# Patient Record
Sex: Female | Born: 1940 | Race: White | Hispanic: No | Marital: Married | Smoking: Never smoker
Health system: Southern US, Community
[De-identification: ages and names within clinical notes are randomized; demographics above are authoritative.]

## PROBLEM LIST (undated history)

## (undated) DIAGNOSIS — J189 Pneumonia, unspecified organism: Secondary | ICD-10-CM

## (undated) DIAGNOSIS — F341 Dysthymic disorder: Secondary | ICD-10-CM

## (undated) DIAGNOSIS — K219 Gastro-esophageal reflux disease without esophagitis: Secondary | ICD-10-CM

## (undated) DIAGNOSIS — I10 Essential (primary) hypertension: Secondary | ICD-10-CM

## (undated) DIAGNOSIS — M797 Fibromyalgia: Secondary | ICD-10-CM

## (undated) DIAGNOSIS — T4145XA Adverse effect of unspecified anesthetic, initial encounter: Secondary | ICD-10-CM

## (undated) DIAGNOSIS — I219 Acute myocardial infarction, unspecified: Secondary | ICD-10-CM

## (undated) DIAGNOSIS — I350 Nonrheumatic aortic (valve) stenosis: Secondary | ICD-10-CM

## (undated) DIAGNOSIS — G459 Transient cerebral ischemic attack, unspecified: Secondary | ICD-10-CM

## (undated) DIAGNOSIS — I5031 Acute diastolic (congestive) heart failure: Principal | ICD-10-CM

## (undated) DIAGNOSIS — R112 Nausea with vomiting, unspecified: Secondary | ICD-10-CM

## (undated) DIAGNOSIS — Z9889 Other specified postprocedural states: Secondary | ICD-10-CM

## (undated) DIAGNOSIS — I2510 Atherosclerotic heart disease of native coronary artery without angina pectoris: Secondary | ICD-10-CM

## (undated) DIAGNOSIS — F419 Anxiety disorder, unspecified: Secondary | ICD-10-CM

## (undated) DIAGNOSIS — Z86711 Personal history of pulmonary embolism: Secondary | ICD-10-CM

## (undated) DIAGNOSIS — G8929 Other chronic pain: Secondary | ICD-10-CM

## (undated) DIAGNOSIS — I214 Non-ST elevation (NSTEMI) myocardial infarction: Secondary | ICD-10-CM

## (undated) DIAGNOSIS — T8859XA Other complications of anesthesia, initial encounter: Secondary | ICD-10-CM

## (undated) DIAGNOSIS — C8590 Non-Hodgkin lymphoma, unspecified, unspecified site: Secondary | ICD-10-CM

## (undated) HISTORY — DX: Personal history of pulmonary embolism: Z86.711

## (undated) HISTORY — PX: ABDOMINAL HYSTERECTOMY: SHX81

## (undated) HISTORY — DX: Other chronic pain: G89.29

## (undated) HISTORY — DX: Morbid (severe) obesity due to excess calories: E66.01

## (undated) HISTORY — PX: CHOLECYSTECTOMY: SHX55

## (undated) HISTORY — DX: Gastro-esophageal reflux disease without esophagitis: K21.9

## (undated) HISTORY — PX: GASTRIC BYPASS: SHX52

## (undated) HISTORY — PX: BLADDER SUSPENSION: SHX72

## (undated) HISTORY — PX: CARPAL TUNNEL RELEASE: SHX101

## (undated) HISTORY — DX: Dysthymic disorder: F34.1

## (undated) HISTORY — PX: BACK SURGERY: SHX140

## (undated) HISTORY — DX: Essential (primary) hypertension: I10

## (undated) HISTORY — DX: Atherosclerotic heart disease of native coronary artery without angina pectoris: I25.10

---

## 1999-02-08 ENCOUNTER — Encounter: Payer: Self-pay | Admitting: Pulmonary Disease

## 1999-02-08 ENCOUNTER — Encounter (INDEPENDENT_AMBULATORY_CARE_PROVIDER_SITE_OTHER): Payer: Self-pay | Admitting: Specialist

## 1999-02-08 ENCOUNTER — Ambulatory Visit (HOSPITAL_COMMUNITY): Admission: RE | Admit: 1999-02-08 | Discharge: 1999-02-08 | Payer: Self-pay | Admitting: Pulmonary Disease

## 1999-04-05 ENCOUNTER — Ambulatory Visit (HOSPITAL_COMMUNITY): Admission: RE | Admit: 1999-04-05 | Discharge: 1999-04-05 | Payer: Self-pay | Admitting: Pulmonary Disease

## 1999-04-05 ENCOUNTER — Encounter: Payer: Self-pay | Admitting: Pulmonary Disease

## 2001-08-10 ENCOUNTER — Other Ambulatory Visit: Admission: RE | Admit: 2001-08-10 | Discharge: 2001-08-10 | Payer: Self-pay | Admitting: General Surgery

## 2001-11-09 ENCOUNTER — Inpatient Hospital Stay (HOSPITAL_COMMUNITY): Admission: EM | Admit: 2001-11-09 | Discharge: 2001-11-11 | Payer: Self-pay | Admitting: Emergency Medicine

## 2001-11-09 ENCOUNTER — Encounter: Payer: Self-pay | Admitting: Neurology

## 2001-11-09 ENCOUNTER — Encounter: Payer: Self-pay | Admitting: Emergency Medicine

## 2001-11-10 ENCOUNTER — Encounter: Payer: Self-pay | Admitting: Neurology

## 2002-11-23 ENCOUNTER — Emergency Department (HOSPITAL_COMMUNITY): Admission: EM | Admit: 2002-11-23 | Discharge: 2002-11-23 | Payer: Self-pay

## 2004-06-25 ENCOUNTER — Inpatient Hospital Stay (HOSPITAL_COMMUNITY): Admission: EM | Admit: 2004-06-25 | Discharge: 2004-06-26 | Payer: Self-pay | Admitting: Emergency Medicine

## 2006-05-05 ENCOUNTER — Ambulatory Visit: Payer: Self-pay | Admitting: Cardiology

## 2006-10-16 ENCOUNTER — Encounter: Payer: Self-pay | Admitting: Cardiology

## 2006-10-17 ENCOUNTER — Ambulatory Visit: Payer: Self-pay | Admitting: Cardiology

## 2006-10-22 ENCOUNTER — Inpatient Hospital Stay (HOSPITAL_BASED_OUTPATIENT_CLINIC_OR_DEPARTMENT_OTHER): Admission: RE | Admit: 2006-10-22 | Discharge: 2006-10-22 | Payer: Self-pay | Admitting: Cardiology

## 2006-10-22 ENCOUNTER — Ambulatory Visit: Payer: Self-pay | Admitting: Cardiology

## 2006-11-04 ENCOUNTER — Ambulatory Visit: Payer: Self-pay | Admitting: Cardiology

## 2007-09-07 ENCOUNTER — Ambulatory Visit: Payer: Self-pay | Admitting: Cardiology

## 2007-12-01 ENCOUNTER — Ambulatory Visit: Payer: Self-pay | Admitting: Cardiology

## 2008-09-23 ENCOUNTER — Ambulatory Visit: Payer: Self-pay | Admitting: Physical Medicine & Rehabilitation

## 2008-09-23 ENCOUNTER — Inpatient Hospital Stay (HOSPITAL_COMMUNITY)
Admission: RE | Admit: 2008-09-23 | Discharge: 2008-10-15 | Payer: Self-pay | Admitting: Physical Medicine & Rehabilitation

## 2008-10-08 ENCOUNTER — Encounter: Payer: Self-pay | Admitting: Cardiology

## 2008-11-10 ENCOUNTER — Encounter
Admission: RE | Admit: 2008-11-10 | Discharge: 2008-11-10 | Payer: Self-pay | Admitting: Physical Medicine & Rehabilitation

## 2009-06-21 ENCOUNTER — Encounter: Admission: RE | Admit: 2009-06-21 | Discharge: 2009-08-10 | Payer: Self-pay | Admitting: Family Medicine

## 2009-08-14 ENCOUNTER — Encounter: Admission: RE | Admit: 2009-08-14 | Discharge: 2009-09-01 | Payer: Self-pay | Admitting: Family Medicine

## 2009-09-27 ENCOUNTER — Encounter: Payer: Self-pay | Admitting: Cardiology

## 2009-09-27 ENCOUNTER — Ambulatory Visit: Payer: Self-pay | Admitting: Cardiology

## 2009-09-28 ENCOUNTER — Encounter: Payer: Self-pay | Admitting: Cardiology

## 2009-10-02 ENCOUNTER — Encounter: Payer: Self-pay | Admitting: Cardiology

## 2009-10-05 ENCOUNTER — Encounter: Payer: Self-pay | Admitting: Cardiology

## 2009-10-10 ENCOUNTER — Encounter: Payer: Self-pay | Admitting: Cardiology

## 2009-10-24 ENCOUNTER — Ambulatory Visit: Payer: Self-pay | Admitting: Cardiology

## 2009-10-24 DIAGNOSIS — E119 Type 2 diabetes mellitus without complications: Secondary | ICD-10-CM | POA: Insufficient documentation

## 2009-10-24 DIAGNOSIS — R0602 Shortness of breath: Secondary | ICD-10-CM

## 2009-10-24 DIAGNOSIS — J159 Unspecified bacterial pneumonia: Secondary | ICD-10-CM | POA: Insufficient documentation

## 2009-10-24 DIAGNOSIS — Z9884 Bariatric surgery status: Secondary | ICD-10-CM

## 2009-10-24 DIAGNOSIS — R0789 Other chest pain: Secondary | ICD-10-CM

## 2009-10-24 DIAGNOSIS — Z8679 Personal history of other diseases of the circulatory system: Secondary | ICD-10-CM

## 2009-10-24 DIAGNOSIS — R6521 Severe sepsis with septic shock: Secondary | ICD-10-CM

## 2009-10-24 DIAGNOSIS — J9600 Acute respiratory failure, unspecified whether with hypoxia or hypercapnia: Secondary | ICD-10-CM

## 2009-10-24 DIAGNOSIS — IMO0001 Reserved for inherently not codable concepts without codable children: Secondary | ICD-10-CM | POA: Insufficient documentation

## 2009-10-24 DIAGNOSIS — R930 Abnormal findings on diagnostic imaging of skull and head, not elsewhere classified: Secondary | ICD-10-CM | POA: Insufficient documentation

## 2009-10-24 DIAGNOSIS — I10 Essential (primary) hypertension: Secondary | ICD-10-CM | POA: Insufficient documentation

## 2009-10-24 DIAGNOSIS — A419 Sepsis, unspecified organism: Secondary | ICD-10-CM | POA: Insufficient documentation

## 2009-10-24 DIAGNOSIS — E7800 Pure hypercholesterolemia, unspecified: Secondary | ICD-10-CM

## 2009-10-24 DIAGNOSIS — G43909 Migraine, unspecified, not intractable, without status migrainosus: Secondary | ICD-10-CM | POA: Insufficient documentation

## 2009-10-24 DIAGNOSIS — E876 Hypokalemia: Secondary | ICD-10-CM

## 2009-10-24 DIAGNOSIS — R6520 Severe sepsis without septic shock: Secondary | ICD-10-CM

## 2009-11-20 ENCOUNTER — Encounter (INDEPENDENT_AMBULATORY_CARE_PROVIDER_SITE_OTHER): Payer: Self-pay | Admitting: *Deleted

## 2010-05-09 ENCOUNTER — Ambulatory Visit: Payer: Self-pay | Admitting: Cardiology

## 2010-09-11 NOTE — Letter (Signed)
Summary: Engineer, materials at [facility]  [address]   [place], [place] [address]   Phone: [phone]  Fax: [phone]        November 20, 2009 MRN: [id number]   [facility] [address] [place], [place]  [address]   Dear Ms. Siek,  Your test ordered by Selena Batten has been reviewed by your physician (or physician assistant) and was found to be normal or stable. Your physician (or physician assistant) felt no changes were needed at this time.  ____ Echocardiogram  ____ Cardiac Stress Test  ____ Lab Work  ____ Peripheral vascular study of arms, legs or neck  __X__ CT scan or X-ray - chest   ____ Lung or Breathing test  ____ Other:   Thank you.   Hoover Brunette, LPN    Duane Boston, M.D., F.A.C.C. Thressa Sheller, M.D., F.A.C.C. Oneal Grout, M.D., F.A.C.C. Cheree Ditto, M.D., F.A.C.C. Daiva Nakayama, M.D., F.A.C.C. Kenney Houseman, M.D., F.A.C.C. Jeanne Ivan, PA-C

## 2010-09-11 NOTE — Assessment & Plan Note (Signed)
Summary: 6 MO FU PER SEPT REMINDER-SRS   Visit Type:  Follow-up Primary Provider:  Margo Common   History of Present Illness: tpatient is 70 year old female with a history of chronic back pain status post multiple prior back surgeries. She has a history of a false-positive Cardiolite stress study. Prior catheterization showed no evidence of obstructive coronary artery disease and the patient had normal LV function. She has a history of hypertension and is treated appropriately. 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 There is associated shortness of breath or chest pain. She denies any orthopnea PND.  Preventive Screening-Counseling & Management  Alcohol-Tobacco     Smoking Status: never  Current Medications (verified): 1)  Metoprolol Tartrate 25 Mg Tabs (Metoprolol Tartrate) .... Take 1 Tablet By Mouth Twice A Day 2)  Alprazolam 0.5 Mg Tabs (Alprazolam) .... Take 1 Tablet By Mouth Four Times A Day 3)  Neurontin 300 Mg Caps (Gabapentin) .... Take 2 Tablet By Mouth Three Times A Day 4)  Coumadin 5 Mg Tabs (Warfarin Sodium) .... Use As Directed 5)  Warfarin Sodium 1 Mg Tabs (Warfarin Sodium) .... Use As Directed 6)  Fentanyl 50 Mcg/hr Pt72 (Fentanyl) .... Apply Patch Every 3days For Pain 7)  Promethazine Hcl 25 Mg Tabs (Promethazine Hcl) .... Take One By Mouth Every Six Hours As Needed Nausea 8)  Flintstones/extra C  Chew (Pediatric Multi Vit-Extra C-Fa) .... Take 1 Tablet By Mouth Once A Day 9)  Citalopram Hydrobromide 20 Mg Tabs (Citalopram Hydrobromide) .... Take 1 Tablet By Mouth Once A Day 10)  Omeprazole 40 Mg Cpdr (Omeprazole) .... Take 1 Tablet By Mouth Once A Day 11)  Methocarbamol 500 Mg Tabs (Methocarbamol) .... Take One By Mouth Every Six Hours As Needed Muscle Spasms 12)  Furosemide 20 Mg Tabs (Furosemide) .... Take 1-2 Tablet By Mouth Once A Day 13)  Aldactone 25 Mg Tabs (Spironolactone) .... Take 1 Tablet  By Mouth Two Times A Day 14)  Potassium Chloride Cr 10 Meq Cr-Tabs (Potassium Chloride) .... Take 1-2 Tablet By Mouth Once A Day 15)  Nitrostat 0.4 Mg Subl (Nitroglycerin) .... Dissolve One Tablet Under Tongue For Severe Chest Pain As Needed Every 5 Minutes, Not To Exceed 3 in 15 Min Time Frame  Allergies (verified): 1)  ! Codeine  Comments:  Nurse/Medical Assistant: The patient's medication bottles and allergies were reviewed with the patient and were updated in the Medication and Allergy Lists.  Past History:  Past Medical History: Last updated: 10/24/2009 nonobstructive coronary artery disease by catheterization in 2008 negative Myoview in 2007 history of diabetes mellitus remote history of TIA history of dizziness with carotid Dopplers in 2007 showing no significant carotid artery disease remote history of gastric bypass surgery hyperlipidemia hypertension fibromyalgia chronic back pain status post two prior back surgeries with diaphoresis deafness in left ear limited mobility status-post hysterectomy.  Past Surgical History: Last updated: 10/24/2009 back surgery Abdominal Hysterectomy-Total Cardiac Catheterization Cholecystectomy Hx of gastric bypass knee replacement surgery Carpal tunnel surgery urinary bladder suspension surgery  Social History: Last updated: 10/24/2009 Married  Alcohol Use - no Drug Use - no worked long time in a [employer]  Risk Factors: Smoking Status: never (05/09/2010)  Review of Systems       The patient complains of chest pain, shortness of breath, muscle weakness, and joint pain.  The patient denies fatigue, malaise, fever, weight gain/loss, vision loss, decreased hearing, hoarseness, palpitations, prolonged cough, wheezing, sleep  apnea, coughing up blood, abdominal pain, blood in stool, nausea, vomiting, diarrhea, heartburn, incontinence, blood in urine, leg swelling, rash, skin lesions, headache, fainting, dizziness, depression,  anxiety, enlarged lymph nodes, easy bruising or bleeding, and environmental allergies.    Vital Signs:  Patient profile:   70 year old female Height:      67 inches Weight:      239 pounds Pulse rate:   81 / minute BP sitting:   103 / 69  (left arm) Cuff size:   regular  Vitals Entered By: Carlye Grippe (May 09, 2010 2:12 PM)  Physical Exam  Additional Exam:  General: Well-developed, well-nourished in no distress head: Normocephalic and atraumatic eyes PERRLA/EOMI intact, conjunctiva and lids normal nose: No deformity or lesions mouth normal dentition, normal posterior pharynx neck: Supple, no JVD.  No masses, thyromegaly or abnormal cervical nodes lungs: crackles and rhonchi in the left chest.  Right-sided chest exam normal.  Normal percussion heart: regular rate and rhythm with normal S1 and S2, no S3 or S4.  PMI is normal.  No pathological murmurs abdomen: Normal bowel sounds, abdomen is soft and nontender without masses, organomegaly or hernias noted.  No hepatosplenomegaly musculoskeletal: Back normal,patient sitting in wheelchair.  Limited strength in lower extremities pulsus: Pulse is normal in all 4 extremities Extremities: No peripheral pitting edema neurologic: Alert and oriented x 3 skin: Intact without lesions or rashes cervical nodes: No significant adenopathy psychologic: Normal affect    Impression & Recommendations:  Problem # 1:  CHEST PAIN, ATYPICAL (ICD-786.59) chest pain is atypical and although the patient has nonobstructive coronary artery diseasedescription of present coronary spasm and as a  trial of given a prescription of supplemental nitroglycerin. The following medications were removed from the medication list:    Metoprolol Tartrate 100 Mg Tabs (Metoprolol tartrate) .Marland Kitchen... Take 1 tablet by mouth once a day in morning Her updated medication list for this problem includes:    Metoprolol Tartrate 25 Mg Tabs (Metoprolol tartrate) .Marland Kitchen... Take 1  tablet by mouth twice a day    Coumadin 5 Mg Tabs (Warfarin sodium) ..... Use as directed    Warfarin Sodium 1 Mg Tabs (Warfarin sodium) ..... Use as directed    Nitrostat 0.4 Mg Subl (Nitroglycerin) .Marland Kitchen... Dissolve one tablet under tongue for severe chest pain as needed every 5 minutes, not to exceed 3 in 15 min time frame  Problem # 2:  ESSENTIAL HYPERTENSION, BENIGN (ICD-401.1) blood pressure is well controlled. Continue current medical therapy The following medications were removed from the medication list:    Metoprolol Tartrate 100 Mg Tabs (Metoprolol tartrate) .Marland Kitchen... Take 1 tablet by mouth once a day in morning Her updated medication list for this problem includes:    Metoprolol Tartrate 25 Mg Tabs (Metoprolol tartrate) .Marland Kitchen... Take 1 tablet by mouth twice a day    Furosemide 20 Mg Tabs (Furosemide) .Marland Kitchen... Take 1-2 tablet by mouth once a day    Aldactone 25 Mg Tabs (Spironolactone) .Marland Kitchen... Take 1 tablet by mouth two times a day  Patient Instructions: 1)  Nitroglycerin as needed for severe chest pain  2)  Referral to Dr. Jeral Fruit 3)  Follow up in  1 year Prescriptions: NITROSTAT 0.4 MG SUBL (NITROGLYCERIN) dissolve one tablet under tongue for severe chest pain as needed every 5 minutes, not to exceed 3 in 15 min time frame  #25 x 3   Entered by:   Hoover Brunette, LPN   Authorized by:   Lewayne Bunting, MD, [facility]  Signed by:   Hoover Brunette, LPN on 16/05/9603   Method used:   Electronically to        [facility] [address] (retail)       [address]       [place], [place]  [address]       Ph: [phone] or [phone]       Fax: [phone]   RxID:   [id number]   Appended Document: 6 MO FU PER SEPT REMINDER-SRS Referral to Dr. Jeral Fruit already taken care of per patient.

## 2010-09-11 NOTE — Consult Note (Signed)
Summary: [facility]IOLOG[facility] CONSULT/[facility]  CARDIOLOGY CONSULT/[facility]   Imported By: Zachary George 10/24/2009 11:41:38  _____________________________________________________________________  External Attachment:    Type:   Image     Comment:   External Document

## 2010-09-11 NOTE — Assessment & Plan Note (Signed)
Summary: [facility] 2/18   Visit Type:  Follow-up Primary Provider:  Margo Common  CC:  follow-up visit.  History of Present Illness: the patient is a 70 year old female recently admitted with bronchitis and pneumonia.  The patient has no prior history of coronary disease.  She had a negative cardiac catheterization 2008.  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  The patient now presents for follow-up after recent hospitalization.  The patient still reports left-sided chest pain sometimes on inspiration.  She denies any substernal central chest pain.  She still reports some occasional chills.  She denies any shortness of breath orthopnea PND.  She denies a cough.  Preventive Screening-Counseling & Management  Alcohol-Tobacco     Smoking Status: never  Current Medications (verified): 1)  Glucophage 500 Mg Tabs (Metformin Hcl) .... Take 1 Tablet By Mouth Two Times A Day 2)  Metoprolol Tartrate 100 Mg Tabs (Metoprolol Tartrate) .... Take 1 Tablet By Mouth Once A Day in Morning 3)  Metoprolol Tartrate 25 Mg Tabs (Metoprolol Tartrate) .... Take 1 Tablet By Mouth Once A Day At Bedtime 4)  Alprazolam 0.5 Mg Tabs (Alprazolam) .... As Needed 5)  Neurontin 300 Mg Caps (Gabapentin) .... Take 1 Tablet By Mouth Two Times A Day 6)  Coumadin 4 Mg Tabs (Warfarin Sodium) .... Use As Directed 7)  Warfarin Sodium 5 Mg Tabs (Warfarin Sodium) .... Use As Directed 8)  Oxycodone Hcl 5 Mg Tabs (Oxycodone Hcl) .... Take One By Mouth Every Six Hours As Needed Pain 9)  Fentanyl 50 Mcg/hr Pt72 (Fentanyl) .... Apply Patch Every 3days For Pain 10)  Promethazine Hcl 25 Mg Tabs (Promethazine Hcl) .... Take One By Mouth Every Six Hours As Needed Nausea 11)  Flintstones/extra C  Chew (Pediatric Multi Vit-Extra C-Fa) .... Take 1 Tablet By Mouth Once A Day  Allergies (verified): 1)  ! Codeine  Comments:  Nurse/Medical Assistant: The patient's medications and  allergies were reviewed with the patient and were updated in the Medication and Allergy Lists. Verbally gave names and doses.  Past History:  Past Surgical History: Last updated: 10/24/2009 back surgery Abdominal Hysterectomy-Total Cardiac Catheterization Cholecystectomy Hx of gastric bypass knee replacement surgery Carpal tunnel surgery urinary bladder suspension surgery  Family History: Last updated: 10/24/2009 Noncontributory  Social History: Last updated: 10/24/2009 Married  Alcohol Use - no Drug Use - no worked long time in a [employer]  Risk Factors: Smoking Status: never (10/24/2009)  Past Medical History: nonobstructive coronary artery disease by catheterization in 2008 negative Myoview in 2007 history of diabetes mellitus remote history of TIA history of dizziness with carotid Dopplers in 2007 showing no significant carotid artery disease remote history of gastric bypass surgery hyperlipidemia hypertension fibromyalgia chronic back pain status post two prior back surgeries with diaphoresis deafness in left ear limited mobility status-post hysterectomy.  Social History: Smoking Status:  never  Review of Systems       The patient complains of chest pain.  The patient denies fatigue, malaise, fever, weight gain/loss, vision loss, decreased hearing, hoarseness, palpitations, shortness of breath, prolonged cough, wheezing, sleep apnea, coughing up blood, abdominal pain, blood in stool, nausea, vomiting, diarrhea, heartburn, incontinence, blood in urine, muscle weakness, joint pain, leg swelling, rash, skin lesions, headache, fainting, dizziness, depression, anxiety, enlarged lymph nodes, easy bruising or bleeding, and environmental allergies.    Vital Signs:  Patient profile:   70 year old female Height:      19  inches Weight:      239 pounds BMI:     37.57 Pulse rate:   90 / minute BP sitting:   125 / 80  (right arm) Cuff size:   large  Vitals  Entered By: Carlye Grippe (October 24, 2009 1:34 PM)  Nutrition Counseling: Patient's BMI is greater than 25 and therefore counseled on weight management options. CC: follow-up visit   Physical Exam  Additional Exam:  General: Well-developed, well-nourished in no distress head: Normocephalic and atraumatic eyes PERRLA/EOMI intact, conjunctiva and lids normal nose: No deformity or lesions mouth normal dentition, normal posterior pharynx neck: Supple, no JVD.  No masses, thyromegaly or abnormal cervical nodes lungs: crackles and rhonchi in the left chest.  Right-sided chest exam normal.  Normal percussion heart: regular rate and rhythm with normal S1 and S2, no S3 or S4.  PMI is normal.  No pathological murmurs abdomen: Normal bowel sounds, abdomen is soft and nontender without masses, organomegaly or hernias noted.  No hepatosplenomegaly musculoskeletal: Back normal,patient sitting in wheelchair.  Limited strength in lower extremities pulsus: Pulse is normal in all 4 extremities Extremities: No peripheral pitting edema neurologic: Alert and oriented x 3 skin: Intact without lesions or rashes cervical nodes: No significant adenopathy psychologic: Normal affect    Impression & Recommendations:  Problem # 1:  NONSPCIFC ABN FINDING RAD & OTH EXAM LUNG FIELD (ICD-793.1) the patient continues to have an abnormal lung exam.  Chest crackles and rhonchi on the left side.  She also still reports left-sided chest pain.  I have ordered a follow-up chest x-ray Orders: T-Chest x-ray, 2 views ([id number])  Problem # 2:  CHEST PAIN, ATYPICAL (ICD-786.59) the patient's chest pain is atypical and is no evidence that this is related to ischemia.  No further ischemia workup is needed currently The following medications were removed from the medication list:    Aspir-low 81 Mg Tbec (Aspirin) .Marland Kitchen... Take 1 tablet by mouth once a day Her updated medication list for this problem includes:    Metoprolol  Tartrate 100 Mg Tabs (Metoprolol tartrate) .Marland Kitchen... Take 1 tablet by mouth once a day in morning    Metoprolol Tartrate 25 Mg Tabs (Metoprolol tartrate) .Marland Kitchen... Take 1 tablet by mouth once a day at bedtime    Coumadin 4 Mg Tabs (Warfarin sodium) ..... Use as directed    Warfarin Sodium 5 Mg Tabs (Warfarin sodium) ..... Use as directed  Problem # 3:  ESSENTIAL HYPERTENSION, BENIGN (ICD-401.1) blood pressures controlled and will continue current medical regimen. The following medications were removed from the medication list:    Aspir-low 81 Mg Tbec (Aspirin) .Marland Kitchen... Take 1 tablet by mouth once a day Her updated medication list for this problem includes:    Metoprolol Tartrate 100 Mg Tabs (Metoprolol tartrate) .Marland Kitchen... Take 1 tablet by mouth once a day in morning    Metoprolol Tartrate 25 Mg Tabs (Metoprolol tartrate) .Marland Kitchen... Take 1 tablet by mouth once a day at bedtime  Patient Instructions: 1)  chest x-ray 2)  Follow up in  6 months

## 2010-09-11 NOTE — Consult Note (Signed)
Summary: [facility]ULMONARY [facility]SULT/ [facility]  [facility]UL[facility]ARY [facility]/ [facility]   Imported By: Zachary George 10/24/2009 11:10:37  _____________________________________________________________________  External Attachment:    Type:   Image     Comment:   External Document

## 2010-11-22 LAB — PROTIME-INR
INR: 2.6 — ABNORMAL HIGH (ref 0.00–1.49)
INR: 2.8 — ABNORMAL HIGH (ref 0.00–1.49)
Prothrombin Time: 29.7 seconds — ABNORMAL HIGH (ref 11.6–15.2)
Prothrombin Time: 31.9 seconds — ABNORMAL HIGH (ref 11.6–15.2)

## 2010-11-27 LAB — URINALYSIS, ROUTINE W REFLEX MICROSCOPIC
Bilirubin Urine: NEGATIVE
Nitrite: NEGATIVE
Specific Gravity, Urine: 1.018 (ref 1.005–1.030)
Urobilinogen, UA: 1 mg/dL (ref 0.0–1.0)
pH: 5 (ref 5.0–8.0)

## 2010-11-27 LAB — BASIC METABOLIC PANEL
BUN: 24 mg/dL — ABNORMAL HIGH (ref 6–23)
BUN: 32 mg/dL — ABNORMAL HIGH (ref 6–23)
BUN: 14 mg/dL (ref 6–23)
BUN: 24 mg/dL — ABNORMAL HIGH (ref 6–23)
CO2: 30 mEq/L (ref 19–32)
CO2: 31 mEq/L (ref 19–32)
CO2: 26 mEq/L (ref 19–32)
CO2: 28 mEq/L (ref 19–32)
CO2: 30 mEq/L (ref 19–32)
Calcium: 9.1 mg/dL (ref 8.4–10.5)
Calcium: 9.4 mg/dL (ref 8.4–10.5)
Calcium: 8.8 mg/dL (ref 8.4–10.5)
Calcium: 9 mg/dL (ref 8.4–10.5)
Calcium: 9.1 mg/dL (ref 8.4–10.5)
Chloride: 94 mEq/L — ABNORMAL LOW (ref 96–112)
Chloride: 99 mEq/L (ref 96–112)
Chloride: 101 mEq/L (ref 96–112)
Chloride: 99 mEq/L (ref 96–112)
Creatinine, Ser: 0.9 mg/dL (ref 0.4–1.2)
Creatinine, Ser: 1.34 mg/dL — ABNORMAL HIGH (ref 0.4–1.2)
Creatinine, Ser: 0.69 mg/dL (ref 0.4–1.2)
Creatinine, Ser: 0.77 mg/dL (ref 0.4–1.2)
Creatinine, Ser: 0.98 mg/dL (ref 0.4–1.2)
GFR calc Af Amer: 48 mL/min — ABNORMAL LOW (ref >60)
GFR calc Af Amer: >60 mL/min (ref >60)
GFR calc Af Amer: >60 mL/min (ref >60)
GFR calc Af Amer: >60 mL/min (ref >60)
GFR calc Af Amer: >60 mL/min (ref >60)
GFR calc non Af Amer: 40 mL/min — ABNORMAL LOW (ref >60)
GFR calc non Af Amer: >60 mL/min (ref >60)
GFR calc non Af Amer: 57 mL/min — ABNORMAL LOW (ref >60)
GFR calc non Af Amer: >60 mL/min (ref >60)
GFR calc non Af Amer: >60 mL/min (ref >60)
Glucose, Bld: 108 mg/dL — ABNORMAL HIGH (ref 70–99)
Glucose, Bld: 133 mg/dL — ABNORMAL HIGH (ref 70–99)
Glucose, Bld: 107 mg/dL — ABNORMAL HIGH (ref 70–99)
Glucose, Bld: 108 mg/dL — ABNORMAL HIGH (ref 70–99)
Glucose, Bld: 111 mg/dL — ABNORMAL HIGH (ref 70–99)
Potassium: 4.3 mEq/L (ref 3.5–5.1)
Potassium: 4.7 mEq/L (ref 3.5–5.1)
Potassium: 4 mEq/L (ref 3.5–5.1)
Potassium: 5.2 mEq/L — ABNORMAL HIGH (ref 3.5–5.1)
Sodium: 134 mEq/L — ABNORMAL LOW (ref 135–145)
Sodium: 137 mEq/L (ref 135–145)
Sodium: 134 mEq/L — ABNORMAL LOW (ref 135–145)
Sodium: 136 mEq/L (ref 135–145)
Sodium: 137 mEq/L (ref 135–145)

## 2010-11-27 LAB — CBC
HCT: 35.3 % — ABNORMAL LOW (ref 36.0–46.0)
Hemoglobin: 12.1 g/dL (ref 12.0–15.0)
Hemoglobin: 11.1 g/dL — ABNORMAL LOW (ref 12.0–15.0)
MCHC: 34.1 g/dL (ref 30.0–36.0)
MCHC: 34.4 g/dL (ref 30.0–36.0)
MCV: 90.5 fL (ref 78.0–100.0)
Platelets: 173 10*3/uL (ref 150–400)
Platelets: 139 10*3/uL — ABNORMAL LOW (ref 150–400)
RBC: 3.9 MIL/uL (ref 3.87–5.11)
RDW: 14.8 % (ref 11.5–15.5)
RDW: 14.1 % (ref 11.5–15.5)
WBC: 5 10*3/uL (ref 4.0–10.5)

## 2010-11-27 LAB — WOUND CULTURE

## 2010-11-27 LAB — COMPREHENSIVE METABOLIC PANEL
ALT: 12 U/L (ref 0–35)
AST: 22 U/L (ref 0–37)
Albumin: 3.1 g/dL — ABNORMAL LOW (ref 3.5–5.2)
Alkaline Phosphatase: 92 U/L (ref 39–117)
BUN: 25 mg/dL — ABNORMAL HIGH (ref 6–23)
CO2: 34 mEq/L — ABNORMAL HIGH (ref 19–32)
Calcium: 9.6 mg/dL (ref 8.4–10.5)
Chloride: 99 mEq/L (ref 96–112)
Creatinine, Ser: 1.36 mg/dL — ABNORMAL HIGH (ref 0.4–1.2)
GFR calc Af Amer: 47 mL/min — ABNORMAL LOW (ref >60)
GFR calc non Af Amer: 39 mL/min — ABNORMAL LOW (ref >60)
Glucose, Bld: 119 mg/dL — ABNORMAL HIGH (ref 70–99)
Potassium: 4.5 mEq/L (ref 3.5–5.1)
Sodium: 139 mEq/L (ref 135–145)
Total Bilirubin: 0.7 mg/dL (ref 0.3–1.2)
Total Protein: 5.9 g/dL — ABNORMAL LOW (ref 6.0–8.3)

## 2010-11-27 LAB — PROTIME-INR
INR: 2.4 — ABNORMAL HIGH (ref 0.00–1.49)
INR: 2.5 — ABNORMAL HIGH (ref 0.00–1.49)
INR: 2.8 — ABNORMAL HIGH (ref 0.00–1.49)
INR: 1.8 — ABNORMAL HIGH (ref 0.00–1.49)
INR: 2 — ABNORMAL HIGH (ref 0.00–1.49)
INR: 3.5 — ABNORMAL HIGH (ref 0.00–1.49)
Prothrombin Time: 27.3 seconds — ABNORMAL HIGH (ref 11.6–15.2)
Prothrombin Time: 28.4 seconds — ABNORMAL HIGH (ref 11.6–15.2)
Prothrombin Time: 31.7 seconds — ABNORMAL HIGH (ref 11.6–15.2)
Prothrombin Time: 21.6 seconds — ABNORMAL HIGH (ref 11.6–15.2)
Prothrombin Time: 22.2 seconds — ABNORMAL HIGH (ref 11.6–15.2)
Prothrombin Time: 24.2 seconds — ABNORMAL HIGH (ref 11.6–15.2)
Prothrombin Time: 27.6 seconds — ABNORMAL HIGH (ref 11.6–15.2)
Prothrombin Time: 35.3 seconds — ABNORMAL HIGH (ref 11.6–15.2)
Prothrombin Time: 38.3 seconds — ABNORMAL HIGH (ref 11.6–15.2)

## 2010-11-27 LAB — DIFFERENTIAL
Basophils Absolute: 0 10*3/uL (ref 0.0–0.1)
Basophils Relative: 1 % (ref 0–1)
Lymphocytes Relative: 43 % (ref 12–46)
Monocytes Absolute: 0.3 10*3/uL (ref 0.1–1.0)
Neutro Abs: 1.3 10*3/uL — ABNORMAL LOW (ref 1.7–7.7)
Neutrophils Relative %: 42 % — ABNORMAL LOW (ref 43–77)

## 2010-11-27 LAB — URINE CULTURE

## 2010-11-27 LAB — HEMOGLOBIN A1C
Hgb A1c MFr Bld: 5.8 % (ref 4.6–6.1)
Mean Plasma Glucose: 120 mg/dL

## 2010-11-27 LAB — GLUCOSE, CAPILLARY: Glucose-Capillary: 161 mg/dL — ABNORMAL HIGH (ref 70–99)

## 2010-12-25 NOTE — Discharge Summary (Signed)
[name] [name], [name] [name]               ACCOUNT NO.:  [id number]   MEDICAL RECORD NO.:  [id number]          PATIENT TYPE:  IPS   LOCATION:  4009                         FACILITY:  [facility]   PHYSICIAN:  Ranelle Oyster, M.D.DATE OF BIRTH:  November 19, 1940   DATE OF ADMISSION:  09/23/2008  DATE OF DISCHARGE:  10/15/2008                               DISCHARGE SUMMARY   DISCHARGE DIAGNOSES:  1. Cauda equina syndrome - L3-S1 fusion with postop hematoma.  2. Hypertension.  3. Pulmonary embolism.  4. Fibromyalgia.  5. Neuropathy.   HISTORY OF PRESENT ILLNESS:  Ms. [name] [name] is a 70 year old female  with history of hypertension, fibromyalgia, prior back surgeries x2, and  recent increase in back pain with radiation to right lower extremity  greater than left lower extremity with worsening of balance.  MRI of L-  spine showed multilevel DDD with stenosis and instability.  The patient  elected to undergo L3-S1 fusion by Dr. Burtis Junes at [facility] on January 21.  On January 22, the patient was noted to have increased weakness in  bilateral lower extremity secondary to wound hematoma requiring  evacuation the same day.  Postop, was placed on IV Kefzol for wound  prophylaxis.  The patient did develop PE, requiring IVC filter placed on  January 27.  Bilateral lower extremity Dopplers were negative for DVT.  Once stabilized, she was started on IV heparin and has been transitioned  to Coumadin.  Therapy evaluation done revealed the patient with  impairments in mobility and self care.  After rehab assessment on  September 22, 2008, it was felt that she would benefit from an inpatient  rehab program and she was admitted to rehab today.   PAST MEDICAL HISTORY:  Significant for fibromyalgia, hypertension,  anemia, cholecystectomy, hysterectomy, history of gastric bypass, lumbar  lam in 1970s and 1990s, morbid obesity, restless legs syndrome, history  of low back pain, right lower extremity weakness, history of  remote GI  bleed.   REVIEW OF SYMPTOMS:  Significant for back wound that is intact; weakness  and numbness, bilateral lower extremity; issues with constipation.   ALLERGIES:  CODEINE.   FAMILY HISTORY:  Positive for cancer, diabetes, and coronary artery  disease.   SOCIAL HISTORY:  The patient is married, is retired from a factory.  Lives in [place] in a mobile home with 3 steps at entry.  Does not use any  tobacco or alcohol.  Husband and daughter are supportive and can provide  supervision past discharge.   FUNCTIONAL HISTORY:  The patient was independent and driving prior to  admission.   FUNCTIONAL STATUS:  The patient is +2 total assist with knees brace to  stand, minimal-to-moderate assist x2 for sliding board transfers,  minimal assist for bed mobility, requires maximum assist for lower  extremity care with use of adaptive equipment.   PHYSICAL EXAMINATION:  VITAL SIGNS:  Blood pressure 124/84, pulse 78,  respiratory rate 16.  The patient is afebrile.  GENERAL: The patient is pleasant, obese female, alert and oriented x3,  in no acute distress.  HEENT:  Pupils equal, round, and reactive  to light and accommodation.  Noted to be hard of hearing.  Moist oral mucosa noted with a full set of  dentures.  NECK:  Supple without JVD or lymphadenopathy.  CHEST:  Clear to auscultation bilaterally without wheezes, rales, or  rhonchi.  HEART:  Regular rate and rhythm without murmurs or gallops.  EXTREMITIES:  No clubbing, cyanosis.  No edema noted.  SKIN:  Back incision noted to be intact with mild irritation along  incision line.  No obvious breakdown noted.  NEUROLOGIC: Cranial nerves II through XII show decreased hearing.  Reflex is decreased in both lower extremities.  Sensation is 1 to 2  inconsistently below the waist, seems to be most effected on at L4, L5,  and S1 dermatome.  Strength is 5/5 in upper extremity.  Left lower  extremity strength is 2/5 proximal at hip and at  knee she has 1+ to 2/5  with ankle plantar flexion, trace ankle dorsiflexion on left; right  ankle plantar flexion is 1/5 and 0/5 ankle dorsiflexion.  She has 3-/5  strength at right knee and 2/5 at right hip.  Judgment, orientation,  memory, and mood are within functional limits.   HOSPITAL COURSE:  Ms. [name] [name] was admitted to rehab on September 23, 2008, for inpatient therapies to consider PT and OT at least 3  hours, 5 days a week.  Past admission physiatrist, rehab RN, and therapy  team have worked together to provide customized collaborative  interdisciplinary care.  The patient's Coumadin was maintained due to a  history of PE.  Pharmacy has been following and assisting with routine  check of labs to monitor the patient's INR and adjustment of Coumadin  dosing.  The patient's PT/INR at the time of discharge is at 31.9 at 2.8  respectively, and the patient is discharged on 4 mg Coumadin a day.  The  patient's vitals were monitored on b.i.d. basis during this stay.  The  patient was noted to have issues with hypotension initially.  Labs of  February 17 revealed the patient to be slightly dehydrated with BUN at  24, creatinine at 0.98.  Additionally, she was noted to have some  hypokalemia with potassium at 5.2.  The patient was treated with  Kayexalate.  Her lisinopril was discontinued and she was also started on  IV fluids to help with hydration issues and to help with hypotension  issues.  Additionally, the patient's Demadex was discontinued and TEDs  are being used for lower extremity edema control.  Due to continued  issues with low blood pressure, metoprolol was initially placed on hold.  As the patient's blood pressure stabilized out, IV fluids was changed to  nightly from February 17 to February 22.  At this time, blood pressures  were noted to be improved ranging from 90s to 110s in systolics.  As the  patient's mobility has improved, BP and heart rate were noted to be  on  an upward trend with heart rates in 80s-90s and systolics rising to  140s.  The patient's metoprolol has been resumed and has been slowly  titrated upwards to 50 mg b.i.d.Marland Kitchen  Blood pressures at the time of  discharge, ranging from 120s-150s systolics, 60s-80s diastolic.  Last  weight is at 111 kg.   Check of lytes, last of February 18, revealed sodium 136, potassium 4,  chloride 101, CO2 of 28, BUN 14, creatinine 0.7, glucose 108.  The  patient's incision has been monitored long closely by rehab RN.  She was  noted to develop some drainage with some redness and odor reported on  February 26.  She was noted to have some serosanguineous drainage, which  was cultured.  She was also started on Keflex 500 mg p.o. q.i.d.  empirically.  Temperature curve has been monitored long and she has had  no febrile episodes.  Additionally, a CBC with diff was checked on  February 27, showing hemoglobin 11.1, hematocrit 32.3, white count 3.1,  platelets 139, lymphocytes 43.  The patient's wound culture grew out a  few Staph aureus.  As the patient's wound drainage resolved within 48  hours, Keflex was discontinued on March 1.  The patient's wound remains  intact and currently is clean and dry without any evidence of erythema.   During the patient's stay in rehab, weekly team conferences were held to  monitor the patient's progress, set goals, as well as discuss barriers  to discharge.  Rehab RN has been assisting with bowel program.  The  patient was noted to have issues with constipation.  She was started on  MiraLax as well as FiberCon for bulking with suppositories and dig stim  being used every other day.  The patient's rehab RN has educated family  regarding bowel care, and they are comfortable in administering and  performing dig stim needed to help with bowel evacuation.  As mobility  improved, Foley was discontinued and bladder functioning was monitored  with PVR checks.  Initially, the  patient was noted to have problems  voiding, requiring in and out cath; however, within 48 hours, the  patient was noted to be voiding without any evidence of retention.  The  patient was noted to have issues with pain management at the time of  admission.  She reported having pain in 5-6 range most of the time.  Duragesic patch was added to help with more consistent pain relief.  Additionally, due to issues with neuropathy and spasms, Neurontin as  well was added and titrated to 300 mg t.i.d. as well as Pamelor added 10  mg nightly to help with her symptomatology.  She did report some issues  with fatigue and decreased endurance level initially with increasing  dose of Neurontin and has been instructed regarding side effect of meds  and the need to balance side effect with pain management.  The patient's  fentanyl patch was increased to 50 mcg an hour on February 27 due to  continued issues with pain.  The patient has been able to tolerate this  increase without any excessive sedation.  The patient's family has been  supportive and has been able to go through multiple family education  sessions with PT and OT.  At the time of admission, the patient was  noted to be impaired by decreased strength, decreased sensation, issues  with pain, as well as poor endurance.  She was total assist +2 for all  function, mobility, and transfers.  Physical therapy has worked with the  patient in helping increase endurance level with the use of knee step  for upper and lower body strengthening.  They have also been working on  standing and light gait as well as weight shifting and advancing of  lower extremity.  Core stabilization activities were also done sitting  at the edge of wheelchair for bouncing and as well as catching balls of  rebounder.  The patient has occasionally been limited by issues with  pain; however, rehab RN and therapy team have worked together  to  premedicate the patient prior to  therapy in these instances.  At the  time of discharge, the patient is modified independent for wheelchair  navigation.  She is at supervision level for supine to sitting.  She  requires moderate assist for sitting to supine.  She is at supervision  level for bed to wheelchair transfers.  She is able to perform car  transfers with sliding board.  Family education was done with husband as  well as daughter in safety with mobility equipment use, transfer  training.  Family has built a ramp for home entry and exit, and the  patient is able to navigate ramp with minimal assist.  A hospital bed  was also ordered to help assist with transfers and decrease caregiver  burden.  OT has been working with the patient regarding self care as  well as toileting.  The patient was noted to be at moderate assist for  toilet transfers, maximum assist for clothing management, and noted to  have some issues with self care initially.  ADL retraining has focused  at edge of bed on clothing management, with lateral leans for lower body  clothing.  The patient is currently able to use adaptive equipment to  perform grooming at sink level with supervision for transfers.  She is  able to focus on simple kitchen activities requiring minimal assist to  reach items of full shelf.  She does continue to require minimal assist  to pull up pants.  She is able to direct husband in her needs and in use  of adaptive equipment.  The patient continues to have difficulty with  toileting and often has chosen to use bedpan over transferring to  bedside commode and tub bench cutout.  Family will be able to provide  assistance as needed for self care.  The patient will additionally  continue to receive further followup home health, PT, OT by [facility].  Home health RN has been arranged for protime draws  with next protime to be checked on March 08, with results to Dr. Margo Common.  On October 15, 2008, the patient is  discharged to home with supervision  assistance to be provided by family.   DISCHARGE MEDICATIONS:  1. Coumadin 2 mg 2 p.o. q.p.m.  2. MiraLax 1 scoop in 8 ounces q.a.m.  3. Folic acid 1 mg p.o. per day.  4. Vitamin D 400 units 1 per day.  5. Mylicon 80 mg t.i.d. with meals.  6. Prilosec 40 mg per day.  7. Neurontin 300 mg p.o. q.8 h.  8. Lopressor 50 mg b.i.d.  9. FiberCon 2 pills per day in a.m.  10.Pamelor 10 mg 1 p.o. nightly.  11.Duragesic patch 50 mcg an hour, change q.72 h.  12.Robaxin 500 mg 1 p.o. q.6 h. p.r.n. spasms.  13.Oxycodone IR 5 mg 1 to 2 p.o. q.6-8 h. p.r.n. pain #90 RX.   DIET:  Regular.   WOUND CARE:  Wash area with soap and water.  Keep area clean and dry.   ACTIVITIES:  A 24 hours supervision.  No strenuous activity.  No  alcohol, no smoking, no driving.   SPECIAL INSTRUCTIONS:  Do not use torsemide or hydrocodone.  Dulcolax  suppository every other evening with dig stim.  [facility] to  provide PT, OT, and RN.   FOLLOWUP:  The patient to follow up with Dr. Riley Kill, April 7 at 9:45  a.m. or 10:00 a.m.  Follow up with Dr.  Sweasey for postop check.  Follow  up with Dr. Margo Common for routine check in 2 weeks.      Greg Cutter, P.A.      Ranelle Oyster, M.D.  Electronically Signed    PP/MEDQ  D:  10/14/2008  T:  10/15/2008  Job:  [id number]   cc:   Bartholomew Crews  Wyvonnia Lora

## 2010-12-25 NOTE — H&P (Signed)
[name] [name], [name] [name]               ACCOUNT NO.:  [id number]   MEDICAL RECORD NO.:  [id number]          PATIENT TYPE:  IPS   LOCATION:  4009                         FACILITY:  [facility]   PHYSICIAN:  Ranelle Oyster, M.D.DATE OF BIRTH:  1941-06-23   DATE OF ADMISSION:  09/23/2008  DATE OF DISCHARGE:                              HISTORY & PHYSICAL   CHIEF COMPLAINT:  Low back pain and leg weakness.   HISTORY OF PRESENT ILLNESS:  This is a 71 year old female with  hypertension and fibromyalgia with back surgeries x2 with recent  increase in back pain radiation into the right greater than left lower  extremities.  MRI of the spine revealed multiple level degenerative disk  disease with stenosis and instability.  She underwent L3-S1 fusion by  Dr. Burtis Junes at the [facility] on September 01, 2008.  On September 02, 2008, the patient with increased weakness of bilateral lower extremity  secondary to a hematoma requiring evacuation on the same day.  The  patient was placed on Kefzol postoperatively for wound prophylaxis.  The  patient developed a pulmonary embolism requiring IVC filter on September 07, 2008.  Lower extremity Dopplers were negative for DVT.  She has been  placed on IV heparin and transitioned to Coumadin.  The patient  continued to struggle with mobility and self-care.  She still has a  Foley catheter in place.  It was felt after rehab assessment on September 22, 2008, that she could benefit from rehab, and the patient was brought  here today.   REVIEW OF SYSTEMS:  Notable for leg pain and low back pain.  She has  numbness in her legs and weakness overall.  She reports some shortness  of breath.  Appetite is fair.  She denies any chest pain.  Sleep has  been good except for pain problems at night.  Full 14-point review is in  the written history and physical.   PAST MEDICAL HISTORY:  Positive for:  1. Fibromyalgia.  2. Hypertension.  3. Anemia.  4. Cholecystectomy.  5.  Hysterectomy.  6. Gastric bypass.  7. Lumbar laminectomy in the 70s and 90s.  8. Morbid obesity.  9. Restless legs syndrome.  10.Low back pain with right lower extremity weakness.  11.GI bleed.   FAMILY HISTORY:  Positive for cancer, diabetes, and CAD.   SOCIAL HISTORY:  The patient is married, retired from a factory, and  lives in [place] in a mobile home with 3 steps to enter.  Husband can  provide supervision at discharge.   ALLERGIES:  CODEINE.   HOME MEDICATIONS:  Xanax, Vicodin, metoprolol, aspirin, vitamin D, folic  acid, torsemide, Neurontin, and iron supplementation.   PHYSICAL EXAMINATION:  VITAL SIGNS:  Blood pressure is 124/84, pulse is  78, and respiratory rate 16.  She is afebrile.  GENERAL:  The patient is pleasant, lying in bed, alert and oriented x3.  HEENT:  Pupils are equally round and reactive to light and  accommodation.  Ear, nose, and throat exam is notable for pink mucosa  with full dentures.  NECK:  Supple without  JVD or lymphadenopathy.  CHEST:  Clear to auscultation bilaterally without wheezes, rales, or  rhonchi.  HEART:  Regular rate and rhythm without murmur, rubs, or gallops.  EXTREMITIES:  No clubbing, cyanosis, or edema.  ABDOMEN:  Soft, nontender.  Bowel sounds are positive.  SKIN:  Notable for intact back incision without any drainage and mild  irritation along the line of the wound.  I saw no obvious breakdown in  the heels, buttock area, etc.  NEUROLOGIC:  Cranial nerves II-XII are grossly intact.  Reflexes are  decreased in both legs.  Sensation is 1/2 inconsistently below the  waist.  She seems to be most affected along the L4-L5 and S1 dermatomes.  Strength is 5/5 in the upper extremities.  Left lower extremity strength  is 2/5 proximal at the hip and knee.  She has 1+ to 2/5 with ankle  plantar flexion, trace ankle dorsiflexion on the left.  Right ankle  plantar flexion is 1/5 with 0/5 ankle dorsiflexion.  She had 3 minus/5  strength at  the knee and 2/5 strength at the hip.  Judgment,  orientation, memory, mood were all within functional limits.  I might  add that she is slightly hard of hearing.   POST ADMISSION PHYSICIAN EVALUATION:  1. Functional deficits secondary to lumbar stenosis status post L3-S1      fusion with postoperative hematoma and likely cauda equina      syndrome.  2. The patient is admitted to receive collaborative interdisciplinary      care between the physiatrist, rehab, nursing staff, and therapy      team.  3. The patient's level of medical complexity and substantial therapy      needs in context of that medical necessity cannot be provided at a      lesser intensity of care.  4. The patient  has experienced substantial functional loss from her      baseline.  Prior to arrival, the patient was independent with a      cane for all function except occasional lower body dressing.  As of      the rehab preadmission screening, the patient is min assist bed      mobility, mod assist sliding board transfers, max assist sit to      stand with wheelchair or transfers.  Min assist upper body      dressing, total assist lower body dressing, min assist grooming.      Based on the diagnosis, physical exam, and functional history, the      patient has displayed the ability to make functional progress,      which result in measurable gains while on inpatient rehab.  These      gains will be of substantial and practical use upon discharge to      home where she will have supervision of her husband.  Interim      changes in the medical history since our screening are noted above.  5. Physiatrist will provide 24-hour management of medical needs, as      well as oversight of therapy plan/treatment, and provide guidance      as appropriate regarding interaction of the 2.  Medical problem      list and plan are listed below.  6. 24-hour rehab nursing will assist in the management of the      patient's bowel and  bladder continence.  She likely will need to be      put on bowel program  and need a intermittent cathing, scheduled      initially as we tried to improve her bladder function.  Nursing      will also help with skin care needs, as well as pain management,      nutrition, and integration of therapy concept, techniques, and      education.  7. PT will assess and treat for lower extremity strength, stability,      appropriate adaptive equipment, orthotics, pre gait and possibly      gait training and other functional mobility.  Goals are supervision      to occasional min assist with mobility.  8. OT will assess and treat for upper extremity use and adaptive      techniques, as well as safety measures, family education, and      implementation of ADLs.  Goals are supervision to occasional min      assist.  9. Case management/social worker will assess and treat for      psychosocial issues and discharge planning.  10.Team conference will be held weekly to assess progress towards      goals and to determine barriers at discharge.  11.The patient has had demonstrated sufficient medical stability and      exercise capacity to tolerate at least 3 hours of therapy per day      at least 5 days per week.  12.Estimated length of stay is 3 weeks.  Prognosis is good.   MEDICAL PROBLEM LIST AND PLAN:  1. Hypertension:  We will monitor blood pressure with therapy while on      Prinivil, Lopressor, and Demadex.  2. Fibromyalgia:  Continue vitamin D and folic acid as per baseline.  3. Pulmonary embolism:  Continue Coumadin with last INR therapeutic at      2.5.  Check INR upon admission.  4. Acute on chronic anemia:  We will check lab work on Monday versus      tomorrow.  No active signs of bleeding currently.  5. 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  Continue Vicodin for breakthrough symptoms.   6. Skin care:  Continue routine turning and observation.  7. Bowels:  Senokot 2 nightly with Dulcolax suppository q.p.m., as      well as needed for no BM.  Fleet enema if no movement with      suppository.      Ranelle Oyster, M.D.  Electronically Signed     ZTS/MEDQ  D:  09/23/2008  T:  09/24/2008  Job:  [id number]

## 2010-12-28 NOTE — H&P (Signed)
[name] [name], [name] [name]               ACCOUNT NO.:  [id number]   MEDICAL RECORD NO.:  [id number]          PATIENT TYPE:  INP   LOCATION:  A223                          FACILITY:  [facility]   PHYSICIAN:  Margaretmary Dys, M.D.DATE OF BIRTH:  [birth year]   DATE OF ADMISSION:  06/25/2004  DATE OF DISCHARGE:  LH                                HISTORY & PHYSICAL   ADMISSION DIAGNOSIS:  1.  Slurred speech.  2.  Transient ischemic attack.  3.  Poorly controlled hypertension.  4.  Morbid obesity.  5.  Dyslipidemia.   CHIEF COMPLAINT:  Slurred speech with right-sided facial numbness today.   HISTORY OF PRESENT ILLNESS:  [name] [name] is a 70year-old Caucasian female  who presented to the emergency room with complaints of slurred speech and  facial numbness on the right side of her face.  The patient says she has had  multiple episodes in the past and was told that she had transient ischemic  attack.  She reports that she also had some decreased taste in the back of  her throat when this attack occurs.  She has some difficulty swallowing.  She denies any headaches, dizziness or lightheadedness.  No nausea,  vomiting, abdominal pain or constipation.  She denies any syncopal attacks.  She said the symptoms were waxing and waning over several hours.  Once she  arrived in the emergency room, she said it was much better.  When her  daughter was present in this interview who saw her, she noticed that her  face was also pulled out to the right.  The patient denies chewing more on  the left side of her face.  She denies any drooling from the right side of  the mouth.  The patient does not have any insurance and has had difficulty  obtaining her medications.  She has not used any of her medications over the  last two weeks and she actually does not have a full list.  She did bring  some empty bottles of prescription medications.   She denies any fevers, chills or rigor.  She has no cough, no chest pain,  no  shortness of breath, no petechial edema.  No palpitations.   Evaluation in the emergency room revealed a negative CT of head.  There has  been no further workup.   REVIEW OF SYSTEMS:  A 10-point review of systems was negative except as  mentioned in the history of the present illness above.   PAST MEDICAL HISTORY:  1.  Hypertension.  2.  Dyslipidemia.  3.  Morbid obesity.  4.  History of recurrent transient ischemic attacks.  5.  History of multiple back surgeries.  6.  Myocardial infarction.  7.  Status post cholecystectomy.  8.  History of hysterectomy.   MEDICATIONS:  1.  She is a folic acid 1 mg p.o. daily.  2.  TriCor 48 mg p.o. daily.  3.  Nortriptyline 50 mg p.o. q.h.s.  4.  Xanax daily.  5.  Tylox.  6.  Aspirin 81 mg p.o. daily.  Note that the patient is poorly compliant.  ALLERGIES:  CODEINE (chest pain).   FAMILY HISTORY:  Noncontributory.   SOCIAL HISTORY:  The patient denies any use of tobacco products.  She does  not drink alcohol.  She currently lives with one of her children.  She has a  total of two children.  She is a retired Scientist, product/process development.   PHYSICAL EXAMINATION:  GENERAL:  She is alert.  She was comfortable, not in  acute distress.  She was alert and oriented to time, place and person.  VITAL SIGNS:  Blood pressure 159/69, pulse 78, respiratory rate of 20.  Pain  scale was 0/10  Oxygen saturation at 100% on room air.  Temperature was 97.5  degrees.  HEENT:  Atraumatic, normocephalic.  Oral mucosa was moist.  The patient has  a mild right facial droop.  No thrush was noted.  NECK:  Supple.  No JVD, no lymphadenopathy.  No carotid bruits were heard.  LUNGS:  Clear bilaterally.  HEART:  S1 and S2, regular rate and rhythm without murmurs, gallops or rubs.  ABDOMEN:  Obese, soft, nontender.  Bowel sounds were positive.  EXTREMITIES:  No pedal edema.  No calf tenderness was noted.  CENTRAL NERVOUS SYSTEM:  She is conscious and alert.  She had what  appeared  a right facial droop. There is decreased sensation on the right side of the  face.  The patient has not had any weakness in the extremities.  She has had  no pronator drift.  Reflexes are 2+ in all extremities.  Babinski was  negative.   LABORATORY DATA:  The patient had diagnostic studies done.  Cardiac enzymes  were negative.  White blood cell count 6.1, hemoglobin 10.6, hematocrit  31.4, platelet count 245,000; there was no left shift.  Sodium 141,  potassium 4.1, chloride 109, carbon dioxide 28, glucose of 107, BUN of 4.  Creatinine 0.8, calcium 9.1, total protein 6, albumin 3.6.  AST was 34, ALT  24, alkaline phosphatase 83, total bilirubin 0.2.  PT 12.5, INR 0.9.  Head  CT scan was negative.  Spiral chest CT was negative.  D-dimer was positive  at 1.64.   ASSESSMENT:  [name] [name] is a 70 year old Caucasian female who presented to  the emergency room with symptoms and signs fairly consistent with a  transient ischemic attack.  The patient has been poorly compliant with her  medications, especially aspirin which she has not taken for two weeks or  probably longer.  The patient has had attacks in the past and was advised to  stay on the aspirin.   PLAN:  The patient will be admitted to the telemetry unit at this time for  closer monitoring.  We will keep her on telemetry continuously.  EKG shows  normal sinus rhythm with no acute ST changes although the patient has old  inferior wall infarcts.  We will continue telemetry on her and keep her on  IV fluids, normal saline at 75 mL/H with clear liquids. If the patient shows  any evidence of aspiration, we will request a swallow study.   We will obtain a 2-D echocardiogram in the morning and also bilateral  carotid Dopplers.  She will also receive an MRI/MRA.  We will resume all the  patient's prior home medications at this time.  I will increase her aspirin to 325 mg p.o. daily.  DVT prophylaxis with Lovenox 40 mg subcu  daily.   I discussed the above plan with the patient who verbalized full  understanding.  The patient informed me that she has significant financial  difficulties and is hoping that a referral can be made to a Child psychotherapist.     Ayor   AM/MEDQ  D:  06/26/2004  T:  06/26/2004  Job:  [id number]

## 2010-12-28 NOTE — Discharge Summary (Signed)
[facility]  Patient:    [name] [name], [name] [name] Visit Number: [id number] MRN: [id number]          Service Type: MED Location: [id number] Attending Physician:  Erich Montane Dictated by:   Genene Churn. Love, M.D. Admit Date:  11/09/2001 Disc. Date: 11/11/01   CC:         Dr. Wyvonnia Lora   Discharge Summary  PATIENTS ADDRESS:  [address], [place], [place]  [address].  DATE OF BIRTH:  Dec 19, 1940.  REASON FOR ADMISSION:  This was the first [place] admission for this 70 year old right-handed married white female from [place], [place], admitted from the emergency room for evaluation of right perioral facial numbness, dysarthria and right facial weakness.  HISTORY OF PRESENT ILLNESS:  Ms. Gossard as been followed by Dr. Meredith Pel, neurologist at [facility], for many years, with a history of symptoms that now occur at a frequency of twice per year.  These are usually associated with facial numbness, dysarthria and facial weakness. They can last several days.  She has also had episodes of fluttering in her chest, thought to represent supraventricular tachycardia and by history, the possibility of atrial fibrillation though I have no documentation. She was on Coumadin for about two years up until five years ago and has not been on that medication since. She has a long history of suspected migraine headaches and a positive family history of headaches in that her daughter has migraine.  Two days prior to admission she developed right perioral and lower lip numbness associated with dysarthria and the night prior to admission, right facial weakness witnessed by her family and came to the emergency room on November 09, 2001, for evaluation.  PAST MEDICAL HISTORY:  This is significant for pain occurring in her back and both legs bilaterally. She had pain in her hips but does not use a cane and does not  use a walker.  She has had right and left carpal tunnel syndrome surgery, obesity, TIAs versus migraine, lumbar disc surgery x2 by Dr. Tresa Endo, a neurosurgeon at [facility].  Gastric bypass surgery 18 years ago. Question of supraventricular tachycardia versus atrial fibrillation five years ago but I do not have documentation.  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  ADMISSION MEDICATIONS: 1. No aspirin. 2. Xanax 0.5 mg q.i.d. 3. Toprol XL 50 mg q.d. 4. Vicodin three to four per day. 5. Tylenol two every other day for headache.  SOCIAL HISTORY:  She does not smoke cigarettes. She does not drink alcohol. She finished the ninth grade in school.  FAMILY HISTORY:  This is remarkable for diabetes mellitus, myocardial infarction, migraine headaches and a sister with fibromyalgia.  MEDICAL REVIEW OF SYSTEMS:  This is significant in that the patient has had obesity. She has had pain syndrome thought to represent fibromyalgia.  EXAMINATION PHYSICAL EXAMINATION:  VITAL SIGNS:  Blood pressure as high as 200/100 in the right arm and 200/110 in the left arm but she was somewhat anxious.  Otherwise, her neurologic examination was nonfocal except for decreased hearing bilaterally with bone conduction greater than air conduction.  LABORATORY STUDIES:  CAT scan with no evidence of abnormalities. There was a _____________ space in left basal ganglia region.  Hemoglobin 11.2, hematocrit 34.5, white blood cell count 5400, platelet count 224,000.  Differential on white blood cell count was 56% polys, 35% lymphs, 7% monocytes, 2%  eosinophils, 1% basophils.  Protime was 13.0 with INR of 1.0 and PTT was 23.  Serum sodium 140, potassium 4.0, chloride 107, CO2 content 28, glucose 116, BUN 6, creatinine 0.7, calcium 8.8.  Total protein and liver function tests were all unremarkable and a urinalysis was unremarkable.  EKG showed normal sinus rhythm, low  voltage QRS.  Telemetry showed normal sinus rhythm in the hospital.  She had some fluid in her sphenoid sinus on a CT scan but otherwise was normal study.  MRI study of the brain and intracranial MRA on November 10, 2001, was normal. Doppler study of the carotids showed no evidence of ICA stenosis and vertebral flow antegrade.  HOSPITAL COURSE: The patient had some numbness in the hospital with symptoms suggestive that this may be migraine related.  She had no worsening of her neurologic examination in the hospital.  A 2-D echocardiogram was not performed since her findings were thought not to be related to a heart problem with normal EKG in the hospital.  There was no evidence of any postural hypotension documented in the hospital.  IMPRESSION:  1. Right facial numbness, code 782.0.  2. Dysarthria, code 784.5.  3. Right facial weakness, code 434.01.  4. Headaches, code 784.0.  5. Obesity, code 278.01.  6. Fibromyalgia, code 729.1.  7. Back pain, status post two lumbar laminectomies; code 724.4.  8. Bilateral hip pain, code 719.45.  9. Gait disorder, code 781.2. 10. Decreased hearing, conductive type; code 389.00. 11. Possible history of atrial fibrillation in the past, code 437.301.  PLAN:  Discharge her on her admission medicines plus aspirin. I am considering the possibility of a migraine preventative as an outpatient but she does give a history of some renal stones, possibly in the past; therefore, I am hesitant to use Topamax at this time.  She is discharged improved from prehospital status and return on a p.r.n. basis should she have difficulties. Dictated by:   Genene Churn. Love, M.D. Attending Physician:  Erich Montane DD:  11/11/01 TD:  11/11/01 Job: [id number]

## 2010-12-28 NOTE — Procedures (Signed)
[name], [name] [name]               ACCOUNT NO.:  [id number]   MEDICAL RECORD NO.:  [id number]          PATIENT TYPE:  INP   LOCATION:  A223                          FACILITY:  [facility]   PHYSICIAN:  Dani Gobble, MD       DATE OF BIRTH:  10/11/1940   DATE OF PROCEDURE:  DATE OF DISCHARGE:  06/26/2004                                  ECHOCARDIOGRAM   REFERRING PHYSICIAN:  Dr. Orvan Falconer.   INDICATION:  [name] [name] is a 70 year old female who was admitted with a  possible stroke.   The technical quality of the study is reasonable.   The aorta is within normal limits at 2.8 cm.   The left atrium is borderline dilated at 4.2 cm.  No obvious clots or masses  were appreciated, and the patient appeared to be in sinus rhythm during this  procedure.   The interventricular septum and posterior wall were mild to moderately  thickened at 1.7 cm and 1.4 cm, respectively.   The aortic valve was not well visualized, but appeared to be trileaflet with  normal leaflet excursion.  It was mildly thickened, but without restriction  to leaflet opening.  No significant aortic insufficiency was noted.  Doppler  interrogation of the aortic valve is within normal limits.   The mitral valve also appears minimal thickened, but with normal leaflet  excursion.  No mitral valve prolapse is noted.  Trace to mild mitral  regurgitation was note.  Doppler interrogation of the mitral valve was  within normal limits.   The pulmonic valve was incompletely visualized but appeared to be grossly  structurally normal with trivial pulmonic insufficiency noted.   The tricuspid valve was not well visualized.  Mild tricuspid regurgitation  was noted.   The left ventricle was normal in size with the LVIDD measured at 4.3 cm and  the LVISD measured at 3.2 cm.  Overall, left ventricular systolic function  appeared normal and no obvious regional wall motion abnormalities were  appreciated.  The presence of diastolic  dysfunction is inferred from pulse  wave Doppler across the mitral valve.   The right atrium appeared normal in size, and the right ventricle appeared  mildly dilated with a suggestion of mild right ventricular hypertrophy.  Right ventricular systolic function was normal.   The interatrial septum is not well visualized, but there is no obvious  aneurysm, nor is there gross PFO or ASD noted.   IMPRESSION:  1.  Mild to moderate concentric left ventricular hypertrophy.  2.  Borderline dilatation of the left atrium.  3.  The patient appeared to be in sinus rhythm during this procedure.  4.  Trace to mild mitral regurgitation.  5.  Mild tricuspid regurgitation.  6.  Trivial pulmonic insufficiency.  7.  Normal left ventricular size and systolic function without regional wall      motion abnormality noted.  8.  The presence of diastolic dysfunction is inferred from pulse wave      Doppler across the mitral valve.  9.  The right ventricle appeared mildly dilated, but with normal right  ventricular systolic function and a suggestion of right ventricular      hypertrophy.  10. The interatrial septum is not well visualized, but there is no obvious      aneurysm, nor is there gross PFO or ASD noted.      AB/MEDQ  D:  06/26/2004  T:  06/26/2004  Job:  [id number]   cc:   Vania Rea, M.D.

## 2010-12-28 NOTE — Assessment & Plan Note (Signed)
[name]                          [name] [name]   NAME:[name], [name] FLICKER                      MRN:          [id number]  DATE:11/04/2006                            DOB:          08/16/1940    SUBJECTIVE:  [name] [name] for followup regarding recent  cardiac catheterization which took place on October 22, 2006, by Dr. Rollene Rotunda.  The patient was found to have minimal coronary plaque, normal  left ventricular ejection fraction; however, there was significant  ventricular ectopy during catheterization.  Dr. Antoine Poche did not feel  that it appeared to be significant valvular abnormality or other outflow  obstruction.  [name] [name] was felt to be non-cardiac.  She  actually saw Dr. Learta Codding at [name] prior to  catheterization for evaluation of atypical chest [name].  [name] [name] has  a history of non-obstructive coronary artery disease and underwent a  cardiac catheterization in the early 1990's at [name].  Dr.  Andee Lineman sent the patient for a repeat catheterization, secondary to  multiple risk factors.  He suspected that the patient's chest  discomfort, however, could be related to her fibromyalgia or possibly  gastroesophageal reflux disease.  In speaking with the patient [name], I  feel that her discomfort is most likely secondary to gastroesophageal  reflux disease/questionable gastroparesis.  She is telling me that she  has more frequent episodes of indigestion.  Apparently on two different  occasions she has experienced the sharp fleeting [name] in her sternal  area and has been followed by an episode of vomiting of a bile liquid,  that is eventually relieved when she sips on some Coke and burps.  She  is rather concerned about the most recent episode on Friday.  She was  out with her husband in the [name].  She had eaten a  bacon/lettuce/tomato sandwich.  When she got to the store, she had  the  chest discomfort once again.  She actually became nauseated and vomited  up her breakfast in the bathroom there and also continued to vomit  several more times throughout the morning, which was finally relieved  after she drank some Coke.  She denied being around anyone who had been  sick.  No fever or chills.  No abdominal discomfort.  No blood noted in  the emesis.  No diarrhea.  She states she has been fine since then, so  possibly may have had a gastritis or the episodes may also be related to  GI motility.   PAST MEDICAL HISTORY:  1. Nonobstructive coronary artery disease, as stated above, status      post stress Myoview in September 2007, which showed non-diagnostic      for ischemia, status post recent cardiac catheterization earlier      this month.  2. History of borderline diabetes; however, the patient is on      medication.  3. Significant ventricular ectopy during recent cardiac      catheterization.  4. Remote history of TIA.  The patient evaluated at the [name] for that.  5. Dizziness, status post carotid Dopplers in September 2007, showing      no significant plaque formation.  6. Remote history of gastric bypass surgery.  7. Hyperlipidemia.  8. Hypertension.  9. Fibromyalgia.  10.Chronic back [name], status post two prior back surgeries.  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  12.Limited mobility.  13.Status post hysterectomy.   REVIEW OF SYSTEMS:  As stated above.   ALLERGIES:  CODEINE AND BEES.   MEDICATIONS:  1. Aspirin 81 mg daily.  2. Glucophage 500 mg b.i.d.  3. Lopid 600 mg b.i.d.  4. Metoprolol 100 mg q.a.m. and 25 mg at bedtime.  5. Xanax 0.5 mg p.r.n.  6. Neurontin 300 mg q.h.s. for questionable peripheral diabetic      neuropathy.   PHYSICAL EXAMINATION:  VITAL SIGNS:  Blood pressure 170/74, heart rate  77, weight 242 pounds.  GENERAL:  [name] [name] is in no acute distress, a very pleasant and  cooperative 70 year old Caucasian female.   HEENT:  Pupils equal, round, reactive to light.  Sclerae clear.  NECK:  Supple without lymphadenopathy.  No bruits, no jugular venous  distention.  CARDIOVASCULAR:  S1 and S2, a regular rate and rhythm.  CHEST:  Clear to auscultation bilaterally.  ABDOMEN:  Soft, nontender.  Positive bowel sounds, obese.  EXTREMITIES:  Lower extremities without clubbing, cyanosis or edema.  NEUROLOGIC:  The patient is alert and oriented x3.  Cranial nerves II-  XII  grossly intact.   IMPRESSION:  1. Atypical chest [name], questionable gastroesophageal reflux      disease/esophageal spasms.  2. Recent episodes of vomiting, relieved with drinking Coke.  3. Obesity.  4. Diabetes.  5. Peripheral neuropathy.  6. Hypertension.   PLAN:  I have given samples of Nexium, to see if this relieves her chest  discomfort.  I feel she will probably need to be worked up by her  primary care physician and a possible GI workup, her chest discomfort, ,  being associated with the vomiting. The cardiac catheterization site to  right groin is stable.  No further cardiac workup needed at this time.   The patient is to follow up with her primary care physician, whom I  believe is Dr. Wyvonnia Lora.      Dorian Pod, ACNP  Electronically Signed      Learta Codding, MD,FACC  Electronically Signed   MB/MedQ  DD: 11/04/2006  DT: 11/04/2006  Job #: [id number]   cc:   Wyvonnia Lora

## 2010-12-28 NOTE — Cardiovascular Report (Signed)
[name] [name], [name] [name]               ACCOUNT NO.:  [id number]   MEDICAL RECORD NO.:  [id number]          PATIENT TYPE:  OIB   LOCATION:  1962                         FACILITY:  [facility]   PHYSICIAN:  Rollene Rotunda, MD, FACCDATE OF BIRTH:  18-Feb-1941   DATE OF PROCEDURE:  10/22/2006  DATE OF DISCHARGE:  10/22/2006                            CARDIAC CATHETERIZATION   PRIMARY:  Dr. Lia Hopping.   CARDIOLOGIST:  Dr. Lewayne Bunting.   PROCEDURE:  Left heart catheterization/coronary arteriography.   INDICATIONS:  Evaluate patient with chest pain and multiple  cardiovascular risk factors (786.51).   PROCEDURAL NOTE:  Left heart catheterization was performed via the right  femoral artery.  The artery was cannulated using anterior wall puncture.  A #4-French arterial sheath was inserted via the modified Seldinger  technique.  Preformed Judkins and a pigtail catheter were utilized.  The  patient tolerated the procedure well and left the lab in stable  condition.   RESULTS:   HEMODYNAMICS:  LV 186/22, AO 165/115.   CORONARIES:  The left main was normal.  The LAD had luminal  irregularities.  First diagonal was moderate and normal.  The circumflex  in the AV groove was normal.  First obtuse marginal was moderate and  normal.  Second obtuse marginal was large and normal.  Posterolateral  was large and normal.  The right coronary artery was dominant.  There  was proximal 25% stenosis.  The PDA was of moderate size and normal.  The posterolateral was small x2 and normal.   LEFT VENTRICULOGRAM:  The left ventriculogram was obtained in the RAO  projection.  The EF was 65%.   CONCLUSION:  Minimal coronary plaque.  Normal left ventricular function.  There did appear to be left ventricular outflow gradient.  However,  there was significant ventricular ectopy.  This needs to be put into  clinical context, as there does not appear to be significant valvular  abnormality or other outflow obstruction  per Dr. Margarita Mail note.   PLAN:  The patient will follow up Dr. Andee Lineman in about 10 days.  We will  continue to have the patient follow up with Dr. Olena Leatherwood for evaluation  of non-anginal chest pain.      Rollene Rotunda, MD, [facility]  Electronically Signed     JH/MEDQ  D:  10/22/2006  T:  10/24/2006  Job:  [id number]   cc:   Marcille Blanco, MD,FACC
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
## 2010-12-28 NOTE — Discharge Summary (Signed)
[name] [name], [name] [name]               ACCOUNT NO.:  [id number]   MEDICAL RECORD NO.:  [id number]          PATIENT TYPE:  INP   LOCATION:  A223                          FACILITY:  [facility]   PHYSICIAN:  Vania Rea, M.D. DATE OF BIRTH:  [birth year]   DATE OF ADMISSION:  06/25/2004  DATE OF DISCHARGE:  11/15/2005LH                                 DISCHARGE SUMMARY   PRIMARY CARE PHYSICIAN:  Dr. Wyvonnia Lora.   DISCHARGE DIAGNOSES:  1.  Recurrent episodic numbness of the right side of the face associated      with weakness of the right side of the face and slurring of the speech.  2.  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  3.  History of hypertension.  4.  Morbid obesity.  5.  History of dyslipidemia.   DISPOSITION:  Discharged to home.   DISCHARGE CONDITION:  Stable.   DISCHARGE MEDICATIONS:  The patient is to resume all of her outpatient  medications including aspirin.   HOSPITAL COURSE:  Please refer to admission history and physical.  This is a  70 year old Caucasian lady, a patient of Dr. Wyvonnia Lora, who for the past  eight years has had recurrent episodes of numbness of the face with  associated weakness and dizziness which has at various times been described  by neurologist as TIAs or migraine.  There seems to have been some  controversy among her neurologists as to what is the exact etiology, and she  has been evaluated by MRIs in the past which have been negative.  The  patient had another episode yesterday and was brought to the emergency room  where she was noted to have a right facial weakness and slurred speech.  The  patient was admitted with a diagnosis of TIA and a workup proceeded  accordingly.  The patient has had bilateral carotid Dopplers which show no significant  stenosis.  The patient has had an MRI and MRA of the brain which show no  significant stenosis and no acute abnormalities.  The patient has had a 2D  echocardiogram,  however, the report on that is pending.  Since the patient's vital signs are stable.  She is ambulating in the halls  of the hospital without difficulty.  She still complains of a slight  numbness of the right side of her face, but there is no objective weakness  of the extremities.  She does appear to have a very mild right facial  asymmetry but it is not apparent when she smiles.  This is considered to be  very unlikely to be a TIA.  It may possibly be a disorder related  specifically to the seventh nerve or affecting particularly the seventh  nerve.   We have recommended to the patient that she follow up with her primary  neurologist.  She has indicated that she has financial issues that may  prevent her having  immediate followup, and she was referred to her case  manager who has discussed options with her.  The patient is to give the case  manager a call tomorrow.  FOLLOW UP:  1.  Follow up with primary care physician, Dr. Margo Common, within 1-2 weeks.  2.  Follow up with her neurologist in [place] within 1-2 weeks.   SPECIAL INSTRUCTIONS:  She is to take her medications as prescribed.   PHYSICAL EXAMINATION:  VITAL SIGNS:  Her vitals this morning:  Her  temperature is 97.9, pulse 81, respirations 20, blood pressure 124/66.  GENERAL:  She was alert and oriented  x 3.  CHEST:  Clear to auscultation bilaterally.  CARDIOVASCULAR:  She had a regular rhythm.  ABDOMEN:  Soft and nontender.  EXTREMITIES:  She had no edema of the extremities.     [place]   LC/MEDQ  D:  06/26/2004  T:  06/26/2004  Job:  [id number]

## 2011-04-16 ENCOUNTER — Ambulatory Visit (INDEPENDENT_AMBULATORY_CARE_PROVIDER_SITE_OTHER): Payer: Medicare Other | Admitting: Urology

## 2011-04-16 ENCOUNTER — Other Ambulatory Visit: Payer: Self-pay | Admitting: Urology

## 2011-04-16 DIAGNOSIS — R82998 Other abnormal findings in urine: Secondary | ICD-10-CM

## 2011-04-16 DIAGNOSIS — R300 Dysuria: Secondary | ICD-10-CM

## 2011-04-16 DIAGNOSIS — R8281 Pyuria: Secondary | ICD-10-CM

## 2011-04-19 ENCOUNTER — Ambulatory Visit (HOSPITAL_COMMUNITY)
Admission: RE | Admit: 2011-04-19 | Discharge: 2011-04-19 | Disposition: A | Discharge status: Home / Self Care | Payer: Medicare Other | Source: Ambulatory Visit | Attending: Urology | Admitting: Urology

## 2011-04-19 DIAGNOSIS — N390 Urinary tract infection, site not specified: Secondary | ICD-10-CM | POA: Insufficient documentation

## 2011-04-19 DIAGNOSIS — R109 Unspecified abdominal pain: Secondary | ICD-10-CM | POA: Insufficient documentation

## 2011-04-19 DIAGNOSIS — R8281 Pyuria: Secondary | ICD-10-CM

## 2011-04-19 DIAGNOSIS — R82998 Other abnormal findings in urine: Secondary | ICD-10-CM | POA: Insufficient documentation

## 2011-05-14 ENCOUNTER — Ambulatory Visit (INDEPENDENT_AMBULATORY_CARE_PROVIDER_SITE_OTHER): Payer: Medicare Other | Admitting: Urology

## 2011-05-14 ENCOUNTER — Other Ambulatory Visit: Payer: Self-pay | Admitting: Urology

## 2011-05-14 DIAGNOSIS — R1031 Right lower quadrant pain: Secondary | ICD-10-CM

## 2011-05-14 DIAGNOSIS — R300 Dysuria: Secondary | ICD-10-CM

## 2011-05-14 DIAGNOSIS — R82998 Other abnormal findings in urine: Secondary | ICD-10-CM

## 2011-05-14 DIAGNOSIS — N952 Postmenopausal atrophic vaginitis: Secondary | ICD-10-CM

## 2011-05-16 ENCOUNTER — Ambulatory Visit (HOSPITAL_COMMUNITY)
Admission: RE | Admit: 2011-05-16 | Discharge: 2011-05-16 | Disposition: A | Discharge status: Home / Self Care | Payer: Medicare Other | Source: Ambulatory Visit | Attending: Urology | Admitting: Urology

## 2011-05-16 DIAGNOSIS — I10 Essential (primary) hypertension: Secondary | ICD-10-CM | POA: Insufficient documentation

## 2011-05-16 DIAGNOSIS — R112 Nausea with vomiting, unspecified: Secondary | ICD-10-CM | POA: Insufficient documentation

## 2011-05-16 DIAGNOSIS — R109 Unspecified abdominal pain: Secondary | ICD-10-CM | POA: Insufficient documentation

## 2011-05-16 DIAGNOSIS — R1031 Right lower quadrant pain: Secondary | ICD-10-CM

## 2011-05-16 DIAGNOSIS — E119 Type 2 diabetes mellitus without complications: Secondary | ICD-10-CM | POA: Insufficient documentation

## 2011-05-16 DIAGNOSIS — R599 Enlarged lymph nodes, unspecified: Secondary | ICD-10-CM | POA: Insufficient documentation

## 2011-05-16 DIAGNOSIS — J984 Other disorders of lung: Secondary | ICD-10-CM | POA: Insufficient documentation

## 2011-05-16 MED ORDER — IOHEXOL 300 MG/ML  SOLN
100.0000 mL | Freq: Once | INTRAMUSCULAR | Status: AC | PRN
  Administered 2011-05-16: 100 mL via INTRAVENOUS

## 2011-07-30 ENCOUNTER — Encounter: Payer: Self-pay | Admitting: Cardiology

## 2011-07-31 ENCOUNTER — Encounter: Payer: Self-pay | Admitting: Cardiology

## 2011-07-31 ENCOUNTER — Ambulatory Visit (INDEPENDENT_AMBULATORY_CARE_PROVIDER_SITE_OTHER): Payer: Medicare Other | Admitting: Cardiology

## 2011-07-31 VITALS — BP 103/69 | HR 73 | Ht 67.0 in | Wt 225.0 lb

## 2011-07-31 DIAGNOSIS — M549 Dorsalgia, unspecified: Secondary | ICD-10-CM | POA: Insufficient documentation

## 2011-07-31 DIAGNOSIS — I10 Essential (primary) hypertension: Secondary | ICD-10-CM

## 2011-07-31 DIAGNOSIS — R0789 Other chest pain: Secondary | ICD-10-CM

## 2011-07-31 MED ORDER — NITROGLYCERIN 0.4 MG/SPRAY TL SOLN: 1.0000 | Status: DC | PRN

## 2011-07-31 NOTE — Assessment & Plan Note (Signed)
Negative cardiac catheterization in 2008 the chest pain appears to be responsive to isosorbide mononitrate. He was given a prescription for nitroglycerin spray.

## 2011-07-31 NOTE — Assessment & Plan Note (Signed)
I referred the patient to Dr. Trey Sailors. She still has severe ongoing back pain requiring fentanyl patches.

## 2011-07-31 NOTE — Progress Notes (Signed)
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 The patient's main complaints are chronic back pain after multiple prior surgeries. She also has paralysis in both legs. Has a history of TIAs on Coumadin. She states that she is single episode of substernal chest pain approximately a week ago and took Imdur which has helped. The plan was to give her a prescription of sublingual nitroglycerin but she never received this. I told her that I was going to give her a nitroglycerin spray today. Otherwise from a cardiac standpoint she has done well. She denies any orthopnea PND palpitations or syncope.   PMH: reviewed and listed in Problem List in Electronic Records (and see below) Past Medical History  Diagnosis Date  . Other chest pain   . Other chronic pain   . Esophageal reflux   . Unspecified essential hypertension   . Personal history of pulmonary embolism   . Myalgia and myositis, unspecified   . Coronary atherosclerosis of native coronary artery     CATHETERIZATION X 3  . Dysthymic disorder   . Morbid obesity   . Type II or unspecified type diabetes mellitus without mention of complication, not stated as uncontrolled    Past Surgical History  Procedure Date  . Back surgery     X 2 FOR DISK PROBLEMS  . Abdominal hysterectomy   . Gastric bypass     HISTORY OF  . Replacement total knee   . Carpal tunnel release   . Bladder suspension     Allergies/SH/FHX : available in Electronic Records for review  Allergies  Allergen Reactions  . Codeine    History   Social History  . Marital Status: Married    Spouse Name: SAMUEL    Number of Children: N/A  .  Years of Education: N/A   Occupational History  . RETIRED    Social History Main Topics  . Smoking status: Never Smoker   . Smokeless tobacco: Never Used  . Alcohol Use: No  . Drug Use: No  . Sexually Active: Not on file   Other Topics Concern  . Not on file   Social History Narrative  . No narrative on file   Family History  Problem Relation Age of Onset  . Coronary artery disease      FAMILY HISTORY    Medications: Current Outpatient Prescriptions  Medication Sig Dispense Refill  . ALPRAZolam (XANAX) 1 MG tablet Take 1 mg by mouth 3 (three) times daily as needed.        . Cephalexin 250 MG tablet Take 250 mg by mouth at bedtime.        . fentaNYL (DURAGESIC - DOSED MCG/HR) 50 MCG/HR Place 1 patch onto the skin every 3 (three) days.        . furosemide (LASIX) 20 MG tablet Take 20 mg by mouth daily.        Marland Kitchen gabapentin (NEURONTIN) 300 MG capsule Take 900 mg by mouth 3 (three) times daily.       . methocarbamol (ROBAXIN) 750 MG tablet Take 750 mg by mouth 3 (three) times daily as needed.        . metoprolol tartrate (LOPRESSOR) 25 MG tablet Take 12.5 mg  by mouth 2 (two) times daily.        . Multiple Vitamin (MULTIVITAMIN) tablet Take 1 tablet by mouth daily.        Marland Kitchen omeprazole (PRILOSEC) 40 MG capsule Take 40 mg by mouth daily.        Marland Kitchen oxycodone (OXY-IR) 5 MG capsule Take 5 mg by mouth every 4 (four) hours as needed.        . potassium chloride (KLOR-CON) 10 MEQ CR tablet Take 10 mEq by mouth daily.        Marland Kitchen warfarin (COUMADIN) 4 MG tablet Take 4 mg by mouth as directed.        . warfarin (COUMADIN) 5 MG tablet Take 5 mg by mouth as directed.        . nitroGLYCERIN (NITROLINGUAL) 0.4 MG/SPRAY spray Place 1 spray under the tongue every 5 (five) minutes as needed for chest pain.  12 g  3    ROS: No nausea or vomiting. No fever or chills.No melena or hematochezia.No bleeding.No claudication  Physical Exam: BP 103/69  Pulse 73  Ht 5\' 7"  (1.702 m)  Wt 225 lb (102.059  kg)  BMI 35.24 kg/m2 General: Overweight white female sitting in a wheelchair Neck: Normal carotid upstroke no carotid bruits. No thyromegaly nonnodular thyroid. JVD 6 cm Lungs: Clear breath sounds bilaterally without wheezing Cardiac: Regular rate and rhythm with normal S1 and S2 no murmur rubs or gallops Vascular: No edema. Normal dorsalis and posterior tibial pulses Skin: Warm and dry Physcologic: Depressed affect  12lead ECG: Not performed Limited bedside ECHO:N/A   Assessment and Plan   Or

## 2011-07-31 NOTE — Assessment & Plan Note (Signed)
Blood pressure control. Continue current medical therapy 

## 2011-07-31 NOTE — Patient Instructions (Signed)
   Referral to Dr. Channing Mutters Your physician wants you to follow up in:  1 year.  You will receive a reminder letter in the mail one-two months in advance.  If you don't receive a letter, please call our office to schedule the follow up appointment

## 2011-08-08 ENCOUNTER — Other Ambulatory Visit: Payer: Self-pay | Admitting: *Deleted

## 2011-08-08 DIAGNOSIS — M549 Dorsalgia, unspecified: Secondary | ICD-10-CM

## 2011-08-16 ENCOUNTER — Other Ambulatory Visit: Payer: Self-pay | Admitting: Urology

## 2011-08-16 DIAGNOSIS — R599 Enlarged lymph nodes, unspecified: Secondary | ICD-10-CM

## 2011-08-20 ENCOUNTER — Ambulatory Visit (HOSPITAL_COMMUNITY)
Admission: RE | Admit: 2011-08-20 | Discharge: 2011-08-20 | Disposition: A | Discharge status: Home / Self Care | Payer: Medicare Other | Source: Ambulatory Visit | Attending: Urology | Admitting: Urology

## 2011-08-20 DIAGNOSIS — R161 Splenomegaly, not elsewhere classified: Secondary | ICD-10-CM | POA: Insufficient documentation

## 2011-08-20 DIAGNOSIS — J984 Other disorders of lung: Secondary | ICD-10-CM | POA: Insufficient documentation

## 2011-08-20 DIAGNOSIS — R1031 Right lower quadrant pain: Secondary | ICD-10-CM | POA: Insufficient documentation

## 2011-08-20 DIAGNOSIS — R599 Enlarged lymph nodes, unspecified: Secondary | ICD-10-CM | POA: Insufficient documentation

## 2011-08-20 LAB — CREATININE, SERUM: GFR calc Af Amer: 70 mL/min — ABNORMAL LOW (ref >90)

## 2011-08-20 MED ORDER — IOHEXOL 300 MG/ML  SOLN
100.0000 mL | Freq: Once | INTRAMUSCULAR | Status: AC | PRN
  Administered 2011-08-20: 100 mL via INTRAVENOUS

## 2011-08-27 ENCOUNTER — Ambulatory Visit (INDEPENDENT_AMBULATORY_CARE_PROVIDER_SITE_OTHER): Payer: Medicare Other | Admitting: Urology

## 2011-08-27 DIAGNOSIS — R1031 Right lower quadrant pain: Secondary | ICD-10-CM

## 2011-08-27 DIAGNOSIS — R599 Enlarged lymph nodes, unspecified: Secondary | ICD-10-CM

## 2011-08-27 DIAGNOSIS — N952 Postmenopausal atrophic vaginitis: Secondary | ICD-10-CM

## 2011-08-27 DIAGNOSIS — R300 Dysuria: Secondary | ICD-10-CM

## 2011-12-19 ENCOUNTER — Other Ambulatory Visit: Payer: Self-pay | Admitting: Urology

## 2011-12-19 DIAGNOSIS — R300 Dysuria: Secondary | ICD-10-CM

## 2012-01-24 ENCOUNTER — Ambulatory Visit (HOSPITAL_COMMUNITY): Admission: RE | Admit: 2012-01-24 | Payer: Medicare Other | Source: Ambulatory Visit

## 2012-01-28 ENCOUNTER — Ambulatory Visit: Payer: Medicare Other | Admitting: Urology

## 2012-05-26 ENCOUNTER — Ambulatory Visit (INDEPENDENT_AMBULATORY_CARE_PROVIDER_SITE_OTHER): Payer: Medicare Other | Admitting: Urology

## 2012-05-26 DIAGNOSIS — M545 Low back pain, unspecified: Secondary | ICD-10-CM

## 2012-05-26 DIAGNOSIS — R82998 Other abnormal findings in urine: Secondary | ICD-10-CM

## 2012-07-13 DIAGNOSIS — D649 Anemia, unspecified: Secondary | ICD-10-CM

## 2012-07-15 ENCOUNTER — Inpatient Hospital Stay (HOSPITAL_COMMUNITY)
Admission: AD | Admit: 2012-07-15 | Discharge: 2012-07-25 | DRG: 683 | Disposition: A | Discharge status: Home / Self Care | Payer: Medicare Other | Source: Other Acute Inpatient Hospital | Attending: Family Medicine | Admitting: Family Medicine

## 2012-07-15 DIAGNOSIS — Z9884 Bariatric surgery status: Secondary | ICD-10-CM

## 2012-07-15 DIAGNOSIS — F341 Dysthymic disorder: Secondary | ICD-10-CM | POA: Diagnosis present

## 2012-07-15 DIAGNOSIS — L0390 Cellulitis, unspecified: Secondary | ICD-10-CM | POA: Diagnosis present

## 2012-07-15 DIAGNOSIS — R0789 Other chest pain: Secondary | ICD-10-CM

## 2012-07-15 DIAGNOSIS — I10 Essential (primary) hypertension: Secondary | ICD-10-CM | POA: Diagnosis present

## 2012-07-15 DIAGNOSIS — G43909 Migraine, unspecified, not intractable, without status migrainosus: Secondary | ICD-10-CM

## 2012-07-15 DIAGNOSIS — Z86718 Personal history of other venous thrombosis and embolism: Secondary | ICD-10-CM

## 2012-07-15 DIAGNOSIS — N179 Acute kidney failure, unspecified: Principal | ICD-10-CM | POA: Diagnosis present

## 2012-07-15 DIAGNOSIS — I509 Heart failure, unspecified: Secondary | ICD-10-CM | POA: Diagnosis present

## 2012-07-15 DIAGNOSIS — Z7901 Long term (current) use of anticoagulants: Secondary | ICD-10-CM

## 2012-07-15 DIAGNOSIS — R0602 Shortness of breath: Secondary | ICD-10-CM

## 2012-07-15 DIAGNOSIS — A419 Sepsis, unspecified organism: Secondary | ICD-10-CM

## 2012-07-15 DIAGNOSIS — E119 Type 2 diabetes mellitus without complications: Secondary | ICD-10-CM | POA: Diagnosis present

## 2012-07-15 DIAGNOSIS — E7800 Pure hypercholesterolemia, unspecified: Secondary | ICD-10-CM

## 2012-07-15 DIAGNOSIS — R6521 Severe sepsis with septic shock: Secondary | ICD-10-CM

## 2012-07-15 DIAGNOSIS — Z8679 Personal history of other diseases of the circulatory system: Secondary | ICD-10-CM

## 2012-07-15 DIAGNOSIS — E039 Hypothyroidism, unspecified: Secondary | ICD-10-CM | POA: Diagnosis present

## 2012-07-15 DIAGNOSIS — K219 Gastro-esophageal reflux disease without esophagitis: Secondary | ICD-10-CM | POA: Diagnosis present

## 2012-07-15 DIAGNOSIS — D638 Anemia in other chronic diseases classified elsewhere: Secondary | ICD-10-CM | POA: Diagnosis present

## 2012-07-15 DIAGNOSIS — L02419 Cutaneous abscess of limb, unspecified: Secondary | ICD-10-CM | POA: Diagnosis present

## 2012-07-15 DIAGNOSIS — E876 Hypokalemia: Secondary | ICD-10-CM

## 2012-07-15 DIAGNOSIS — R930 Abnormal findings on diagnostic imaging of skull and head, not elsewhere classified: Secondary | ICD-10-CM

## 2012-07-15 DIAGNOSIS — Z6841 Body Mass Index (BMI) 40.0 and over, adult: Secondary | ICD-10-CM

## 2012-07-15 DIAGNOSIS — I2510 Atherosclerotic heart disease of native coronary artery without angina pectoris: Secondary | ICD-10-CM | POA: Diagnosis present

## 2012-07-15 DIAGNOSIS — L03119 Cellulitis of unspecified part of limb: Secondary | ICD-10-CM | POA: Diagnosis present

## 2012-07-15 DIAGNOSIS — IMO0001 Reserved for inherently not codable concepts without codable children: Secondary | ICD-10-CM

## 2012-07-15 DIAGNOSIS — J159 Unspecified bacterial pneumonia: Secondary | ICD-10-CM

## 2012-07-15 DIAGNOSIS — M549 Dorsalgia, unspecified: Secondary | ICD-10-CM

## 2012-07-15 DIAGNOSIS — Z86711 Personal history of pulmonary embolism: Secondary | ICD-10-CM

## 2012-07-15 DIAGNOSIS — J9600 Acute respiratory failure, unspecified whether with hypoxia or hypercapnia: Secondary | ICD-10-CM

## 2012-07-15 HISTORY — DX: Acute myocardial infarction, unspecified: I21.9

## 2012-07-15 HISTORY — DX: Fibromyalgia: M79.7

## 2012-07-15 HISTORY — DX: Gastro-esophageal reflux disease without esophagitis: K21.9

## 2012-07-15 HISTORY — DX: Anxiety disorder, unspecified: F41.9

## 2012-07-16 ENCOUNTER — Encounter (HOSPITAL_COMMUNITY): Payer: Self-pay

## 2012-07-16 ENCOUNTER — Observation Stay (HOSPITAL_COMMUNITY): Payer: Medicare Other

## 2012-07-16 DIAGNOSIS — N179 Acute kidney failure, unspecified: Secondary | ICD-10-CM | POA: Diagnosis present

## 2012-07-16 DIAGNOSIS — M549 Dorsalgia, unspecified: Secondary | ICD-10-CM

## 2012-07-16 DIAGNOSIS — Z86718 Personal history of other venous thrombosis and embolism: Secondary | ICD-10-CM

## 2012-07-16 DIAGNOSIS — J9600 Acute respiratory failure, unspecified whether with hypoxia or hypercapnia: Secondary | ICD-10-CM

## 2012-07-16 DIAGNOSIS — Z86711 Personal history of pulmonary embolism: Secondary | ICD-10-CM

## 2012-07-16 LAB — COMPREHENSIVE METABOLIC PANEL
ALT: <5 U/L (ref 0–35)
Alkaline Phosphatase: 97 U/L (ref 39–117)
CO2: 22 mEq/L (ref 19–32)
Calcium: 8.3 mg/dL — ABNORMAL LOW (ref 8.4–10.5)
GFR calc Af Amer: 10 mL/min — ABNORMAL LOW (ref >90)
GFR calc non Af Amer: 8 mL/min — ABNORMAL LOW (ref >90)
Glucose, Bld: 90 mg/dL (ref 70–99)
Sodium: 138 mEq/L (ref 135–145)
Total Bilirubin: 0.2 mg/dL — ABNORMAL LOW (ref 0.3–1.2)

## 2012-07-16 LAB — CBC
Hemoglobin: 9.7 g/dL — ABNORMAL LOW (ref 12.0–15.0)
MCHC: 31.1 g/dL (ref 30.0–36.0)
Platelets: 188 10*3/uL (ref 150–400)
RDW: 15.9 % — ABNORMAL HIGH (ref 11.5–15.5)

## 2012-07-16 LAB — CREATININE, URINE, RANDOM: Creatinine, Urine: 29.28 mg/dL

## 2012-07-16 LAB — URINALYSIS, ROUTINE W REFLEX MICROSCOPIC
Glucose, UA: NEGATIVE mg/dL
Ketones, ur: NEGATIVE mg/dL
Nitrite: NEGATIVE
Specific Gravity, Urine: 1.01 (ref 1.005–1.030)
pH: 5 (ref 5.0–8.0)

## 2012-07-16 LAB — GLUCOSE, CAPILLARY: Glucose-Capillary: 105 mg/dL — ABNORMAL HIGH (ref 70–99)

## 2012-07-16 LAB — URINE MICROSCOPIC-ADD ON

## 2012-07-16 LAB — SODIUM, URINE, RANDOM: Sodium, Ur: 78 mEq/L

## 2012-07-16 MED ORDER — INSULIN ASPART 100 UNIT/ML ~~LOC~~ SOLN
0.0000 [IU] | Freq: Three times a day (TID) | SUBCUTANEOUS | Status: DC
  Administered 2012-07-24: 1 [IU] via SUBCUTANEOUS

## 2012-07-16 MED ORDER — CITALOPRAM HYDROBROMIDE 20 MG PO TABS
20.0000 mg | ORAL_TABLET | Freq: Every day | ORAL | Status: DC
  Administered 2012-07-17 – 2012-07-25 (×9): 20 mg via ORAL
  Filled 2012-07-16 (×9): qty 1

## 2012-07-16 MED ORDER — ONDANSETRON HCL 4 MG/2ML IJ SOLN: 4.0000 mg | Freq: Four times a day (QID) | INTRAMUSCULAR | Status: DC | PRN

## 2012-07-16 MED ORDER — ALPRAZOLAM 0.5 MG PO TABS
1.0000 mg | ORAL_TABLET | Freq: Three times a day (TID) | ORAL | Status: DC | PRN
  Administered 2012-07-16: 0.5 mg via ORAL
  Filled 2012-07-16: qty 1

## 2012-07-16 MED ORDER — SODIUM CHLORIDE 0.9 % IV SOLN
250.0000 mL | INTRAVENOUS | Status: DC | PRN
  Administered 2012-07-19 – 2012-07-20 (×2): 250 mL via INTRAVENOUS

## 2012-07-16 MED ORDER — SODIUM CHLORIDE 0.9 % IJ SOLN: 3.0000 mL | INTRAMUSCULAR | Status: DC | PRN

## 2012-07-16 MED ORDER — ACETAMINOPHEN 325 MG PO TABS
650.0000 mg | ORAL_TABLET | Freq: Four times a day (QID) | ORAL | Status: DC | PRN
  Administered 2012-07-17 – 2012-07-19 (×2): 650 mg via ORAL
  Filled 2012-07-16 (×2): qty 2

## 2012-07-16 MED ORDER — GABAPENTIN 100 MG PO CAPS
100.0000 mg | ORAL_CAPSULE | Freq: Three times a day (TID) | ORAL | Status: DC
  Administered 2012-07-16 – 2012-07-25 (×29): 100 mg via ORAL
  Filled 2012-07-16 (×31): qty 1

## 2012-07-16 MED ORDER — CLINDAMYCIN PHOSPHATE 600 MG/50ML IV SOLN
600.0000 mg | Freq: Four times a day (QID) | INTRAVENOUS | Status: DC
  Administered 2012-07-16 – 2012-07-25 (×35): 600 mg via INTRAVENOUS
  Filled 2012-07-16 (×41): qty 50

## 2012-07-16 MED ORDER — INFLUENZA VIRUS VACC SPLIT PF IM SUSP
0.5000 mL | INTRAMUSCULAR | Status: AC
  Administered 2012-07-16: 0.5 mL via INTRAMUSCULAR
  Filled 2012-07-16: qty 0.5

## 2012-07-16 MED ORDER — WARFARIN SODIUM 4 MG PO TABS
4.0000 mg | ORAL_TABLET | Freq: Once | ORAL | Status: AC
  Administered 2012-07-16: 4 mg via ORAL
  Filled 2012-07-16: qty 1

## 2012-07-16 MED ORDER — ALPRAZOLAM 0.5 MG PO TABS
0.5000 mg | ORAL_TABLET | Freq: Three times a day (TID) | ORAL | Status: DC | PRN
  Administered 2012-07-17 – 2012-07-24 (×16): 0.5 mg via ORAL
  Filled 2012-07-16 (×17): qty 1

## 2012-07-16 MED ORDER — FENTANYL 25 MCG/HR TD PT72
50.0000 ug | MEDICATED_PATCH | TRANSDERMAL | Status: DC
  Administered 2012-07-16 – 2012-07-25 (×4): 50 ug via TRANSDERMAL
  Filled 2012-07-16 (×4): qty 2

## 2012-07-16 MED ORDER — ACETAMINOPHEN 650 MG RE SUPP: 650.0000 mg | Freq: Four times a day (QID) | RECTAL | Status: DC | PRN

## 2012-07-16 MED ORDER — SODIUM CHLORIDE 0.9 % IJ SOLN
3.0000 mL | Freq: Two times a day (BID) | INTRAMUSCULAR | Status: DC
  Administered 2012-07-16 – 2012-07-18 (×5): 3 mL via INTRAVENOUS
  Administered 2012-07-18: 13:00:00 via INTRAVENOUS
  Administered 2012-07-20 – 2012-07-24 (×5): 3 mL via INTRAVENOUS

## 2012-07-16 MED ORDER — WARFARIN - PHARMACIST DOSING INPATIENT: Freq: Every day | Status: DC

## 2012-07-16 MED ORDER — INSULIN ASPART 100 UNIT/ML ~~LOC~~ SOLN: 0.0000 [IU] | Freq: Every day | SUBCUTANEOUS | Status: DC

## 2012-07-16 MED ORDER — DOCUSATE SODIUM 100 MG PO CAPS
100.0000 mg | ORAL_CAPSULE | Freq: Two times a day (BID) | ORAL | Status: DC
  Administered 2012-07-16 – 2012-07-25 (×17): 100 mg via ORAL
  Filled 2012-07-16 (×21): qty 1

## 2012-07-16 MED ORDER — METOPROLOL TARTRATE 12.5 MG HALF TABLET
12.5000 mg | ORAL_TABLET | Freq: Two times a day (BID) | ORAL | Status: DC
  Administered 2012-07-16 – 2012-07-17 (×3): 12.5 mg via ORAL
  Filled 2012-07-16 (×6): qty 1

## 2012-07-16 MED ORDER — METHOCARBAMOL 750 MG PO TABS
750.0000 mg | ORAL_TABLET | Freq: Three times a day (TID) | ORAL | Status: DC | PRN
  Administered 2012-07-18: 500 mg via ORAL
  Administered 2012-07-20 – 2012-07-22 (×3): 750 mg via ORAL
  Filled 2012-07-16 (×5): qty 1

## 2012-07-16 MED ORDER — PANTOPRAZOLE SODIUM 40 MG PO TBEC
40.0000 mg | DELAYED_RELEASE_TABLET | Freq: Every day | ORAL | Status: DC
  Administered 2012-07-16 – 2012-07-25 (×10): 40 mg via ORAL
  Filled 2012-07-16 (×9): qty 1

## 2012-07-16 MED ORDER — ISOSORBIDE MONONITRATE ER 30 MG PO TB24
30.0000 mg | ORAL_TABLET | Freq: Every day | ORAL | Status: DC
  Administered 2012-07-17: 30 mg via ORAL
  Filled 2012-07-16 (×2): qty 1

## 2012-07-16 MED ORDER — OXYCODONE HCL 5 MG PO TABS
5.0000 mg | ORAL_TABLET | ORAL | Status: DC | PRN
  Administered 2012-07-16 – 2012-07-25 (×16): 5 mg via ORAL
  Filled 2012-07-16 (×16): qty 1

## 2012-07-16 MED ORDER — SODIUM CHLORIDE 0.9 % IJ SOLN
3.0000 mL | Freq: Two times a day (BID) | INTRAMUSCULAR | Status: DC
  Administered 2012-07-16 – 2012-07-18 (×3): 3 mL via INTRAVENOUS

## 2012-07-16 MED ORDER — ONDANSETRON HCL 4 MG PO TABS: 4.0000 mg | ORAL_TABLET | Freq: Four times a day (QID) | ORAL | Status: DC | PRN

## 2012-07-16 MED ORDER — GABAPENTIN 300 MG PO CAPS
900.0000 mg | ORAL_CAPSULE | Freq: Three times a day (TID) | ORAL | Status: DC
  Filled 2012-07-16 (×3): qty 3

## 2012-07-16 NOTE — Consult Note (Signed)
Referring Provider: No ref. provider found Primary Care Physician:  Louie Boston, MD Primary Nephrologist:  Dr. 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 She was started on vancomycin and zosyn. Initial Cr was within normal limits at 0.95.has hx of DVT and PE sp IVC.   11/30      Cr   2.46       vanc trough          36.9     12/4         Cr 4.29         vanc trough         22.4  She was also treated with escalating doses of lasix   CT scan on admission did find numerous diffused lymph nodes worrisome for metastic disease vs. lymphoma and oncology consultant have seen her.        Past Medical History  Diagnosis Date  . Other chest pain   . Other chronic pain   . Esophageal reflux   . Unspecified essential hypertension   . Personal history of pulmonary embolism   . Myalgia and myositis, unspecified   . Coronary atherosclerosis of native coronary artery     CATHETERIZATION X 3  . Dysthymic disorder   . Morbid obesity   . Type II or unspecified type diabetes mellitus without mention of complication, not stated as uncontrolled   . Fibromyalgia   . GERD (gastroesophageal reflux disease)   . Anxiety   . Myocardial infarction     Past Surgical History  Procedure Date  . Back surgery     X 2 FOR DISK PROBLEMS  . Abdominal hysterectomy   . Gastric bypass     HISTORY OF  . Replacement total knee   . Carpal tunnel release   . Bladder suspension   . Appendectomy   . Cholecystectomy     Prior to Admission medications   Medication Sig Start Date End Date Taking? Authorizing Provider  ALPRAZolam Prudy Feeler) 0.5 MG tablet Take 0.5 mg by mouth 3 (three) times daily as needed. For anxiety   Yes Historical Provider, MD  citalopram (CELEXA) 20 MG tablet Take 20 mg by mouth daily.   Yes  Historical Provider, MD  fentaNYL (DURAGESIC - DOSED MCG/HR) 100 MCG/HR Place 1 patch onto the skin every 3 (three) days. pain   Yes Historical Provider, MD  furosemide (LASIX) 40 MG tablet Take 80 mg by mouth every morning.   Yes Historical Provider, MD  gabapentin (NEURONTIN) 300 MG capsule Take 600 mg by mouth 3 (three) times daily.   Yes Historical Provider, MD  isosorbide mononitrate (IMDUR) 30 MG 24 hr tablet Take 30 mg by mouth daily.   Yes Historical Provider, MD  magnesium oxide (MAG-OX) 400 MG tablet Take 400 mg by mouth daily.    Yes Historical Provider, MD  metoprolol tartrate (LOPRESSOR) 25 MG tablet Take 25 mg by mouth 2 (two) times daily.   Yes Historical Provider, MD  Multiple Vitamin (MULTIVITAMIN) tablet Take 1 tablet by mouth daily.     Yes Historical Provider, MD  nitroGLYCERIN (NITROSTAT) 0.4 MG SL tablet Place 0.4 mg under the tongue every 5 (five) minutes. For chest pain   Yes Historical Provider, MD  nystatin cream (MYCOSTATIN) Apply 1 application topically 2 (two)  times daily as needed. For skin irritation   Yes Historical Provider, MD  potassium chloride (K-DUR,KLOR-CON) 10 MEQ tablet Take 10 mEq by mouth 2 (two) times daily.   Yes Historical Provider, MD  spironolactone (ALDACTONE) 25 MG tablet Take 25 mg by mouth 2 (two) times daily.   Yes Historical Provider, MD  torsemide (DEMADEX) 20 MG tablet Take 40 mg by mouth 2 (two) times daily.    Yes Historical Provider, MD  warfarin (COUMADIN) 3 MG tablet Take 3 mg by mouth See admin instructions. Mon, Wed, Fri, Sat, Sun   Yes Historical Provider, MD  warfarin (COUMADIN) 4 MG tablet Take 4 mg by mouth 2 (two) times a week. Tues, Thurs   Yes Historical Provider, MD  oxycodone (OXY-IR) 5 MG capsule Take 5 mg by mouth every 6 (six) hours as needed. pain    Historical Provider, MD    Current Facility-Administered Medications  Medication Dose Route Frequency Provider Last Rate Last Dose  . 0.9 %  sodium chloride infusion  250 mL  Intravenous PRN Therisa Doyne, MD      . acetaminophen (TYLENOL) tablet 650 mg  650 mg Oral Q6H PRN Therisa Doyne, MD       Or  . acetaminophen (TYLENOL) suppository 650 mg  650 mg Rectal Q6H PRN Therisa Doyne, MD      . ALPRAZolam Prudy Feeler) tablet 0.5 mg  0.5 mg Oral TID PRN Sorin Luanne Bras, MD      . citalopram (CELEXA) tablet 20 mg  20 mg Oral Daily Sorin Luanne Bras, MD      . clindamycin (CLEOCIN) IVPB 600 mg  600 mg Intravenous Q6H Therisa Doyne, MD   600 mg at 07/16/12 1750  . docusate sodium (COLACE) capsule 100 mg  100 mg Oral BID Therisa Doyne, MD      . fentaNYL (DURAGESIC - dosed mcg/hr) patch 50 mcg  50 mcg Transdermal Q72H Therisa Doyne, MD   50 mcg at 07/16/12 1111  . gabapentin (NEURONTIN) capsule 100 mg  100 mg Oral TID Sorin Luanne Bras, MD   100 mg at 07/16/12 1554  . [COMPLETED] influenza  inactive virus vaccine (FLUZONE/FLUARIX) injection 0.5 mL  0.5 mL Intramuscular Tomorrow-1000 Henderson Cloud, MD   0.5 mL at 07/16/12 1127  . insulin aspart (novoLOG) injection 0-5 Units  0-5 Units Subcutaneous QHS Therisa Doyne, MD      . insulin aspart (novoLOG) injection 0-9 Units  0-9 Units Subcutaneous TID WC Therisa Doyne, MD      . isosorbide mononitrate (IMDUR) 24 hr tablet 30 mg  30 mg Oral Daily Sorin Luanne Bras, MD      . methocarbamol (ROBAXIN) tablet 750 mg  750 mg Oral TID PRN Therisa Doyne, MD      . metoprolol tartrate (LOPRESSOR) tablet 12.5 mg  12.5 mg Oral BID Therisa Doyne, MD      . ondansetron (ZOFRAN) tablet 4 mg  4 mg Oral Q6H PRN Therisa Doyne, MD       Or  . ondansetron (ZOFRAN) injection 4 mg  4 mg Intravenous Q6H PRN Therisa Doyne, MD      . oxyCODONE (Oxy IR/ROXICODONE) immediate release tablet 5 mg  5 mg Oral Q4H PRN Therisa Doyne, MD   5 mg at 07/16/12 1437  . pantoprazole (PROTONIX) EC tablet 40 mg  40 mg Oral Daily Therisa Doyne, MD   40 mg at 07/16/12 1142  . sodium chloride 0.9 % injection 3 mL   3 mL  Intravenous Q12H Therisa Doyne, MD   3 mL at 07/16/12 1148  . sodium chloride 0.9 % injection 3 mL  3 mL Intravenous Q12H Anastassia Doutova, MD      . sodium chloride 0.9 % injection 3 mL  3 mL Intravenous PRN Therisa Doyne, MD      . [COMPLETED] warfarin (COUMADIN) tablet 4 mg  4 mg Oral ONCE-1800 Abran Duke, PHARMD   4 mg at 07/16/12 1750  . Warfarin - Pharmacist Dosing Inpatient   Does not apply q1800 Vassie Loll, MD      . [DISCONTINUED] ALPRAZolam Prudy Feeler) tablet 1 mg  1 mg Oral TID PRN Therisa Doyne, MD   0.5 mg at 07/16/12 1142  . [DISCONTINUED] gabapentin (NEURONTIN) capsule 900 mg  900 mg Oral TID Therisa Doyne, MD        Allergies as of 07/15/2012 - Review Complete 07/31/2011  Allergen Reaction Noted  . Codeine  10/24/2009    Family History  Problem Relation Age of Onset  . Coronary artery disease      FAMILY HISTORY  . Diabetes type II Mother   . Heart disease Mother   . Lymphoma Father     History   Social History  . Marital Status: Married    Spouse Name: SAMUEL    Number of Children: N/A  . Years of Education: N/A   Occupational History  . RETIRED    Social History Main Topics  . Smoking status: Never Smoker   . Smokeless tobacco: Never Used  . Alcohol Use: No  . Drug Use: No  . Sexually Active: Not on file   Other Topics Concern  . Not on file   Social History Narrative  . No narrative on file    Review of Systems: Gen:  Fever, chills, sweats, anorexia, fatigue, weakness, malaise HEENT: No visual complaints, No history of Retinopathy. Normal external appearance No Epistaxis or Sore throat. No sinusitis.   CV: Denies chest pain, angina, palpitations, syncope, orthopnea, PND, peripheral edema, and claudication. Resp: Denies dyspnea at rest, dyspnea with exercise, cough, sputum, wheezing, coughing up blood, and pleurisy. GI: Denies vomiting blood, jaundice, and fecal incontinence.   Denies dysphagia or odynophagia. GU :  Denies urinary burning, blood in urine, urinary frequency, urinary hesitancy, nocturnal urination, and urinary incontinence.  No renal calculi. MS: Denies joint pain, limitation of movement, and swelling, stiffness, low back pain, extremity pain. Denies muscle weakness, cramps, atrophy.  No use of non steroidal antiinflammatory drugs. Derm: Denies rash, itching, dry skin, hives, moles, warts, or unhealing ulcers.  Psych: Denies depression, anxiety, memory loss, suicidal ideation, hallucinations, paranoia, and confusion. Heme: Denies bruising, bleeding, and enlarged lymph nodes. Neuro: No headache.  No diplopia. No dysarthria.  No dysphasia.  No history of CVA.  No Seizures. No paresthesias.  No weakness. Endocrine DM.  No Thyroid disease.  No Adrenal disease.  Physical Exam: Vital signs in last 24 hours: Temp:  [97.6 F (36.4 C)-98.7 F (37.1 C)] 97.6 F (36.4 C) (12/05 1745) Pulse Rate:  [75-89] 80  (12/05 1745) Resp:  [18] 18  (12/05 1745) BP: (109-131)/(45-72) 109/72 mmHg (12/05 1745) SpO2:  [98 %] 98 % (12/05 1745) FiO2 (%):  [21 %] 21 % (12/05 0316) Weight:  [138 kg (304 lb 3.8 oz)] 138 kg (304 lb 3.8 oz) (12/04 2333) Last BM Date: 07/15/12 General:  Ill appearing Head:  Normocephalic and atraumatic. Eyes:  Sclera clear, no icterus.   Conjunctiva pink. Ears:  Normal auditory acuity.  Nose:  No deformity, discharge,  or lesions. Mouth:  No deformity or lesions, dentition normal. Neck:  Supple; no masses or thyromegaly. JVP not elevated Lungs:  Clear throughout to auscultation.   No wheezes, crackles, or rhonchi. No acute distress. Heart:  Regular rate and rhythm; no murmurs, clicks, rubs,  or gallops. Abdomen:  Soft, nontender and nondistended. No masses, hepatosplenomegaly or hernias noted. Normal bowel sounds, without guarding, and without rebound.   Msk:  Symmetrical without gross deformities. Normal posture. Pulses:  No carotid, renal, femoral bruits. DP and PT symmetrical and  equal Extremities:  3 +    edema. Neurologic:  Alert and  oriented x4;  grossly normal neurologically. Skin:  Intact without significant lesions or rashes. Cervical Nodes:  No significant cervical adenopathy. Psych:  Alert and cooperative. Normal mood and affect.  Intake/Output from previous day: 12/04 0701 - 12/05 0700 In: -  Out: 1200 [Urine:1200] Intake/Output this shift:    Lab Results:  [facility] 07/16/12 0658  WBC 4.9  HGB 9.7*  HCT 31.2*  PLT 188   BMET  Basename 07/16/12 0658  NA 138  K 4.8  CL 110  CO2 22  GLUCOSE 90  BUN 27*  CREATININE 4.75*  CALCIUM 8.3*  PHOS 4.3   LFT  Basename 07/16/12 0658  PROT 5.5*  ALBUMIN 2.0*  AST 22  ALT <5  ALKPHOS 97  BILITOT 0.2*  BILIDIR --  IBILI --   PT/INR  Basename 07/16/12 0658  LABPROT 24.8*  INR 2.37*   Hepatitis Panel No results found for this basename: HEPBSAG,HCVAB,HEPAIGM,HEPBIGM in the last 72 hours  Studies/Results: US Renal  07/16/2012  *RADIOLOGY REPORT*  Clinical Data: Renal failure.  RENAL/URINARY TRACT ULTRASOUND COMPLETE  Comparison:  07/13/2012.  Findings:  Right Kidney:  11.7 cm. Normal echotexture.  Normal central sinus echo complex.  No calculi or hydronephrosis.  Left Kidney:  13.0 cm. Normal echotexture.  Normal central sinus echo complex.  No calculi or hydronephrosis. Inferior pole is difficult to visualize due to patient body habitus.  Bladder:  Foley catheter.  Urinary bladder decompressed.  The exam is technically degraded due to obese body habitus.  IMPRESSION:  1.  Normal appearance of the kidneys. 2.  Suboptimal exam due to obese body habitus. 3.  Decompressed urinary bladder with Foley catheter.   Original Report Authenticated By: Andreas Newport, M.D.     Assessment/Plan:  Acute renal Failure. Appears that this could be related to Vancomycin ATN although the trough level certainly does not appear excessive. Acute interstitial nephritis would also be suggested by the the use of  Vancomycin and Zosyn although this would be less likely and both drugs now discontinued. The leg does appear cellulitic and sepsis and ischemic ATN also possible. A renal ultrasound shows no hydronephrosis. The diffuse adenopathy would be concerning and a rheumatologic condition possible the sediment could help and serological evaluation not unreasonable although I think it unlikely to be causing the acute renal failure. A renal biopsy could also be considered although anticoagulation would need to be reversed.  Volume. I agree with holding lasix for now and following urine output. The lower extremity edema appears chronic and probably compounded by cellulitis.   LOS: 1 Lakeyia Surber W @TODAY @7 :16 PM

## 2012-07-16 NOTE — Progress Notes (Signed)
ANTICOAGULATION CONSULT NOTE - Initial Consult  Pharmacy Consult for Coumadin Indication: h/o PE  Allergies  Allergen Reactions  . Codeine Palpitations    Per patient, "Feels like I'm having a heart attack."    Patient Measurements: Height: 5\' 7"  (170.2 cm) Weight: 304 lb 3.8 oz (138 kg) IBW/kg (Calculated) : 61.6   Vital Signs: Temp: 98.5 F (36.9 C) (12/04 2333) Temp src: Oral (12/04 2333) BP: 123/56 mmHg (12/04 2333) Pulse Rate: 75  (12/04 2333)  Labs (from [facility]): No results found for this basename: HGB:2,HCT:3,PLT:3,APTT:3,LABPROT:3,INR:3,HEPARINUNFRC:3,CREATININE:3,CKTOTAL:3,CKMB:3,TROPONINI:3 in the last 72 hours  Estimated Creatinine Clearance: 81.1 ml/min (by C-G formula based on Cr of 0.94).   Medical History: Past Medical History  Diagnosis Date  . Other chest pain   . Other chronic pain   . Esophageal reflux   . Unspecified essential hypertension   . Personal history of pulmonary embolism   . Myalgia and myositis, unspecified   . Coronary atherosclerosis of native coronary artery     CATHETERIZATION X 3  . Dysthymic disorder   . Morbid obesity   . Type II or unspecified type diabetes mellitus without mention of complication, not stated as uncontrolled   . Fibromyalgia   . GERD (gastroesophageal reflux disease)   . Anxiety   . Myocardial infarction   h/o paraplegia   Home Medications:  Celexa  Demadex  Lopressor  MagOx  KCl  Percocet  Aldactone  Xanax  Coumadin 3 mg daily except 4 mg TueThu  Assessment: 71 yo female transferred from [facility] with ARF, cellulitis, h/o PE on Coumadin, to continue anticoagulation   Goal of Therapy:  INR 2-3 Monitor platelets by anticoagulation protocol: Yes   Plan:  F/U daily INR  Jeri Rawlins, Gary Fleet 07/16/2012,5:05 AM

## 2012-07-16 NOTE — Progress Notes (Signed)
[facility] PROGRESS NOTE  [name] [name] ZOX:[id number] DOB: 1941-04-13 DOA: 07/15/2012 PCP: [name] Boston, MD  Assessment/Plan: 1. ARF - developed during admission at [facility] while patient was taking Vancomycin and Zosyn. Patient was admitted with hypotension at [facility]. Patient may have ATN vs AIN. Check urine sediment and urine for eos. Await renal consult. Urine output seems OK.  2. HTN 3. Mild RLE Cellulitis - patient cahnged to iv clinda upon transfer to [place].  4. DM  5. Hx of DVT/PE after spinal surgery in 2010 s/p IVC filter placement - on chronic coumadin  6. Chronic back pain with hx of multiple surgeries and chronic paraparesis - continue fentanyl patch at reduced dose to  7. Hx of gastric bypass surgery  8. Volume overload - suspect we need to resume lasix - await decision from nephrlogist   Code Status: full Family Communication: patient  Disposition Plan: home   Consultants:  Renal   Procedures:  none  Antibiotics: Clindamycin 12/5  HPI/Subjective: No symptoms   Objective: Filed Vitals:   07/15/12 2333 07/16/12 0316 07/16/12 0529  BP: 123/56  131/60  Pulse: 75  85  Temp: 98.5 F (36.9 C)  97.6 F (36.4 C)  TempSrc: Oral  Oral  Resp: 18  18  Height: 5\' 7"  (1.702 m)    Weight: 138 kg (304 lb 3.8 oz)    SpO2: 98% 98% 98%    Intake/Output Summary (Last 24 hours) at 07/16/12 0849 Last data filed at 07/16/12 0532  Gross per 24 hour  Intake      0 ml  Output   1200 ml  Net  -1200 ml   Filed Weights   07/15/12 2333  Weight: 138 kg (304 lb 3.8 oz)    Exam:   General:  axox3  Cardiovascular: rrr, nl S1, S2   Respiratory: ctab, no w,r,c   Abdomen: soft, nt   LE bilat edema +3  Data Reviewed: Basic Metabolic Panel:  Lab 07/16/12 8119  NA 138  K 4.8  CL 110  CO2 22  GLUCOSE 90  BUN 27*  CREATININE 4.75*  CALCIUM 8.3*  MG 1.9  PHOS 4.3   Liver Function Tests:  Lab 07/16/12 0658  AST 22  ALT <5  ALKPHOS 97   BILITOT 0.2*  PROT 5.5*  ALBUMIN 2.0*   No results found for this basename: LIPASE:5,AMYLASE:5 in the last 168 hours No results found for this basename: AMMONIA:5 in the last 168 hours CBC:  Lab 07/16/12 0658  WBC 4.9  NEUTROABS --  HGB 9.7*  HCT 31.2*  MCV 85.5  PLT 188   Cardiac Enzymes: No results found for this basename: CKTOTAL:5,CKMB:5,CKMBINDEX:5,TROPONINI:5 in the last 168 hours BNP (last 3 results) No results found for this basename: PROBNP:3 in the last 8760 hours CBG:  Lab 07/16/12 0722  GLUCAP 91    No results found for this or any previous visit (from the past 240 hour(s)).   Studies: No results found.  Scheduled Meds:    . clindamycin (CLEOCIN) IV  600 mg Intravenous Q6H  . docusate sodium  100 mg Oral BID  . fentaNYL  50 mcg Transdermal Q72H  . gabapentin  900 mg Oral TID  . influenza  inactive virus vaccine  0.5 mL Intramuscular Tomorrow-1000  . insulin aspart  0-5 Units Subcutaneous QHS  . insulin aspart  0-9 Units Subcutaneous TID WC  . metoprolol tartrate  12.5 mg Oral BID  . pantoprazole  40 mg Oral Daily  .  sodium chloride  3 mL Intravenous Q12H  . sodium chloride  3 mL Intravenous Q12H  . Warfarin - Pharmacist Dosing Inpatient   Does not apply q1800   Continuous Infusions:   Active Problems:  DM  Essential hypertension, benign  Cellulitis  Acute renal failure  History of DVT of lower extremity  History of pulmonary embolism       [name] [name]  [facility] Pager [phone]. If 8PM-8AM, please contact night-coverage at [email or web address], password [facility] 07/16/2012, 8:49 AM  LOS: 1 day

## 2012-07-16 NOTE — H&P (Signed)
PCP:   Louie Boston, MD    Chief Complaint:  Acute renal failure transferred from [facility]    HPI: [name] [name] is a 71 y.o. female   has a past medical history of Other chest pain; Other chronic pain; Esophageal reflux; Unspecified essential hypertension; Personal history of pulmonary embolism; Myalgia and myositis, unspecified; Coronary atherosclerosis of native coronary artery; Dysthymic disorder; Morbid obesity; and Type II or unspecified type diabetes mellitus without mention of complication, not stated as uncontrolled.   Presented with  Patient was initially admitted to [facility] on 11/26 with increased confusion and shaking chills she was found to be in early sepsis and evidence of Left lower ext cellulitis. She was started on vancomycin and zosyn (and later that was changed to meropenem).  Initial Cr was within normal limits at 0.95. Of note she has hx of DVT and PE sp IVC. She also has hx of CHF and prior to admission her lasix had been increased to 80 mg po qd and Torsemide 40 mg BID has been added.  On 11/30 her Cr. Was noted to increase to 2.46 with GFR 19 and vanc trough was noted to be 36.9 and was discontinued but on the 12/4 her cr was up to 4.29 w GFR 10 and K 4.8 and vanc trough was 22.5. She was started on high dose lasix 200 mg q 8 h but without improvement.   From her cellulitis stand point her leg has improved but today family noted some return of mild redness and warmth.  Of note a Left leg doppler was done and reportedly was negative.  Of note her INR have been also supra-theraputic the last was 3.1 and her warfarin has been held.   Of note her sed rate was elevated at 84  Of note CT scan on admission did find numerous diffused lymph nodes worrisome for metastic disease vs. lymphoma and oncology consultant have seen her. Their recommendation was to  attempt  to obtain tissue diagnosis when patient stabilizes and acute issues resolve and then follow up with  them .   Review of Systems:   since her admission she has has some lower abdominal pain but seems that is actually chronic. No recent shorntess of breath.   Pertinent positives include: Fevers, chills now resolved, Bilateral lower extremity swelling left worse than right  Constitutional:  No weight loss, night sweats,fatigue, weight loss  HEENT:  No headaches, Difficulty swallowing,Tooth/dental problems,Sore throat,  No sneezing, itching, ear ache, nasal congestion, post nasal drip,  Cardio-vascular:  No chest pain, Orthopnea, PND, anasarca, dizziness, palpitations.no  GI:  No heartburn, indigestion, abdominal pain, nausea, vomiting, diarrhea, change in bowel habits, loss of appetite, melena, blood in stool, hematemesis Resp:  no shortness of breath at rest. No dyspnea on exertion, No excess mucus, no productive cough, No non-productive cough, No coughing up of blood.No change in color of mucus.No wheezing. Skin:  no rash or lesions. No jaundice GU:  no dysuria, change in color of urine, no urgency or frequency. No straining to urinate.  No flank pain.  Musculoskeletal:  No joint pain or no joint swelling. No decreased range of motion. No back pain.  Psych:  No change in mood or affect. No depression or anxiety. No memory loss.  Neuro: no localizing neurological complaints, no tingling, no weakness, no double vision, no gait abnormality, no slurred speech, no confusion  Otherwise ROS are negative except for above, 10 systems were reviewed  Past Medical History: Past  Medical History  Diagnosis Date  . Other chest pain   . Other chronic pain   . Esophageal reflux   . Unspecified essential hypertension   . Personal history of pulmonary embolism   . Myalgia and myositis, unspecified   . Coronary atherosclerosis of native coronary artery     CATHETERIZATION X 3  . Dysthymic disorder   . Morbid obesity   . Type II or unspecified type diabetes mellitus without mention of  complication, not stated as uncontrolled    Past Surgical History  Procedure Date  . Back surgery     X 2 FOR DISK PROBLEMS  . Abdominal hysterectomy   . Gastric bypass     HISTORY OF  . Replacement total knee   . Carpal tunnel release   . Bladder suspension      Medications: Prior to Admission medications   Medication Sig Start Date End Date Taking? Authorizing Provider  ALPRAZolam Prudy Feeler) 1 MG tablet Take 1 mg by mouth 3 (three) times daily as needed.      Historical Provider, MD  Cephalexin 250 MG tablet Take 250 mg by mouth at bedtime.      Historical Provider, MD  fentaNYL (DURAGESIC - DOSED MCG/HR) 50 MCG/HR Place 1 patch onto the skin every 3 (three) days.      Historical Provider, MD  furosemide (LASIX) 20 MG tablet Take 20 mg by mouth daily.      Historical Provider, MD  gabapentin (NEURONTIN) 300 MG capsule Take 900 mg by mouth 3 (three) times daily.     Historical Provider, MD  methocarbamol (ROBAXIN) 750 MG tablet Take 750 mg by mouth 3 (three) times daily as needed.      Historical Provider, MD  metoprolol tartrate (LOPRESSOR) 25 MG tablet Take 12.5 mg by mouth 2 (two) times daily.      Historical Provider, MD  Multiple Vitamin (MULTIVITAMIN) tablet Take 1 tablet by mouth daily.      Historical Provider, MD  nitroGLYCERIN (NITROLINGUAL) 0.4 MG/SPRAY spray Place 1 spray under the tongue every 5 (five) minutes as needed for chest pain. 07/31/11 07/30/12  June Leap, MD  omeprazole (PRILOSEC) 40 MG capsule Take 40 mg by mouth daily.      Historical Provider, MD  oxycodone (OXY-IR) 5 MG capsule Take 5 mg by mouth every 4 (four) hours as needed.      Historical Provider, MD  potassium chloride (KLOR-CON) 10 MEQ CR tablet Take 10 mEq by mouth daily.      Historical Provider, MD  warfarin (COUMADIN) 4 MG tablet Take 4 mg by mouth as directed.      Historical Provider, MD  warfarin (COUMADIN) 5 MG tablet Take 5 mg by mouth as directed.      Historical Provider, MD     Allergies:   Allergies  Allergen Reactions  . 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 She has never used smokeless tobacco. She reports that she does not drink alcohol or use illicit drugs.   Family History: family history includes Coronary artery disease in an unspecified family member; Diabetes type II in her mother; Heart disease in her mother; and Lymphoma in her father.    Physical Exam: Patient Vitals for the past 24 hrs:  BP Temp Temp  src Pulse Resp SpO2 Height Weight  07/15/12 2333 123/56 mmHg 98.5 F (36.9 C) Oral 75  18  98 % 5\' 7"  (1.702 m) 138 kg (304 lb 3.8 oz)    1. General:  in No Acute distress 2. Psychological: Alert and  Oriented 3. Head/ENT:     Dry Mucous Membranes                          Head Non traumatic, neck supple                          Normal   Dentition 4. SKIN:  decreased Skin turgor,  Skin clean Dry and intact   5. Heart: Regular rate and rhythm no Murmur, Rub or gallop 6. Lungs: Clear to auscultation bilaterally, no wheezes or crackles   7. Abdomen: Soft, mildly diffusely tender, Non distended, obese 8. Lower extremities: no clubbing, cyanosis, left leg with extensive edema, some mild peri tibial erythema  Noted.  9. Neurologically Grossly intact, moving all 4 extremities equally 10. MSK: Normal range of motion  body mass index is 47.65 kg/(m^2).   Labs on 12/4 from [place]:     Lab Results  Component Value Date   HGBA1C  Value: 5.8 (NOTE)   The ADA recommends the following therapeutic goal for glycemic   control related to Hgb A1C measurement:   Goal of Therapy:   < 7.0% Hgb A1C   Reference: American Diabetes Association: Clinical Practice   Recommendations 2008, Diabetes Care,  2008, 31:(Suppl 1). 09/26/2008    Estimated Creatinine Clearance:  81.1 ml/min (by C-G formula based on Cr of 0.94).   I have pearsonaly reviewed this: ECG REPORT on 07/12/12  Rate: 71  Rhythm: NSR ST&T Change: q waves in III and aVF   Cultures:    Component Value Date/Time   SDES WOUND BACK DRAINAGE 10/07/2008 1641   SPECREQUEST IMMUNE:NORM 10/07/2008 1641   CULT  Value: FEW STAPHYLOCOCCUS AUREUS Note: RIFAMPIN AND GENTAMICIN SHOULD NOT BE USED AS SINGLE DRUGS FOR TREATMENT OF STAPH INFECTIONS. 10/07/2008 1641   REPTSTATUS 10/11/2008 FINAL 10/07/2008 1641       Radiological Exams on Admission: No results found.  Chart has been reviewed  Assessment/Plan  71 yo F with likely vancomycin induced renal failure.   Present on Admission:  . Acute renal failure - calculate FeNa, order renal US and for now hold off on lasix as patient never had any pulmonary edema. Leg edema could be secondary to infectious etiology such as cellulitis. Spoke to nephrology consultant who also recommended holding off on lasix for now. Hold of on reno toxic drugs. Will get another vancomycin level in AM.   . Essential hypertension, benign . Cellulitis - for now will change to clindamycin since vanco is no longer an option.   . DM - mild likely diet controlled will do SSI for now  Prophylaxis: on coumadin Protonix  CODE STATUS: FULL CODE  Other plan as per orders.  I have spent a total of 65 min on this admission, time taken to speak with renal consultant.   Chenika Nevils 07/16/2012, 3:22 AM

## 2012-07-16 NOTE — Progress Notes (Signed)
07/16/12.1200.nsg Per patient she is a difficult stick ; she has 2 piv from [facility] flushing well: Dr. Lavera Guise aware and he said OK to keep PIV

## 2012-07-16 NOTE — Progress Notes (Signed)
ANTICOAGULATION CONSULT NOTE - Follow Up Consult  Pharmacy Consult for Warfarin Indication: h/o PE  Allergies  Allergen Reactions  . Bee Venom   . Codeine Palpitations    Per patient, "Feels like I'm having a heart attack."    Patient Measurements: Height: 5\' 7"  (170.2 cm) Weight: 304 lb 3.8 oz (138 kg) IBW/kg (Calculated) : 61.6   Vital Signs: Temp: 97.8 F (36.6 C) (12/05 0855) Temp src: Oral (12/05 0855) BP: 116/45 mmHg (12/05 0855) Pulse Rate: 85  (12/05 0855)  Labs:  Basename 07/16/12 0658  HGB 9.7*  HCT 31.2*  PLT 188  APTT --  LABPROT 24.8*  INR 2.37*  HEPARINUNFRC --  CREATININE 4.75*  CKTOTAL --  CKMB --  TROPONINI --    Estimated Creatinine Clearance: 16 ml/min (by C-G formula based on Cr of 4.75).  Assessment: Pt is a 71 y/o F transferred from [facility] hospital with acute renal failure and cellulitis who is on chronic warfarin for h/o PE, to continue warfarin per pharmacy. Warfarin had been held since 12/1 for elevated INRs. To restart warfarin today as INR is 2.37. CBC stable, no evidence of bleeding reported.   Goal of Therapy:  INR 2-3 Monitor platelets by anticoagulation protocol: Yes   Plan:  - Warfarin 4mg  x 1 at 1800 - Daily PT/INR - Monitor closely for bleeding - Trend renal function  Thank you for the consult,   Abran Duke, PharmD Clinical Pharmacist Phone: [phone] Pager: [phone] 07/16/2012 10:30 AM

## 2012-07-17 DIAGNOSIS — Z9884 Bariatric surgery status: Secondary | ICD-10-CM

## 2012-07-17 DIAGNOSIS — J159 Unspecified bacterial pneumonia: Secondary | ICD-10-CM

## 2012-07-17 LAB — MPO/PR-3 (ANCA) ANTIBODIES: Myeloperoxidase Abs: <1 AU/mL (ref <20)

## 2012-07-17 LAB — BASIC METABOLIC PANEL
Chloride: 108 mEq/L (ref 96–112)
GFR calc Af Amer: 10 mL/min — ABNORMAL LOW (ref >90)
GFR calc non Af Amer: 8 mL/min — ABNORMAL LOW (ref >90)
Potassium: 5 mEq/L (ref 3.5–5.1)
Sodium: 137 mEq/L (ref 135–145)

## 2012-07-17 LAB — ANA: Anti Nuclear Antibody(ANA): NEGATIVE

## 2012-07-17 LAB — C4 COMPLEMENT: Complement C4, Body Fluid: 10 mg/dL — ABNORMAL LOW (ref 10–40)

## 2012-07-17 LAB — SODIUM, URINE, RANDOM: Sodium, Ur: 29 mEq/L

## 2012-07-17 LAB — PROTIME-INR: Prothrombin Time: 27.2 seconds — ABNORMAL HIGH (ref 11.6–15.2)

## 2012-07-17 LAB — GLUCOSE, CAPILLARY
Glucose-Capillary: 100 mg/dL — ABNORMAL HIGH (ref 70–99)
Glucose-Capillary: 100 mg/dL — ABNORMAL HIGH (ref 70–99)
Glucose-Capillary: 84 mg/dL (ref 70–99)

## 2012-07-17 LAB — PROTEIN / CREATININE RATIO, URINE
Protein Creatinine Ratio: 0.14 (ref 0.00–0.15)
Total Protein, Urine: 21.2 mg/dL

## 2012-07-17 MED ORDER — WARFARIN SODIUM 3 MG PO TABS
3.0000 mg | ORAL_TABLET | Freq: Once | ORAL | Status: AC
  Administered 2012-07-17: 3 mg via ORAL
  Filled 2012-07-17: qty 1

## 2012-07-17 MED ORDER — FUROSEMIDE 80 MG PO TABS
160.0000 mg | ORAL_TABLET | Freq: Two times a day (BID) | ORAL | Status: DC
  Administered 2012-07-17 – 2012-07-20 (×6): 160 mg via ORAL
  Filled 2012-07-17 (×8): qty 2

## 2012-07-17 NOTE — Progress Notes (Signed)
2nd fluid filled blister found on proximal upper left thigh. 1.5 x 1.0 cm.

## 2012-07-17 NOTE — Progress Notes (Addendum)
ANTICOAGULATION CONSULT NOTE - Follow Up Consult  Pharmacy Consult for Warfarin Indication: h/o PE  Allergies  Allergen Reactions  . Bee Venom   . Codeine Palpitations    Per patient, "Feels like I'm having a heart attack."    Patient Measurements: Height: 5\' 7"  (170.2 cm) Weight: 304 lb 6.4 oz (138.075 kg) IBW/kg (Calculated) : 61.6   Vital Signs: Temp: 97.8 F (36.6 C) (12/06 0906) Temp src: Oral (12/06 0504) BP: 134/59 mmHg (12/06 0906) Pulse Rate: 82  (12/06 0906)  Labs:  Basename 07/17/12 0931 07/17/12 0450 07/16/12 0658  HGB -- -- 9.7*  HCT -- -- 31.2*  PLT -- -- 188  APTT -- -- --  LABPROT -- 27.2* 24.8*  INR -- 2.68* 2.37*  HEPARINUNFRC -- -- --  CREATININE 4.78* -- 4.75*  CKTOTAL -- -- --  CKMB -- -- --  TROPONINI -- -- --    Estimated Creatinine Clearance: 15.9 ml/min (by C-G formula based on Cr of 4.78).  Assessment: Pt is a 71 y/o F transferred from [facility] hospital with acute renal failure and cellulitis who is on chronic warfarin for h/o PE and DVT s/p IVC filter, to continue warfarin per pharmacy. Warfarin had been held since 12/1 for elevated INRs and was resumed 12/5.  Today INR is 2.68 after 4 mg dose given yesterday.   No evidence of bleeding reported.  Her home dose is Coumadin 3 mg daily except 4on Tue/Thu.  Goal of Therapy:  INR 2-3   Plan:  - Warfarin 3 mg x 1 at 1800 - Daily PT/INR - Monitor closely for bleeding - Trend renal function  Thank you for the consult,  Herby Abraham, Pharm.D. [phone] 07/17/2012 12:26 PM

## 2012-07-17 NOTE — Progress Notes (Signed)
[facility] PROGRESS NOTE  [name] [name] VHQ:[id number] DOB: 1941/07/14 DOA: 07/15/2012 PCP: Louie Boston, MD  Assessment/Plan: 1. ARF - developed during admission at [facility] while patient was taking Vancomycin and Zosyn. Patient was admitted with hypotension at [facility]. Patient may have ATN vs AIN. Check urine sediment and urine for eos. Await renal consult. Urine output seems OK. Creatinine stable   2. HTN 3. Mild RLE Cellulitis - patient cahnged to iv clinda upon transfer to [place].  4. DM  5. Hx of DVT/PE after spinal surgery in 2010 s/p IVC filter placement - on chronic coumadin  6. Chronic back pain with hx of multiple surgeries and chronic paraparesis - continue fentanyl patch at reduced dose to  7. Hx of gastric bypass surgery  8. Volume overload - resumed lasix 07/17/12   Code Status: full Family Communication: patient  Disposition Plan: home   Consultants:  Renal   Procedures:  none  Antibiotics: Clindamycin 12/5  HPI/Subjective: C/o edema and pain in the legs   Objective: Filed Vitals:   07/17/12 0504 07/17/12 0906 07/17/12 1359 07/17/12 1629  BP: 97/61 134/59 122/60 106/54  Pulse: 79 82 82 77  Temp: 98.1 F (36.7 C) 97.8 F (36.6 C) 97.9 F (36.6 C) 98.6 F (37 C)  TempSrc: Oral     Resp: 18 18 20 17   Height:      Weight:      SpO2: 95% 95% 99% 98%    Intake/Output Summary (Last 24 hours) at 07/17/12 1732 Last data filed at 07/17/12 1400  Gross per 24 hour  Intake    750 ml  Output    630 ml  Net    120 ml   Filed Weights   07/15/12 2333 07/16/12 2155  Weight: 138 kg (304 lb 3.8 oz) 138.075 kg (304 lb 6.4 oz)    Exam:   General:  axox3  Cardiovascular: rrr, nl S1, S2   Respiratory: ctab, no w,r,c   Abdomen: soft, nt   LE bilat edema +3  Data Reviewed: Basic Metabolic Panel:  Lab 07/17/12 4132 07/16/12 0658  NA 137 138  K 5.0 4.8  CL 108 110  CO2 21 22  GLUCOSE 147* 90  BUN 27* 27*  CREATININE 4.78* 4.75*   CALCIUM 8.1* 8.3*  MG -- 1.9  PHOS -- 4.3   Liver Function Tests:  Lab 07/16/12 0658  AST 22  ALT <5  ALKPHOS 97  BILITOT 0.2*  PROT 5.5*  ALBUMIN 2.0*   No results found for this basename: LIPASE:5,AMYLASE:5 in the last 168 hours No results found for this basename: AMMONIA:5 in the last 168 hours CBC:  Lab 07/16/12 0658  WBC 4.9  NEUTROABS --  HGB 9.7*  HCT 31.2*  MCV 85.5  PLT 188   Cardiac Enzymes: No results found for this basename: CKTOTAL:5,CKMB:5,CKMBINDEX:5,TROPONINI:5 in the last 168 hours BNP (last 3 results) No results found for this basename: PROBNP:3 in the last 8760 hours CBG:  Lab 07/17/12 1629 07/17/12 1136 07/17/12 0729 07/16/12 2202 07/16/12 1659  GLUCAP 100* 92 100* 112* 93    No results found for this or any previous visit (from the past 240 hour(s)).   Studies: US Renal  07/16/2012  *RADIOLOGY REPORT*  Clinical Data: Renal failure.  RENAL/URINARY TRACT ULTRASOUND COMPLETE  Comparison:  07/13/2012.  Findings:  Right Kidney:  11.7 cm. Normal echotexture.  Normal central sinus echo complex.  No calculi or hydronephrosis.  Left Kidney:  13.0 cm. Normal echotexture.  Normal central sinus echo complex.  No calculi or hydronephrosis. Inferior pole is difficult to visualize due to patient body habitus.  Bladder:  Foley catheter.  Urinary bladder decompressed.  The exam is technically degraded due to obese body habitus.  IMPRESSION:  1.  Normal appearance of the kidneys. 2.  Suboptimal exam due to obese body habitus. 3.  Decompressed urinary bladder with Foley catheter.   Original Report Authenticated By: Andreas Newport, M.D.    Results for DEINA, LIPSEY (MRN [id number]) as of 07/17/2012 07:56  Ref. Range 07/16/2012 15:55  Color, Urine Latest Range: YELLOW  YELLOW  APPearance Latest Range: CLEAR  CLOUDY (A)  Specific Gravity, Urine Latest Range: 1.005-1.030  1.010  pH Latest Range: 5.0-8.0  5.0  Glucose Latest Range: NEGATIVE mg/dL NEGATIVE  Bilirubin  Urine Latest Range: NEGATIVE  SMALL (A)  Ketones, ur Latest Range: NEGATIVE mg/dL NEGATIVE  Protein Latest Range: NEGATIVE mg/dL NEGATIVE  Urobilinogen, UA Latest Range: 0.0-1.0 mg/dL 0.2  Nitrite Latest Range: NEGATIVE  NEGATIVE  Leukocytes, UA Latest Range: NEGATIVE  MODERATE (A)  Hgb urine dipstick Latest Range: NEGATIVE  SMALL (A)  Urine-Other No range found MANY YEAST  WBC, UA Latest Range: <3 WBC/hpf 11-20  RBC / HPF Latest Range: <3 RBC/hpf 3-6  Squamous Epithelial / LPF Latest Range: RARE  RARE  Bacteria, UA Latest Range: RARE  RARE   Scheduled Meds:    . citalopram  20 mg Oral Daily  . clindamycin (CLEOCIN) IV  600 mg Intravenous Q6H  . docusate sodium  100 mg Oral BID  . fentaNYL  50 mcg Transdermal Q72H  . furosemide  160 mg Oral BID  . gabapentin  100 mg Oral TID  . insulin aspart  0-5 Units Subcutaneous QHS  . insulin aspart  0-9 Units Subcutaneous TID WC  . isosorbide mononitrate  30 mg Oral Daily  . metoprolol tartrate  12.5 mg Oral BID  . pantoprazole  40 mg Oral Daily  . sodium chloride  3 mL Intravenous Q12H  . sodium chloride  3 mL Intravenous Q12H  . [COMPLETED] warfarin  3 mg Oral ONCE-1800  . [COMPLETED] warfarin  4 mg Oral ONCE-1800  . Warfarin - Pharmacist Dosing Inpatient   Does not apply q1800   Continuous Infusions:   Active Problems:  DM  Essential hypertension, benign  Cellulitis  Acute renal failure  History of DVT of lower extremity  History of pulmonary embolism       Dave Mergen  [facility] Pager [phone]. If 8PM-8AM, please contact night-coverage at [email or web address], password [facility] 07/17/2012, 5:32 PM  LOS: 2 days

## 2012-07-17 NOTE — Progress Notes (Signed)
S:Feel a little better O:BP 134/59  Pulse 82  Temp 97.8 F (36.6 C) (Oral)  Resp 18  Ht 5\' 7"  (1.702 m)  Wt 138.075 kg (304 lb 6.4 oz)  BMI 47.68 kg/m2  SpO2 95%  Intake/Output Summary (Last 24 hours) at 07/17/12 1104 Last data filed at 07/17/12 0906  Gross per 24 hour  Intake    840 ml  Output    730 ml  Net    110 ml   Weight change: 0.075 kg (2.6 oz) ZOX:WRUEA and alert CVS:RRR Resp:clear Abd:+BS NTND Ext:++edema  Erythema Lt lower ext improving NEURO:CNI OX3 no asterixis      . citalopram  20 mg Oral Daily  . clindamycin (CLEOCIN) IV  600 mg Intravenous Q6H  . docusate sodium  100 mg Oral BID  . fentaNYL  50 mcg Transdermal Q72H  . gabapentin  100 mg Oral TID  . [COMPLETED] influenza  inactive virus vaccine  0.5 mL Intramuscular Tomorrow-1000  . insulin aspart  0-5 Units Subcutaneous QHS  . insulin aspart  0-9 Units Subcutaneous TID WC  . isosorbide mononitrate  30 mg Oral Daily  . metoprolol tartrate  12.5 mg Oral BID  . pantoprazole  40 mg Oral Daily  . sodium chloride  3 mL Intravenous Q12H  . sodium chloride  3 mL Intravenous Q12H  . [COMPLETED] warfarin  4 mg Oral ONCE-1800  . Warfarin - Pharmacist Dosing Inpatient   Does not apply q1800   US Renal  07/16/2012  *RADIOLOGY REPORT*  Clinical Data: Renal failure.  RENAL/URINARY TRACT ULTRASOUND COMPLETE  Comparison:  07/13/2012.  Findings:  Right Kidney:  11.7 cm. Normal echotexture.  Normal central sinus echo complex.  No calculi or hydronephrosis.  Left Kidney:  13.0 cm. Normal echotexture.  Normal central sinus echo complex.  No calculi or hydronephrosis. Inferior pole is difficult to visualize due to patient body habitus.  Bladder:  Foley catheter.  Urinary bladder decompressed.  The exam is technically degraded due to obese body habitus.  IMPRESSION:  1.  Normal appearance of the kidneys. 2.  Suboptimal exam due to obese body habitus. 3.  Decompressed urinary bladder with Foley catheter.   Original Report  Authenticated By: Andreas Newport, M.D.    BMET    Component Value Date/Time   NA 137 07/17/2012 0931   K 5.0 07/17/2012 0931   CL 108 07/17/2012 0931   CO2 21 07/17/2012 0931   GLUCOSE 147* 07/17/2012 0931   BUN 27* 07/17/2012 0931   CREATININE 4.78* 07/17/2012 0931   CALCIUM 8.1* 07/17/2012 0931   GFRNONAA 8* 07/17/2012 0931   GFRAA 10* 07/17/2012 0931   CBC    Component Value Date/Time   WBC 4.9 07/16/2012 0658   RBC 3.65* 07/16/2012 0658   HGB 9.7* 07/16/2012 0658   HCT 31.2* 07/16/2012 0658   PLT 188 07/16/2012 0658   MCV 85.5 07/16/2012 0658   MCH 26.6 07/16/2012 0658   MCHC 31.1 07/16/2012 0658   RDW 15.9* 07/16/2012 0658   LYMPHSABS 1.3 10/08/2008 0635   MONOABS 0.3 10/08/2008 0635   EOSABS 0.1 10/08/2008 0635   BASOSABS 0.0 10/08/2008 0635     Assessment:  1. Acute renal failure ? ATN ?AIN 2. Lt leg cellulitis 3. DM  Plan: 1. Resume lasix orally 2. Await serologic studies and urine for eosinophils 3 .Check eosinophils in blood   [name] [name] T

## 2012-07-18 LAB — RENAL FUNCTION PANEL
Albumin: 2 g/dL — ABNORMAL LOW (ref 3.5–5.2)
Chloride: 108 mEq/L (ref 96–112)
Creatinine, Ser: 5.17 mg/dL — ABNORMAL HIGH (ref 0.50–1.10)
GFR calc Af Amer: 9 mL/min — ABNORMAL LOW (ref >90)
GFR calc non Af Amer: 8 mL/min — ABNORMAL LOW (ref >90)
Potassium: 4.8 mEq/L (ref 3.5–5.1)
Sodium: 136 mEq/L (ref 135–145)

## 2012-07-18 LAB — CBC WITH DIFFERENTIAL/PLATELET
Basophils Absolute: 0 10*3/uL (ref 0.0–0.1)
Basophils Relative: 0 % (ref 0–1)
Lymphocytes Relative: 33 % (ref 12–46)
MCHC: 31 g/dL (ref 30.0–36.0)
Neutro Abs: 3.5 10*3/uL (ref 1.7–7.7)
Neutrophils Relative %: 57 % (ref 43–77)
Platelets: 162 10*3/uL (ref 150–400)
RDW: 15.9 % — ABNORMAL HIGH (ref 11.5–15.5)
WBC: 6.2 10*3/uL (ref 4.0–10.5)

## 2012-07-18 LAB — PROTIME-INR
INR: 3.47 — ABNORMAL HIGH (ref 0.00–1.49)
Prothrombin Time: 32.9 seconds — ABNORMAL HIGH (ref 11.6–15.2)

## 2012-07-18 LAB — COMPLEMENT, TOTAL: Compl, Total (CH50): 59 U/mL (ref 31–60)

## 2012-07-18 LAB — GLUCOSE, CAPILLARY: Glucose-Capillary: 99 mg/dL (ref 70–99)

## 2012-07-18 MED ORDER — ISOSORBIDE MONONITRATE ER 30 MG PO TB24
30.0000 mg | ORAL_TABLET | Freq: Every day | ORAL | Status: DC
  Filled 2012-07-18: qty 1

## 2012-07-18 NOTE — Progress Notes (Signed)
Subjective: 825 cc recorded UOP last 24 hrs despite 160 bid po lasix, 440 in. BP's low-normal 90's-100's, weight up 138 > 140 kg in house. No SOB, + mild nausea  Objective Vital signs in last 24 hours: Filed Vitals:   07/17/12 1359 07/17/12 1629 07/17/12 2042 07/18/12 0559  BP: 122/60 106/54 98/52 100/48  Pulse: 82 77 75 64  Temp: 97.9 F (36.6 C) 98.6 F (37 C) 98 F (36.7 C) 98 F (36.7 C)  TempSrc:    Oral  Resp: 20 17 17 16   Height:      Weight:   140.252 kg (309 lb 3.2 oz)   SpO2: 99% 98% 98% 97%   Weight change: 2.177 kg (4 lb 12.8 oz)  Intake/Output Summary (Last 24 hours) at 07/18/12 0754 Last data filed at 07/18/12 0600  Gross per 24 hour  Intake    440 ml  Output    825 ml  Net   -385 ml   Labs: Basic Metabolic Panel:  Lab 07/18/12 1610 07/17/12 0931 07/16/12 0658  NA 136 137 138  K 4.8 5.0 4.8  CL 108 108 110  CO2 20 21 22   GLUCOSE 87 147* 90  BUN 29* 27* 27*  CREATININE 5.17* 4.78* 4.75*  ALB -- -- --  CALCIUM 7.8* 8.1* 8.3*  PHOS 5.0* -- 4.3   Liver Function Tests:  Lab 07/18/12 0605 07/16/12 0658  AST -- 22  ALT -- <5  ALKPHOS -- 97  BILITOT -- 0.2*  PROT -- 5.5*  ALBUMIN 2.0* 2.0*   No results found for this basename: LIPASE:3,AMYLASE:3 in the last 168 hours No results found for this basename: AMMONIA:3 in the last 168 hours CBC:  Lab 07/18/12 0605 07/16/12 0658  WBC 6.2 4.9  NEUTROABS 3.5 --  HGB 8.6* 9.7*  HCT 27.7* 31.2*  MCV 84.7 85.5  PLT 162 188   PT/INR: @labrcntip (inr:5) Cardiac Enzymes: No results found for this basename: CKTOTAL:5,CKMB:5,CKMBINDEX:5,TROPONINI:5 in the last 168 hours CBG:  Lab 07/17/12 2135 07/17/12 1629 07/17/12 1136 07/17/12 0729 07/16/12 2202  GLUCAP 84 100* 92 100* 112*    Iron Studies: No results found for this basename: IRON:30,TIBC:30,TRANSFERRIN:30,FERRITIN:30 in the last 168 hours  Physical Exam:  Blood pressure 100/48, pulse 64, temperature 98 F (36.7 C), temperature source Oral, resp.  rate 16, height 5\' 7"  (1.702 m), weight 140.252 kg (309 lb 3.2 oz), SpO2 97.00%.  RUE:AVWUJ and alert CVS:RRR Resp:clear Abd:+BS NTND Ext:++edema LE's Erythema Lt lower ext improving NEURO:CNI OX3 no asterixis  UPC ratio- 0.14, MPO/prot-3 Ab's neg, ANA neg, hep C /hep B neg, C4 borderline low, C3 normal  UA- 11-20 wbc, 3-6rbc, +many yeast, rare bact UNa- 78  Assessment/Rec: 1. Acute renal failure ? ATN ?AIN-  Serologic studies all negative, doubt glomerular lesion, prob ATN vs AIN. Marginal UOP, despite highdose diuretics po- mild uremic symptoms, rising creat, will hold off on dialysis as long as possible, limit fluids, hope for ATN recovery.  2. Lt leg cellulitis 3. DM 4. HTN- on metoprolol 12.5 bid and Imdur 30/d, may need to d/c metoprolol 5. Hx CAD/MI, cath x 3 6. Morbid obesity 7. Hx of PE   Vinson Moselle  MD [facility] [phone] pgr    [phone] cell 07/18/2012, 7:54 AM

## 2012-07-18 NOTE — Progress Notes (Signed)
ANTICOAGULATION CONSULT NOTE - Follow Up Consult  Pharmacy Consult for Warfarin Indication: h/o PE/DVT s/p IVC filter placement  Allergies  Allergen Reactions  . Bee Venom   . Codeine Palpitations    Per patient, "Feels like I'm having a heart attack."    Patient Measurements: Height: 5\' 7"  (170.2 cm) Weight: 309 lb 3.2 oz (140.252 kg) (bed scale ) IBW/kg (Calculated) : 61.6   Vital Signs: Temp: 97.9 F (36.6 C) (12/07 1108) Temp src: Oral (12/07 1108) BP: 138/57 mmHg (12/07 1108) Pulse Rate: 81  (12/07 1108)  Labs:  Basename 07/18/12 0605 07/17/12 0931 07/17/12 0450 07/16/12 0658  HGB 8.6* -- -- 9.7*  HCT 27.7* -- -- 31.2*  PLT 162 -- -- 188  APTT -- -- -- --  LABPROT 32.9* -- 27.2* 24.8*  INR 3.47* -- 2.68* 2.37*  HEPARINUNFRC -- -- -- --  CREATININE 5.17* 4.78* -- 4.75*  CKTOTAL -- -- -- --  CKMB -- -- -- --  TROPONINI -- -- -- --    Estimated Creatinine Clearance: 14.9 ml/min (by C-G formula based on Cr of 5.17).  Assessment: Pt is a 71 y/o F transferred from [facility] hospital with acute renal failure and cellulitis who is on chronic warfarin for h/o PE and DVT s/p IVC filter, to continue warfarin per pharmacy. Warfarin had been held since 12/1 for elevated INRs and was resumed 12/5.  Today INR is 3.47  after 4 mg 12/5 and 3 mg 12/6. Her Hg has dropped from 9.7 to 8.6.  Her protime jumped 5.7 seconds.  Her INR is only slightly high but will be conservative 2nd drop in Hg.   No evidence of bleeding reported.  Her home dose is Coumadin 3 mg daily except 4on Tue/Thu.  Goal of Therapy:  INR 2-3   Plan:  - no coumadin today - Daily PT/INR - Monitor closely for bleeding - Trend renal function  Thank you for the consult,  Herby Abraham, Pharm.D. [phone] 07/18/2012 11:35 AM

## 2012-07-18 NOTE — Progress Notes (Signed)
[facility] PROGRESS NOTE  [name] [name] RUE:[id number] DOB: Apr 15, 1941 DOA: 07/15/2012 PCP: [name] Boston, MD  Assessment/Plan: 1. ARF - developed during admission at [facility] while patient was taking Vancomycin and Zosyn. Patient was admitted with hypotension at [facility]. Patient with known normal creatinine on 08/20/2011. Patient may have ATN vs AIN. urine sediment bland, no significant proteinuria, urine for eos pending . C3,C4,Hep B,C, ANA, ANCA all non diagnostic (negative or WNL)  Urine output 625 cc/24 hrs . Creatinine rising slowly.  2. HTN 3. Mild LLE Cellulitis - non toxic, improving, received iv Vanc and Zosyn at [facility] until 12/3 - switched to iv clinda upon transfer to [facility] on 12/4.  4. DM  5. 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 Placed TEDs 6. Chronic back pain with hx of multiple surgeries and chronic paraparesis - continue fentanyl patch at reduced dose   7. Hx of gastric bypass surgery  8. Volume overload - resumed lasix 07/17/12  9. Anemia due to acute illness and chronic ds.   Code Status: full Family Communication: patient  Disposition Plan: home   Consultants:  Renal   Procedures:  none  Antibiotics: Clindamycin 12/5  HPI/Subjective: Decreased apetite, fatigued   Objective: Filed Vitals:   07/17/12 1629 07/17/12 2042 07/18/12 0559 07/18/12 1108  BP: 106/54 98/52 100/48 138/57  Pulse: 77 75 64 81  Temp: 98.6 F (37 C) 98 F (36.7 C) 98 F (36.7 C) 97.9 F (36.6 C)  TempSrc:   Oral Oral  Resp: 17 17 16 20   Height:      Weight:  140.252 kg (309 lb 3.2 oz)    SpO2: 98% 98% 97% 98%    Intake/Output Summary (Last 24 hours) at 07/18/12 1546 Last data filed at 07/18/12 1100  Gross per 24 hour  Intake    290 ml  Output    625 ml  Net   -335 ml   Filed Weights   07/15/12 2333 07/16/12 2155 07/17/12 2042  Weight: 138 kg (304 lb 3.8 oz) 138.075 kg (304 lb 6.4 oz) 140.252 kg  (309 lb 3.2 oz)    Exam:   General:  axox3  Cardiovascular: rrr, nl S1, S2   Respiratory: ctab, no w,r,c   Abdomen: soft, nt  LE bilat edema +3, LLE with minimal erythema   Data Reviewed: Basic Metabolic Panel:  Lab 07/18/12 1478 07/17/12 0931 07/16/12 0658  NA 136 137 138  K 4.8 5.0 4.8  CL 108 108 110  CO2 20 21 22   GLUCOSE 87 147* 90  BUN 29* 27* 27*  CREATININE 5.17* 4.78* 4.75*  CALCIUM 7.8* 8.1* 8.3*  MG -- -- 1.9  PHOS 5.0* -- 4.3   Liver Function Tests:  Lab 07/18/12 0605 07/16/12 0658  AST -- 22  ALT -- <5  ALKPHOS -- 97  BILITOT -- 0.2*  PROT -- 5.5*  ALBUMIN 2.0* 2.0*   No results found for this basename: LIPASE:5,AMYLASE:5 in the last 168 hours No results found for this basename: AMMONIA:5 in the last 168 hours CBC:  Lab 07/18/12 0605 07/16/12 0658  WBC 6.2 4.9  NEUTROABS 3.5 --  HGB 8.6* 9.7*  HCT 27.7* 31.2*  MCV 84.7 85.5  PLT 162 188   Cardiac Enzymes: No results found for this basename: CKTOTAL:5,CKMB:5,CKMBINDEX:5,TROPONINI:5 in the last 168 hours BNP (last 3 results) No results found for this basename: PROBNP:3 in the last 8760 hours CBG:  Lab 07/18/12  1147 07/18/12 0736 07/17/12 2135 07/17/12 1629 07/17/12 1136  GLUCAP 99 84 84 100* 92    No results found for this or any previous visit (from the past 240 hour(s)).   Studies: No results found. Results for [name], [name] (MRN [id number]) as of 07/17/2012 07:56  Ref. Range 07/16/2012 15:55  Color, Urine Latest Range: YELLOW  YELLOW  APPearance Latest Range: CLEAR  CLOUDY (A)  Specific Gravity, Urine Latest Range: 1.005-1.030  1.010  pH Latest Range: 5.0-8.0  5.0  Glucose Latest Range: NEGATIVE mg/dL NEGATIVE  Bilirubin Urine Latest Range: NEGATIVE  SMALL (A)  Ketones, ur Latest Range: NEGATIVE mg/dL NEGATIVE  Protein Latest Range: NEGATIVE mg/dL NEGATIVE  Urobilinogen, UA Latest Range: 0.0-1.0 mg/dL 0.2  Nitrite Latest Range: NEGATIVE  NEGATIVE  Leukocytes, UA Latest Range:  NEGATIVE  MODERATE (A)  Hgb urine dipstick Latest Range: NEGATIVE  SMALL (A)  Urine-Other No range found MANY YEAST  WBC, UA Latest Range: <3 WBC/hpf 11-20  RBC / HPF Latest Range: <3 RBC/hpf 3-6  Squamous Epithelial / LPF Latest Range: RARE  RARE  Bacteria, UA Latest Range: RARE  RARE   Scheduled Meds:    . citalopram  20 mg Oral Daily  . clindamycin (CLEOCIN) IV  600 mg Intravenous Q6H  . docusate sodium  100 mg Oral BID  . fentaNYL  50 mcg Transdermal Q72H  . furosemide  160 mg Oral BID  . gabapentin  100 mg Oral TID  . insulin aspart  0-5 Units Subcutaneous QHS  . insulin aspart  0-9 Units Subcutaneous TID WC  . pantoprazole  40 mg Oral Daily  . sodium chloride  3 mL Intravenous Q12H  . sodium chloride  3 mL Intravenous Q12H  . [COMPLETED] warfarin  3 mg Oral ONCE-1800  . Warfarin - Pharmacist Dosing Inpatient   Does not apply q1800  . [DISCONTINUED] isosorbide mononitrate  30 mg Oral Daily  . [DISCONTINUED] isosorbide mononitrate  30 mg Oral Daily  . [DISCONTINUED] metoprolol tartrate  12.5 mg Oral BID   Continuous Infusions:   Active Problems:  DM  Essential hypertension, benign  Cellulitis  Acute renal failure  History of DVT of lower extremity  History of pulmonary embolism       [name] [name]  [facility] Pager [phone]. If 8PM-8AM, please contact night-coverage at [email or web address], password [facility] 07/18/2012, 3:46 PM  LOS: 3 days

## 2012-07-19 ENCOUNTER — Inpatient Hospital Stay (HOSPITAL_COMMUNITY): Payer: Medicare Other

## 2012-07-19 DIAGNOSIS — Z86711 Personal history of pulmonary embolism: Secondary | ICD-10-CM

## 2012-07-19 DIAGNOSIS — Z86718 Personal history of other venous thrombosis and embolism: Secondary | ICD-10-CM

## 2012-07-19 LAB — GLUCOSE, CAPILLARY: Glucose-Capillary: 97 mg/dL (ref 70–99)

## 2012-07-19 LAB — RENAL FUNCTION PANEL
Albumin: 2.1 g/dL — ABNORMAL LOW (ref 3.5–5.2)
Chloride: 108 mEq/L (ref 96–112)
Creatinine, Ser: 5.16 mg/dL — ABNORMAL HIGH (ref 0.50–1.10)
GFR calc Af Amer: 9 mL/min — ABNORMAL LOW (ref >90)
GFR calc non Af Amer: 8 mL/min — ABNORMAL LOW (ref >90)
Sodium: 139 mEq/L (ref 135–145)

## 2012-07-19 LAB — PROTIME-INR
INR: 3.75 — ABNORMAL HIGH (ref 0.00–1.49)
Prothrombin Time: 34.9 seconds — ABNORMAL HIGH (ref 11.6–15.2)

## 2012-07-19 NOTE — Progress Notes (Signed)
ANTICOAGULATION CONSULT NOTE - Follow Up Consult  Pharmacy Consult for Warfarin Indication: h/o PE/DVT s/p IVC filter placement  Allergies  Allergen Reactions  . Bee Venom   . Codeine Palpitations    Per patient, "Feels like I'm having a heart attack."    Patient Measurements: Height: 5\' 7"  (170.2 cm) Weight: 304 lb 12.8 oz (138.256 kg) (bed scale) IBW/kg (Calculated) : 61.6   Vital Signs: Temp: 98.2 F (36.8 C) (12/08 1022) Temp src: Oral (12/08 1022) BP: 131/58 mmHg (12/08 1022) Pulse Rate: 82  (12/08 1022)  Labs:  Basename 07/19/12 0510 07/18/12 0605 07/17/12 0931 07/17/12 0450  HGB -- 8.6* -- --  HCT -- 27.7* -- --  PLT -- 162 -- --  APTT -- -- -- --  LABPROT 34.9* 32.9* -- 27.2*  INR 3.75* 3.47* -- 2.68*  HEPARINUNFRC -- -- -- --  CREATININE 5.16* 5.17* 4.78* --  CKTOTAL -- -- -- --  CKMB -- -- -- --  TROPONINI -- -- -- --    Estimated Creatinine Clearance: 14.8 ml/min (by C-G formula based on Cr of 5.16).  Assessment: Pt is a 71 y/o F transferred from [facility] hospital with acute renal failure and cellulitis who is on chronic warfarin for h/o PE and DVT s/p IVC filter, to continue warfarin per pharmacy. Warfarin had been held since 12/1 for elevated INRs and was resumed 12/5.  Today INR is 3.75 after her dose was held yesterday and 4 mg 12/5 and 3 mg 12/6. Her Hg dropped from 9.7 to 8.6 yesterday.   No evidence of bleeding reported.  Her home dose is Coumadin 3 mg daily except 4 on Tue/Thu.  Goal of Therapy:  INR 2-3   Plan:  - no coumadin again today - Daily PT/INR - Monitor closely for bleeding - Trend renal function  Thank you for the consult,  Herby Abraham, Pharm.D. [phone] 07/19/2012 12:01 PM

## 2012-07-19 NOTE — Progress Notes (Signed)
Subjective: BP better and UOP better, 2425cc yesterday on bid lasix 160  Objective Vital signs in last 24 hours: Filed Vitals:   07/18/12 1400 07/18/12 1800 07/18/12 2126 07/19/12 0443  BP: 151/66 143/59 149/64 120/50  Pulse: 87 85 85 71  Temp: 97.7 F (36.5 C) 97.7 F (36.5 C) 99.1 F (37.3 C) 98.4 F (36.9 C)  TempSrc: Oral Oral Axillary Oral  Resp: 20 20 20 18   Height:      Weight:   138.256 kg (304 lb 12.8 oz)   SpO2: 99% 97% 99% 99%   Weight change: -1.996 kg (-4 lb 6.4 oz)  Intake/Output Summary (Last 24 hours) at 07/19/12 0634 Last data filed at 07/19/12 0400  Gross per 24 hour  Intake    800 ml  Output   2425 ml  Net  -1625 ml   Labs: Basic Metabolic Panel:  Lab 07/18/12 1610 07/17/12 0931 07/16/12 0658  NA 136 137 138  K 4.8 5.0 4.8  CL 108 108 110  CO2 20 21 22   GLUCOSE 87 147* 90  BUN 29* 27* 27*  CREATININE 5.17* 4.78* 4.75*  ALB -- -- --  CALCIUM 7.8* 8.1* 8.3*  PHOS 5.0* -- 4.3   Liver Function Tests:  Lab 07/18/12 0605 07/16/12 0658  AST -- 22  ALT -- <5  ALKPHOS -- 97  BILITOT -- 0.2*  PROT -- 5.5*  ALBUMIN 2.0* 2.0*   No results found for this basename: LIPASE:3,AMYLASE:3 in the last 168 hours No results found for this basename: AMMONIA:3 in the last 168 hours CBC:  Lab 07/18/12 0605 07/16/12 0658  WBC 6.2 4.9  NEUTROABS 3.5 --  HGB 8.6* 9.7*  HCT 27.7* 31.2*  MCV 84.7 85.5  PLT 162 188   PT/INR: @labrcntip (inr:5) Cardiac Enzymes: No results found for this basename: CKTOTAL:5,CKMB:5,CKMBINDEX:5,TROPONINI:5 in the last 168 hours CBG:  Lab 07/18/12 2149 07/18/12 1649 07/18/12 1147 07/18/12 0736 07/17/12 2135  GLUCAP 93 98 99 84 84    Iron Studies: No results found for this basename: IRON:30,TIBC:30,TRANSFERRIN:30,FERRITIN:30 in the last 168 hours  Physical Exam:  Blood pressure 120/50, pulse 71, temperature 98.4 F (36.9 C), temperature source Oral, resp. rate 18, height 5\' 7"  (1.702 m), weight 138.256 kg (304 lb 12.8 oz),  SpO2 99.00%.  RUE:AVWUJ and alert CVS:RRR Resp:clear Abd:+BS NTND Ext: 2+ edema LE's Erythema Lt lower ext improving NEURO:CNI OX3 no asterixis  UPC ratio- 0.14 MPO/prot-3 Ab's neg, ANA neg, hep C /hep B neg, C4 borderline low, C3 normal  UA- no protein, 11-20 wbc, 3-6rbc, +many yeast, rare bact UNa- 78  Assessment/Rec: 1. Acute renal failure ? ATN ?AIN-  Serologic studies all negative, no proteinuria. Prob ATN vs AIN.  BP much better and UOP improved, creat leveling off. May be early recovery. No need for dialysis. Cont lasix for now but if renal improvement stalls may need to d/c. Keep off BP meds for now also.  2. Lt leg cellulitis 3. DM 4. Hx CAD/MI, cath x 3- d/c'd metoprolol for low BP's. BP better 5. Morbid obesity 6. Hx of PE   Vinson Moselle  MD [facility] [phone] pgr    [phone] cell 07/19/2012, 6:34 AM

## 2012-07-19 NOTE — Progress Notes (Signed)
[facility] PROGRESS NOTE  BIRD TAILOR WUJ:[id number] DOB: 31-May-1941 DOA: 07/15/2012 PCP: Louie Boston, MD  Assessment/Plan: 1. ARF - developed during admission at [facility] while patient was taking Vancomycin and Zosyn. Patient was admitted with hypotension at [facility]. Patient with known normal creatinine on 08/20/2011. Patient may have ATN vs AIN. urine sediment bland, no significant proteinuria, urine for eos pending . C3,C4,Hep B,C, ANA, ANCA all non diagnostic (negative or WNL)  Urine output 2425 cc/24 hrs . Creatinine plateau  2. HTN 3. Mild LLE Cellulitis - non toxic, improving, received iv Vanc and Zosyn at [facility] until 12/3 - switched to iv clinda upon transfer to [facility] on 12/4.  4. DM  5. 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 Placed TEDs 6. Chronic back pain with hx of multiple surgeries and chronic paraparesis - continue fentanyl patch at reduced dose   7. Hx of gastric bypass surgery  8. Volume overload - resumed lasix 07/17/12  9. Anemia due to acute illness and chronic ds.   Code Status: full Family Communication: patient  Disposition Plan: home   Consultants:  Renal   Procedures:  none  Antibiotics: Clindamycin 12/5  HPI/Subjective: Feels better today   Objective: Filed Vitals:   07/18/12 1400 07/18/12 1800 07/18/12 2126 07/19/12 0443  BP: 151/66 143/59 149/64 120/50  Pulse: 87 85 85 71  Temp: 97.7 F (36.5 C) 97.7 F (36.5 C) 99.1 F (37.3 C) 98.4 F (36.9 C)  TempSrc: Oral Oral Axillary Oral  Resp: 20 20 20 18   Height:      Weight:   138.256 kg (304 lb 12.8 oz)   SpO2: 99% 97% 99% 99%    Intake/Output Summary (Last 24 hours) at 07/19/12 0813 Last data filed at 07/19/12 0400  Gross per 24 hour  Intake    800 ml  Output   2425 ml  Net  -1625 ml   Filed Weights   07/16/12 2155 07/17/12 2042 07/18/12 2126  Weight: 138.075 kg (304 lb 6.4 oz) 140.252 kg (309 lb 3.2 oz)  138.256 kg (304 lb 12.8 oz)    Exam:   General:  axox3  Cardiovascular: rrr, nl S1, S2   Respiratory: ctab, no w,r,c   Abdomen: soft, nt  LE bilat edema +3, LLE with minimal erythema   Data Reviewed: Basic Metabolic Panel:  Lab 07/19/12 9562 07/18/12 0605 07/17/12 0931 07/16/12 0658  NA 139 136 137 138  K 4.2 4.8 5.0 4.8  CL 108 108 108 110  CO2 21 20 21 22   GLUCOSE 91 87 147* 90  BUN 31* 29* 27* 27*  CREATININE 5.16* 5.17* 4.78* 4.75*  CALCIUM 7.9* 7.8* 8.1* 8.3*  MG -- -- -- 1.9  PHOS 5.2* 5.0* -- 4.3   Liver Function Tests:  Lab 07/19/12 0510 07/18/12 0605 07/16/12 0658  AST -- -- 22  ALT -- -- <5  ALKPHOS -- -- 97  BILITOT -- -- 0.2*  PROT -- -- 5.5*  ALBUMIN 2.1* 2.0* 2.0*   No results found for this basename: LIPASE:5,AMYLASE:5 in the last 168 hours No results found for this basename: AMMONIA:5 in the last 168 hours CBC:  Lab 07/18/12 0605 07/16/12 0658  WBC 6.2 4.9  NEUTROABS 3.5 --  HGB 8.6* 9.7*  HCT 27.7* 31.2*  MCV 84.7 85.5  PLT 162 188   Cardiac Enzymes: No results found for this basename: CKTOTAL:5,CKMB:5,CKMBINDEX:5,TROPONINI:5 in the last 168 hours BNP (last 3  results) No results found for this basename: PROBNP:3 in the last 8760 hours CBG:  Lab 07/19/12 0736 07/18/12 2149 07/18/12 1649 07/18/12 1147 07/18/12 0736  GLUCAP 87 93 98 99 84    No results found for this or any previous visit (from the past 240 hour(s)).   Studies: No results found. Results for KIALEE, KHAM (MRN [id number]) as of 07/17/2012 07:56  Ref. Range 07/16/2012 15:55  Color, Urine Latest Range: YELLOW  YELLOW  APPearance Latest Range: CLEAR  CLOUDY (A)  Specific Gravity, Urine Latest Range: 1.005-1.030  1.010  pH Latest Range: 5.0-8.0  5.0  Glucose Latest Range: NEGATIVE mg/dL NEGATIVE  Bilirubin Urine Latest Range: NEGATIVE  SMALL (A)  Ketones, ur Latest Range: NEGATIVE mg/dL NEGATIVE  Protein Latest Range: NEGATIVE mg/dL NEGATIVE  Urobilinogen, UA Latest  Range: 0.0-1.0 mg/dL 0.2  Nitrite Latest Range: NEGATIVE  NEGATIVE  Leukocytes, UA Latest Range: NEGATIVE  MODERATE (A)  Hgb urine dipstick Latest Range: NEGATIVE  SMALL (A)  Urine-Other No range found MANY YEAST  WBC, UA Latest Range: <3 WBC/hpf 11-20  RBC / HPF Latest Range: <3 RBC/hpf 3-6  Squamous Epithelial / LPF Latest Range: RARE  RARE  Bacteria, UA Latest Range: RARE  RARE   Scheduled Meds:    . citalopram  20 mg Oral Daily  . clindamycin (CLEOCIN) IV  600 mg Intravenous Q6H  . docusate sodium  100 mg Oral BID  . fentaNYL  50 mcg Transdermal Q72H  . furosemide  160 mg Oral BID  . gabapentin  100 mg Oral TID  . insulin aspart  0-5 Units Subcutaneous QHS  . insulin aspart  0-9 Units Subcutaneous TID WC  . pantoprazole  40 mg Oral Daily  . sodium chloride  3 mL Intravenous Q12H  . sodium chloride  3 mL Intravenous Q12H  . Warfarin - Pharmacist Dosing Inpatient   Does not apply q1800  . [DISCONTINUED] isosorbide mononitrate  30 mg Oral Daily  . [DISCONTINUED] isosorbide mononitrate  30 mg Oral Daily  . [DISCONTINUED] metoprolol tartrate  12.5 mg Oral BID   Continuous Infusions:   Active Problems:  DM  Essential hypertension, benign  Cellulitis  Acute renal failure  History of DVT of lower extremity  History of pulmonary embolism       Shenice Dolder  [facility] Pager [phone]. If 8PM-8AM, please contact night-coverage at [email or web address], password [facility] 07/19/2012, 8:13 AM  LOS: 4 days

## 2012-07-20 LAB — RENAL FUNCTION PANEL
Albumin: 2 g/dL — ABNORMAL LOW (ref 3.5–5.2)
Calcium: 7.9 mg/dL — ABNORMAL LOW (ref 8.4–10.5)
Creatinine, Ser: 5.02 mg/dL — ABNORMAL HIGH (ref 0.50–1.10)
GFR calc non Af Amer: 8 mL/min — ABNORMAL LOW (ref >90)

## 2012-07-20 LAB — CBC
MCH: 25.8 pg — ABNORMAL LOW (ref 26.0–34.0)
MCHC: 31.2 g/dL (ref 30.0–36.0)
Platelets: 152 10*3/uL (ref 150–400)
RDW: 16 % — ABNORMAL HIGH (ref 11.5–15.5)

## 2012-07-20 LAB — PROTIME-INR
INR: 3.1 — ABNORMAL HIGH (ref 0.00–1.49)
Prothrombin Time: 30.3 seconds — ABNORMAL HIGH (ref 11.6–15.2)

## 2012-07-20 LAB — GLUCOSE, CAPILLARY
Glucose-Capillary: 89 mg/dL (ref 70–99)
Glucose-Capillary: 89 mg/dL (ref 70–99)

## 2012-07-20 MED ORDER — WARFARIN SODIUM 3 MG PO TABS
3.0000 mg | ORAL_TABLET | Freq: Once | ORAL | Status: AC
  Administered 2012-07-20: 3 mg via ORAL
  Filled 2012-07-20: qty 1

## 2012-07-20 NOTE — Progress Notes (Signed)
[facility] PROGRESS NOTE  [name] [name]:[id number] DOB: [birth year] DOA: 07/15/2012 PCP: [name] Boston, MD  Assessment/Plan: 1. ARF - developed during admission at [facility] while patient was taking Vancomycin and Zosyn. Patient was admitted with hypotension at [facility]. Patient with known normal creatinine on 08/20/2011. Patient may have ATN vs AIN. urine sediment bland, no significant proteinuria, urine for eos pending . C3,C4,Hep B,C, ANA, ANCA all non diagnostic (negative or WNL)  Urine output 3301 cc/24 hrs . Patient in the polyuric phase starting 07/19/12 Creatinine plateau.   2. HTN 3. Mild LLE Cellulitis - non toxic, improving, received iv Vanc and Zosyn at [facility] until 12/3 - switched to iv clinda upon transfer to [facility] on 12/4.  4. DM  5. 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 Placed TEDs 6. Chronic back pain with hx of multiple surgeries and chronic paraparesis - continue fentanyl patch at reduced dose   7. Hx of gastric bypass surgery  8. Volume overload - resumed lasix 07/17/12 - stopped Lasix 07/20/12 once patient started being in the polyuric phase  9. Anemia due to acute illness and chronic ds.   Code Status: full Family Communication: patient  Disposition Plan: home   Consultants:  Renal   Procedures:  none  Antibiotics: Clindamycin 12/5  HPI/Subjective: Feels better today   Objective: Filed Vitals:   07/19/12 1800 07/19/12 2047 07/20/12 0529 07/20/12 0902  BP: 123/46 122/87 115/64 141/56  Pulse: 79 79 73 77  Temp: 97.9 F (36.6 C) 97.9 F (36.6 C) 97.7 F (36.5 C) 98.3 F (36.8 C)  TempSrc: Oral Oral Oral   Resp: 20 20 20 20   Height:      Weight:  138.347 kg (305 lb)    SpO2: 100% 100% 98% 98%    Intake/Output Summary (Last 24 hours) at 07/20/12 1206 Last data filed at 07/20/12 4401  Gross per 24 hour  Intake 1493.33 ml  Output   3650 ml  Net -2156.67 ml   Filed Weights    07/17/12 2042 07/18/12 2126 07/19/12 2047  Weight: 140.252 kg (309 lb 3.2 oz) 138.256 kg (304 lb 12.8 oz) 138.347 kg (305 lb)    Exam:   General:  axox3  Cardiovascular: rrr, nl S1, S2   Respiratory: ctab, no w,r,c   Abdomen: soft, nt  LE bilat edema +1, patient with bilat ace wraps ,  LLE with minimal erythema   Data Reviewed: Basic Metabolic Panel:  Lab 07/20/12 0272 07/19/12 0510 07/18/12 0605 07/17/12 0931 07/16/12 0658  NA 139 139 136 137 138  K 4.1 4.2 4.8 5.0 4.8  CL 108 108 108 108 110  CO2 24 21 20 21 22   GLUCOSE 88 91 87 147* 90  BUN 31* 31* 29* 27* 27*  CREATININE 5.02* 5.16* 5.17* 4.78* 4.75*  CALCIUM 7.9* 7.9* 7.8* 8.1* 8.3*  MG -- -- -- -- 1.9  PHOS 5.3* 5.2* 5.0* -- 4.3   Liver Function Tests:  Lab 07/20/12 0520 07/19/12 0510 07/18/12 0605 07/16/12 0658  AST -- -- -- 22  ALT -- -- -- <5  ALKPHOS -- -- -- 97  BILITOT -- -- -- 0.2*  PROT -- -- -- 5.5*  ALBUMIN 2.0* 2.1* 2.0* 2.0*   No results found for this basename: LIPASE:5,AMYLASE:5 in the last 168 hours No results found for this basename: AMMONIA:5 in the last 168 hours CBC:  Lab 07/20/12 0520 07/18/12 0605 07/16/12 0658  WBC  4.3 6.2 4.9  NEUTROABS -- 3.5 --  HGB 8.8* 8.6* 9.7*  HCT 28.2* 27.7* 31.2*  MCV 82.7 84.7 85.5  PLT 152 162 188   Cardiac Enzymes: No results found for this basename: CKTOTAL:5,CKMB:5,CKMBINDEX:5,TROPONINI:5 in the last 168 hours BNP (last 3 results) No results found for this basename: PROBNP:3 in the last 8760 hours CBG:  Lab 07/20/12 1134 07/20/12 0734 07/19/12 2051 07/19/12 1632 07/19/12 1142  GLUCAP 89 89 88 97 90    No results found for this or any previous visit (from the past 240 hour(s)).   Studies: Dg Chest Port 1 View  07/19/2012  *RADIOLOGY REPORT*  Clinical Data: Questionable edema.  Acute renal failure.  Fever and weakness.  PORTABLE CHEST - 1 VIEW  Comparison: 07/07/2012  Findings: The cardiac silhouette is normal in size and configuration.  No  mediastinal or hilar mass or adenopathy.  Minor linear opacity in the left lateral lower lung is likely subsegmental atelectasis or scarring.  The lungs otherwise clear. No pleural effusion or pneumothorax.  The bony thorax is demineralized.  IMPRESSION: No acute cardiopulmonary disease.   Original Report Authenticated By: [name] [name], M.D.    Results for [name], [name] (MRN [id number]) as of 07/17/2012 07:56  Ref. Range 07/16/2012 15:55  Color, Urine Latest Range: YELLOW  YELLOW  APPearance Latest Range: CLEAR  CLOUDY (A)  Specific Gravity, Urine Latest Range: 1.005-1.030  1.010  pH Latest Range: 5.0-8.0  5.0  Glucose Latest Range: NEGATIVE mg/dL NEGATIVE  Bilirubin Urine Latest Range: NEGATIVE  SMALL (A)  Ketones, ur Latest Range: NEGATIVE mg/dL NEGATIVE  Protein Latest Range: NEGATIVE mg/dL NEGATIVE  Urobilinogen, UA Latest Range: 0.0-1.0 mg/dL 0.2  Nitrite Latest Range: NEGATIVE  NEGATIVE  Leukocytes, UA Latest Range: NEGATIVE  MODERATE (A)  Hgb urine dipstick Latest Range: NEGATIVE  SMALL (A)  Urine-Other No range found MANY YEAST  WBC, UA Latest Range: <3 WBC/hpf 11-20  RBC / HPF Latest Range: <3 RBC/hpf 3-6  Squamous Epithelial / LPF Latest Range: RARE  RARE  Bacteria, UA Latest Range: RARE  RARE   Scheduled Meds:    . citalopram  20 mg Oral Daily  . clindamycin (CLEOCIN) IV  600 mg Intravenous Q6H  . docusate sodium  100 mg Oral BID  . fentaNYL  50 mcg Transdermal Q72H  . gabapentin  100 mg Oral TID  . insulin aspart  0-5 Units Subcutaneous QHS  . insulin aspart  0-9 Units Subcutaneous TID WC  . pantoprazole  40 mg Oral Daily  . sodium chloride  3 mL Intravenous Q12H  . sodium chloride  3 mL Intravenous Q12H  . Warfarin - Pharmacist Dosing Inpatient   Does not apply q1800  . [DISCONTINUED] furosemide  160 mg Oral BID   Continuous Infusions:   Active Problems:  DM  Essential hypertension, benign  Cellulitis  Acute renal failure  History of DVT of lower extremity   History of pulmonary embolism       [name] [name]  [facility] Pager [phone]. If 8PM-8AM, please contact night-coverage at [email or web address], password [facility] 07/20/2012, 12:06 PM  LOS: 5 days

## 2012-07-20 NOTE — Progress Notes (Signed)
ANTICOAGULATION CONSULT NOTE - Follow Up Consult  Pharmacy Consult for Warfarin Indication: Hx PE/DVT s/p IVC filter placement  Allergies  Allergen Reactions  . Bee Venom   . Codeine Palpitations    Per patient, "Feels like I'm having a heart attack."    Patient Measurements: Height: 5\' 7"  (170.2 cm) Weight: 305 lb (138.347 kg) IBW/kg (Calculated) : 61.6   Vital Signs: Temp: 98.3 F (36.8 C) (12/09 0902) Temp src: Oral (12/09 0529) BP: 141/56 mmHg (12/09 0902) Pulse Rate: 77  (12/09 0902)  Labs:  Basename 07/20/12 0520 07/19/12 0510 07/18/12 0605  HGB 8.8* -- 8.6*  HCT 28.2* -- 27.7*  PLT 152 -- 162  APTT -- -- --  LABPROT 30.3* 34.9* 32.9*  INR 3.10* 3.75* 3.47*  HEPARINUNFRC -- -- --  CREATININE 5.02* 5.16* 5.17*  CKTOTAL -- -- --  CKMB -- -- --  TROPONINI -- -- --    Estimated Creatinine Clearance: 15.2 ml/min (by C-G formula based on Cr of 5.02).  Assessment:  71 y.o. 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 INR today remains slightly SUPRAtherapeutic despite holding x 2 days though trending down (INR 3.1 << 3.75, goal of 2-3). Hgb/Hct/Plt stable. No s/sx of bleeding noted at this time. Will plan to resume warfarin today to try to prevent further drops in INR below the therapeutic range.   Previously, the patient was known to be taking 3 mg daily EXCEPT for 4 mg on Tues/Thurs.   Goal of Therapy:  INR 2-3   Plan: 1. Warfarin 3 mg x 1 dose at 1800 today 2. Will continue to monitor for any signs/symptoms of bleeding and will follow up with PT/INR in the a.m.  Georgina Pillion, PharmD, BCPS Clinical Pharmacist Pager: [phone] 07/20/2012 12:24 PM

## 2012-07-20 NOTE — Progress Notes (Signed)
Assessment/Rec:  1. Acute renal failure. Diuresing.  Serologic studies all negative, no proteinuria.  BP much better and UOP improved, creat leveling off. May be early recovery. No need for dialysis. Stop Furosemide 2. Lt leg cellulitis 3. DM 4. Hx CAD/MI, cath x 3- d/c'd metoprolol for low BP's. BP better 5. Morbid obesity 6. Hx of PE  Subjective: Interval History: Good UOP. CXR neg  Objective: Vital signs in last 24 hours: Temp:  [97.7 F (36.5 C)-98.3 F (36.8 C)] 98.3 F (36.8 C) (12/09 0902) Pulse Rate:  [73-82] 77  (12/09 0902) Resp:  [20] 20  (12/09 0902) BP: (115-141)/(46-87) 141/56 mmHg (12/09 0902) SpO2:  [98 %-100 %] 98 % (12/09 0902) Weight:  [138.347 kg (305 lb)] 138.347 kg (305 lb) (12/08 2047) Weight change: 0.091 kg (3.2 oz)  Intake/Output from previous day: 12/08 0701 - 12/09 0700 In: 1254.3 [P.O.:841; I.V.:163.3; IV Piggyback:250] Out: 3301 [Urine:3300; Stool:1] Intake/Output this shift: Total I/O In: 480 [P.O.:480] Out: 1000 [Urine:1000]  General appearance: alert, cooperative, appears stated age and morbidly obese Resp: clear to auscultation bilaterally Chest wall: no tenderness Cardio: regular rate and rhythm, S1, S2 normal, no murmur, click, rub or gallop Incision/Wound: legs wrapped bilaterally Abd obese  Lab Results:  Basename 07/20/12 0520 07/18/12 0605  WBC 4.3 6.2  HGB 8.8* 8.6*  HCT 28.2* 27.7*  PLT 152 162   BMET:  Basename 07/20/12 0520 07/19/12 0510  NA 139 139  K 4.1 4.2  CL 108 108  CO2 24 21  GLUCOSE 88 91  BUN 31* 31*  CREATININE 5.02* 5.16*  CALCIUM 7.9* 7.9*   No results found for this basename: PTH:2 in the last 72 hours Iron Studies: No results found for this basename: IRON,TIBC,TRANSFERRIN,FERRITIN in the last 72 hours Studies/Results: Dg Chest Port 1 View  07/19/2012  *RADIOLOGY REPORT*  Clinical Data: Questionable edema.  Acute renal failure.  Fever and weakness.  PORTABLE CHEST - 1 VIEW  Comparison: 07/07/2012   Findings: The cardiac silhouette is normal in size and configuration.  No mediastinal or hilar mass or adenopathy.  Minor linear opacity in the left lateral lower lung is likely subsegmental atelectasis or scarring.  The lungs otherwise clear. No pleural effusion or pneumothorax.  The bony thorax is demineralized.  IMPRESSION: No acute cardiopulmonary disease.   Original Report Authenticated By: Amie Portland, M.D.    Scheduled:   . citalopram  20 mg Oral Daily  . clindamycin (CLEOCIN) IV  600 mg Intravenous Q6H  . docusate sodium  100 mg Oral BID  . fentaNYL  50 mcg Transdermal Q72H  . furosemide  160 mg Oral BID  . gabapentin  100 mg Oral TID  . insulin aspart  0-5 Units Subcutaneous QHS  . insulin aspart  0-9 Units Subcutaneous TID WC  . pantoprazole  40 mg Oral Daily  . sodium chloride  3 mL Intravenous Q12H  . sodium chloride  3 mL Intravenous Q12H  . Warfarin - Pharmacist Dosing Inpatient   Does not apply q1800    Jarrett Chicoine C 07/20/2012,9:38 AM

## 2012-07-21 LAB — RENAL FUNCTION PANEL
CO2: 25 mEq/L (ref 19–32)
Calcium: 7.9 mg/dL — ABNORMAL LOW (ref 8.4–10.5)
Chloride: 107 mEq/L (ref 96–112)
GFR calc Af Amer: 9 mL/min — ABNORMAL LOW (ref >90)
GFR calc non Af Amer: 8 mL/min — ABNORMAL LOW (ref >90)
Glucose, Bld: 99 mg/dL (ref 70–99)
Potassium: 4.5 mEq/L (ref 3.5–5.1)
Sodium: 141 mEq/L (ref 135–145)

## 2012-07-21 LAB — GLUCOSE, CAPILLARY
Glucose-Capillary: 84 mg/dL (ref 70–99)
Glucose-Capillary: 100 mg/dL — ABNORMAL HIGH (ref 70–99)
Glucose-Capillary: 121 mg/dL — ABNORMAL HIGH (ref 70–99)

## 2012-07-21 LAB — PROTIME-INR: INR: 2.29 — ABNORMAL HIGH (ref 0.00–1.49)

## 2012-07-21 MED ORDER — LEVOTHYROXINE SODIUM 25 MCG PO TABS
25.0000 ug | ORAL_TABLET | Freq: Every day | ORAL | Status: DC
  Administered 2012-07-22 – 2012-07-25 (×4): 25 ug via ORAL
  Filled 2012-07-21 (×5): qty 1

## 2012-07-21 MED ORDER — WARFARIN SODIUM 3 MG PO TABS
3.0000 mg | ORAL_TABLET | Freq: Once | ORAL | Status: AC
  Administered 2012-07-21: 3 mg via ORAL
  Filled 2012-07-21: qty 1

## 2012-07-21 MED ORDER — SODIUM CHLORIDE 0.45 % IV SOLN
INTRAVENOUS | Status: DC
  Administered 2012-07-21: 1000 mL via INTRAVENOUS
  Administered 2012-07-21: 18:00:00 via INTRAVENOUS
  Administered 2012-07-22: 150 mL via INTRAVENOUS
  Administered 2012-07-22: 100 mL via INTRAVENOUS
  Administered 2012-07-22: 1000 mL via INTRAVENOUS
  Administered 2012-07-22: 17:00:00 via INTRAVENOUS
  Administered 2012-07-23: 1000 mL via INTRAVENOUS

## 2012-07-21 NOTE — Progress Notes (Signed)
ANTICOAGULATION CONSULT NOTE - Follow Up Consult  Pharmacy Consult for Warfarin Indication: Hx PE/DVT s/p IVC filter placement  Allergies  Allergen Reactions  . Bee Venom   . Codeine Palpitations    Per patient, "Feels like I'm having a heart attack."    Patient Measurements: Height: 5\' 7"  (170.2 cm) Weight: 294 lb 3.2 oz (133.448 kg) IBW/kg (Calculated) : 61.6   Vital Signs: Temp: 97.9 F (36.6 C) (12/10 0951) Temp src: Oral (12/10 0457) BP: 129/51 mmHg (12/10 0951) Pulse Rate: 81  (12/10 0951)  Labs:  Basename 07/21/12 0518 07/20/12 0520 07/19/12 0510  HGB -- 8.8* --  HCT -- 28.2* --  PLT -- 152 --  APTT -- -- --  LABPROT 24.2* 30.3* 34.9*  INR 2.29* 3.10* 3.75*  HEPARINUNFRC -- -- --  CREATININE 4.92* 5.02* 5.16*  CKTOTAL -- -- --  CKMB -- -- --  TROPONINI -- -- --    Estimated Creatinine Clearance: 15.2 ml/min (by C-G formula based on Cr of 4.92).  Assessment:  71 y.o. 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 INR today is therapeutic after resuming warfarin yesterday evening (INR 2.29 << 3.1, goal of 2-3). Pt has recent history of rapidly rising INR levels requiring doses to be held -- so will continue to dose cautiously. No CBC today No s/sx of bleeding noted at this time. The patient was re-educated on warfarin yesterday.   Previously, the patient was known to be taking 3 mg daily EXCEPT for 4 mg on Tues/Thurs.   Goal of Therapy:  INR 2-3   Plan: 1. Warfarin 3 mg x 1 dose at 1800 today 2. Will continue to monitor for any signs/symptoms of bleeding and will follow up with PT/INR in the a.m.  Georgina Pillion, PharmD, BCPS Clinical Pharmacist Pager: [phone] 07/21/2012 10:49 AM

## 2012-07-21 NOTE — Progress Notes (Signed)
Assessment/Rec:  1. Acute renal failure, polyuric recovery phase.   2. Lt leg cellulitis 3. DM 4. Hx CAD/MI, cath x 3 5. BP  6. Morbid obesity 7. Hx of PE  Plan: begin IVF support  Subjective: Interval History: No complaints  Objective: Vital signs in last 24 hours: Temp:  [97.7 F (36.5 C)-99.4 F (37.4 C)] 97.7 F (36.5 C) (12/10 0457) Pulse Rate:  [74-83] 74  (12/10 0457) Resp:  [18-20] 18  (12/10 0457) BP: (125-157)/(49-78) 125/50 mmHg (12/10 0457) SpO2:  [97 %-99 %] 98 % (12/10 0457) Weight:  [133.448 kg (294 lb 3.2 oz)] 133.448 kg (294 lb 3.2 oz) (12/09 2037) Weight change: -4.899 kg (-10 lb 12.8 oz)  Intake/Output from previous day: 12/09 0701 - 12/10 0700 In: 750 [P.O.:600; IV Piggyback:150] Out: 4650 [Urine:4650] Intake/Output this shift:    General appearance: alert and cooperative Resp: clear to auscultation bilaterally Chest wall: no tenderness Cardio: regular rate and rhythm, S1, S2 normal, no murmur, click, rub or gallop legs wrapped  Lab Results:  [facility] 07/20/12 0520  WBC 4.3  HGB 8.8*  HCT 28.2*  PLT 152   BMET:  Basename 07/21/12 0518 07/20/12 0520  NA 141 139  K 4.5 4.1  CL 107 108  CO2 25 24  GLUCOSE 99 88  BUN 31* 31*  CREATININE 4.92* 5.02*  CALCIUM 7.9* 7.9*   No results found for this basename: PTH:2 in the last 72 hours Iron Studies: No results found for this basename: IRON,TIBC,TRANSFERRIN,FERRITIN in the last 72 hours Studies/Results: No results found.  Scheduled:   . citalopram  20 mg Oral Daily  . clindamycin (CLEOCIN) IV  600 mg Intravenous Q6H  . docusate sodium  100 mg Oral BID  . fentaNYL  50 mcg Transdermal Q72H  . gabapentin  100 mg Oral TID  . insulin aspart  0-5 Units Subcutaneous QHS  . insulin aspart  0-9 Units Subcutaneous TID WC  . pantoprazole  40 mg Oral Daily  . sodium chloride  3 mL Intravenous Q12H  . [COMPLETED] warfarin  3 mg Oral ONCE-1800  . Warfarin - Pharmacist Dosing Inpatient   Does not  apply q1800  . [DISCONTINUED] furosemide  160 mg Oral BID  . [DISCONTINUED] sodium chloride  3 mL Intravenous Q12H   Jona Zappone C 07/21/2012,9:07 AM

## 2012-07-21 NOTE — Progress Notes (Signed)
[facility] PROGRESS NOTE  WADE SIGALA ZOX:[id number] DOB: Oct 20, 1940 DOA: 07/15/2012 PCP: Louie Boston, MD  Assessment/Plan: 1. ARF - developed during admission at [facility] while patient was taking Vancomycin and Zosyn. Patient was admitted with hypotension at [facility]. Patient with known normal creatinine on 08/20/2011. Patient may have ATN vs AIN. urine sediment bland, no significant proteinuria, urine for eos pending . C3,C4,Hep B,C, ANA, ANCA all non diagnostic (negative or WNL)  Urine output 3360 cc/24 hrs . Patient in the polyuric phase starting 07/19/12 Creatinine plateau.  Dr. Lowell Guitar started iv fluids on 07/21/12  2. HTN 3. Mild LLE Cellulitis - non toxic, improving, received iv Vanc and Zosyn at [facility] until 12/3 - switched to iv clinda [facility].  4. DM - SSI 5. 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 Placed TEDs 6. Chronic back pain with hx of multiple surgeries and chronic paraparesis - continue fentanyl patch at reduced dose   7. Hx of gastric bypass surgery  8. Volume overload - resumed lasix 07/17/12 - stopped Lasix 07/20/12 once patient started being in the polyuric phase  9. Anemia due to acute illness and chronic ds. 10. New diagnosis of hypothyroidism - patient started on po Synthroid 07/21/12    Code Status: full Family Communication: husband, son, granddaughter in room  Disposition Plan: home   Consultants:  Renal   Procedures:  none  Antibiotics: Clindamycin 12/5  HPI/Subjective: Feels better today   Objective: Filed Vitals:   07/20/12 2037 07/21/12 0457 07/21/12 0951 07/21/12 1337  BP: 129/78 125/50 129/51 146/68  Pulse: 80 74 81 86  Temp: 98.9 F (37.2 C) 97.7 F (36.5 C) 97.9 F (36.6 C) 97.9 F (36.6 C)  TempSrc: Oral Oral    Resp: 18 18 17 18   Height:      Weight: 811.914 kg (294 lb 3.2 oz)     SpO2: 97% 98% 97% 96%    Intake/Output Summary (Last  24 hours) at 07/21/12 1421 Last data filed at 07/21/12 0951  Gross per 24 hour  Intake    390 ml  Output   3750 ml  Net  -3360 ml   Filed Weights   07/18/12 2126 07/19/12 2047 07/20/12 2037  Weight: 138.256 kg (304 lb 12.8 oz) 138.347 kg (305 lb) 133.448 kg (294 lb 3.2 oz)    Exam:   General:  axox3  Cardiovascular: rrr, nl S1, S2   Respiratory: ctab, no w,r,c   Abdomen: soft, nt  LE bilat edema +1, patient with bilat ace wraps ,  LLE with minimal erythema   Data Reviewed: Basic Metabolic Panel:  Lab 07/21/12 7829 07/20/12 0520 07/19/12 0510 07/18/12 0605 07/17/12 0931 07/16/12 0658  NA 141 139 139 136 137 --  K 4.5 4.1 4.2 4.8 5.0 --  CL 107 108 108 108 108 --  CO2 25 24 21 20 21  --  GLUCOSE 99 88 91 87 147* --  BUN 31* 31* 31* 29* 27* --  CREATININE 4.92* 5.02* 5.16* 5.17* 4.78* --  CALCIUM 7.9* 7.9* 7.9* 7.8* 8.1* --  MG -- -- -- -- -- 1.9  PHOS 5.7* 5.3* 5.2* 5.0* -- 4.3   Liver Function Tests:  Lab 07/21/12 0518 07/20/12 0520 07/19/12 0510 07/18/12 0605 07/16/12 0658  AST -- -- -- -- 22  ALT -- -- -- -- <5  ALKPHOS -- -- -- -- 97  BILITOT -- -- -- -- 0.2*  PROT -- -- -- -- 5.5*  ALBUMIN 2.1* 2.0* 2.1* 2.0* 2.0*   No results found for this basename: LIPASE:5,AMYLASE:5 in the last 168 hours No results found for this basename: AMMONIA:5 in the last 168 hours CBC:  Lab 07/20/12 0520 07/18/12 0605 07/16/12 0658  WBC 4.3 6.2 4.9  NEUTROABS -- 3.5 --  HGB 8.8* 8.6* 9.7*  HCT 28.2* 27.7* 31.2*  MCV 82.7 84.7 85.5  PLT 152 162 188   Cardiac Enzymes: No results found for this basename: CKTOTAL:5,CKMB:5,CKMBINDEX:5,TROPONINI:5 in the last 168 hours BNP (last 3 results) No results found for this basename: PROBNP:3 in the last 8760 hours CBG:  Lab 07/21/12 1133 07/21/12 0733 07/20/12 2113 07/20/12 1647 07/20/12 1134  GLUCAP 100* 84 88 89 89    No results found for this or any previous visit (from the past 240 hour(s)).   Studies: No results  found. Results for BRENEE, GAJDA (MRN [id number]) as of 07/17/2012 07:56  Ref. Range 07/16/2012 15:55  Color, Urine Latest Range: YELLOW  YELLOW  APPearance Latest Range: CLEAR  CLOUDY (A)  Specific Gravity, Urine Latest Range: 1.005-1.030  1.010  pH Latest Range: 5.0-8.0  5.0  Glucose Latest Range: NEGATIVE mg/dL NEGATIVE  Bilirubin Urine Latest Range: NEGATIVE  SMALL (A)  Ketones, ur Latest Range: NEGATIVE mg/dL NEGATIVE  Protein Latest Range: NEGATIVE mg/dL NEGATIVE  Urobilinogen, UA Latest Range: 0.0-1.0 mg/dL 0.2  Nitrite Latest Range: NEGATIVE  NEGATIVE  Leukocytes, UA Latest Range: NEGATIVE  MODERATE (A)  Hgb urine dipstick Latest Range: NEGATIVE  SMALL (A)  Urine-Other No range found MANY YEAST  WBC, UA Latest Range: <3 WBC/hpf 11-20  RBC / HPF Latest Range: <3 RBC/hpf 3-6  Squamous Epithelial / LPF Latest Range: RARE  RARE  Bacteria, UA Latest Range: RARE  RARE   Scheduled Meds:    . citalopram  20 mg Oral Daily  . clindamycin (CLEOCIN) IV  600 mg Intravenous Q6H  . docusate sodium  100 mg Oral BID  . fentaNYL  50 mcg Transdermal Q72H  . gabapentin  100 mg Oral TID  . insulin aspart  0-5 Units Subcutaneous QHS  . insulin aspart  0-9 Units Subcutaneous TID WC  . pantoprazole  40 mg Oral Daily  . sodium chloride  3 mL Intravenous Q12H  . [COMPLETED] warfarin  3 mg Oral ONCE-1800  . warfarin  3 mg Oral ONCE-1800  . Warfarin - Pharmacist Dosing Inpatient   Does not apply q1800  . [DISCONTINUED] sodium chloride  3 mL Intravenous Q12H   Continuous Infusions:    . sodium chloride 1,000 mL (07/21/12 1115)    Active Problems:  DM  Essential hypertension, benign  Cellulitis  Acute renal failure  History of DVT of lower extremity  History of pulmonary embolism       Prisilla Kocsis  [facility] Pager [phone]. If 8PM-8AM, please contact night-coverage at [email or web address], password [facility] 07/21/2012, 2:21 PM  LOS: 6 days

## 2012-07-22 DIAGNOSIS — I10 Essential (primary) hypertension: Secondary | ICD-10-CM

## 2012-07-22 LAB — GLUCOSE, CAPILLARY
Glucose-Capillary: 94 mg/dL (ref 70–99)
Glucose-Capillary: 96 mg/dL (ref 70–99)
Glucose-Capillary: 91 mg/dL (ref 70–99)

## 2012-07-22 LAB — RENAL FUNCTION PANEL
BUN: 29 mg/dL — ABNORMAL HIGH (ref 6–23)
CO2: 23 mEq/L (ref 19–32)
Calcium: 7.8 mg/dL — ABNORMAL LOW (ref 8.4–10.5)
GFR calc Af Amer: 11 mL/min — ABNORMAL LOW (ref >90)
Glucose, Bld: 94 mg/dL (ref 70–99)
Sodium: 139 mEq/L (ref 135–145)

## 2012-07-22 NOTE — Progress Notes (Signed)
[facility] PROGRESS NOTE  [name] [name]:[id number] DOB: 1941-06-11 DOA: 07/15/2012 PCP: Louie Boston, MD  Assessment/Plan: 1. ARF - developed during admission at [facility] while patient was taking Vancomycin and Zosyn. Patient was admitted with hypotension at [facility]. Patient with known normal creatinine on 08/20/2011.  - Nephrology on board and making further recommendations.  Creatinine improving and has gone from 4.92 to 4.32.  Will continue to monitor and f/u with nephrology's recommendations.  2. HTN: Last recorded blood pressure 147/71.  At this juncture would favor monitoring and adjusting medication pending further blood pressure readings. 3. Mild LLE Cellulitis - non toxic, improving, received iv Vanc and Zosyn at [facility] until 12/3 - switched to iv clinda upon transfer to [facility] on 12/4.  4. DM - SSI 5. 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 Placed TEDs 6. Chronic back pain with hx of multiple surgeries and chronic paraparesis - continue fentanyl patch at reduced dose   7. Hx of gastric bypass surgery  8. Volume overload - resumed lasix 07/17/12 - stopped Lasix 07/20/12 once patient started being in the polyuric phase  9. Anemia due to acute illness and chronic ds. 10. New diagnosis of hypothyroidism - patient started on po Synthroid 07/21/12    Code Status: full Family Communication: No family at bedside. Disposition Plan: home   Consultants:  Renal   Procedures:  none  Antibiotics: Clindamycin 12/5  HPI/Subjective: Feels better today. No new complaints reported.  Objective: Filed Vitals:   07/21/12 1807 07/21/12 2110 07/22/12 0531 07/22/12 0900  BP: 142/54 144/61 177/57 147/71  Pulse: 88 85 79 73  Temp: 97.8 F (36.6 C) 98 F (36.7 C) 97.8 F (36.6 C) 97.8 F (36.6 C)  TempSrc:  Oral Oral   Resp: 20 18 18 18   Height:      Weight:  133.494 kg (294 lb 4.8 oz)    SpO2: 98% 99% 98% 98%     Intake/Output Summary (Last 24 hours) at 07/22/12 1149 Last data filed at 07/22/12 0905  Gross per 24 hour  Intake 3582.5 ml  Output   1650 ml  Net 1932.5 ml   Filed Weights   07/19/12 2047 07/20/12 2037 07/21/12 2110  Weight: 138.347 kg (305 lb) 133.448 kg (294 lb 3.2 oz) 133.494 kg (294 lb 4.8 oz)    Exam:   General:  Alert and Awake. Smiling in bed  Cardiovascular: rrr, nl S1, S2   Respiratory: ctab, no w,r,c   Abdomen: soft, nt, obese LE bilat edema +1, patient with bilat ace wraps ,  LLE with minimal erythema   Data Reviewed: Basic Metabolic Panel:  Lab 07/22/12 8119 07/21/12 0518 07/20/12 0520 07/19/12 0510 07/18/12 0605 07/16/12 0658  NA 139 141 139 139 136 --  K 3.9 4.5 4.1 4.2 4.8 --  CL 106 107 108 108 108 --  CO2 23 25 24 21 20  --  GLUCOSE 94 99 88 91 87 --  BUN 29* 31* 31* 31* 29* --  CREATININE 4.32* 4.92* 5.02* 5.16* 5.17* --  CALCIUM 7.8* 7.9* 7.9* 7.9* 7.8* --  MG -- -- -- -- -- 1.9  PHOS 5.2* 5.7* 5.3* 5.2* 5.0* --   Liver Function Tests:  Lab 07/22/12 0615 07/21/12 0518 07/20/12 0520 07/19/12 0510 07/18/12 0605 07/16/12 0658  AST -- -- -- -- -- 22  ALT -- -- -- -- -- <5  ALKPHOS -- -- -- -- -- 97  BILITOT -- -- -- -- --  0.2*  PROT -- -- -- -- -- 5.5*  ALBUMIN 2.2* 2.1* 2.0* 2.1* 2.0* --   No results found for this basename: LIPASE:5,AMYLASE:5 in the last 168 hours No results found for this basename: AMMONIA:5 in the last 168 hours CBC:  Lab 07/20/12 0520 07/18/12 0605 07/16/12 0658  WBC 4.3 6.2 4.9  NEUTROABS -- 3.5 --  HGB 8.8* 8.6* 9.7*  HCT 28.2* 27.7* 31.2*  MCV 82.7 84.7 85.5  PLT 152 162 188   Cardiac Enzymes: No results found for this basename: CKTOTAL:5,CKMB:5,CKMBINDEX:5,TROPONINI:5 in the last 168 hours BNP (last 3 results) No results found for this basename: PROBNP:3 in the last 8760 hours CBG:  Lab 07/22/12 0743 07/21/12 2114 07/21/12 1804 07/21/12 1133 07/21/12 0733  GLUCAP 94 94 121* 100* 84    No results  found for this or any previous visit (from the past 240 hour(s)).   Studies: No results found. Results for PATTI, SHORB (MRN [id number]) as of 07/17/2012 07:56  Ref. Range 07/16/2012 15:55  Color, Urine Latest Range: YELLOW  YELLOW  APPearance Latest Range: CLEAR  CLOUDY (A)  Specific Gravity, Urine Latest Range: 1.005-1.030  1.010  pH Latest Range: 5.0-8.0  5.0  Glucose Latest Range: NEGATIVE mg/dL NEGATIVE  Bilirubin Urine Latest Range: NEGATIVE  SMALL (A)  Ketones, ur Latest Range: NEGATIVE mg/dL NEGATIVE  Protein Latest Range: NEGATIVE mg/dL NEGATIVE  Urobilinogen, UA Latest Range: 0.0-1.0 mg/dL 0.2  Nitrite Latest Range: NEGATIVE  NEGATIVE  Leukocytes, UA Latest Range: NEGATIVE  MODERATE (A)  Hgb urine dipstick Latest Range: NEGATIVE  SMALL (A)  Urine-Other No range found MANY YEAST  WBC, UA Latest Range: <3 WBC/hpf 11-20  RBC / HPF Latest Range: <3 RBC/hpf 3-6  Squamous Epithelial / LPF Latest Range: RARE  RARE  Bacteria, UA Latest Range: RARE  RARE   Scheduled Meds:    . citalopram  20 mg Oral Daily  . clindamycin (CLEOCIN) IV  600 mg Intravenous Q6H  . docusate sodium  100 mg Oral BID  . fentaNYL  50 mcg Transdermal Q72H  . gabapentin  100 mg Oral TID  . insulin aspart  0-5 Units Subcutaneous QHS  . insulin aspart  0-9 Units Subcutaneous TID WC  . levothyroxine  25 mcg Oral QAC breakfast  . pantoprazole  40 mg Oral Daily  . sodium chloride  3 mL Intravenous Q12H  . [COMPLETED] warfarin  3 mg Oral ONCE-1800  . Warfarin - Pharmacist Dosing Inpatient   Does not apply q1800   Continuous Infusions:    . sodium chloride 150 mL (07/22/12 0858)    Active Problems:  DM  Essential hypertension, benign  Cellulitis  Acute renal failure  History of DVT of lower extremity  History of pulmonary embolism       [name] [name]  [facility] Pager [phone]. If 8PM-8AM, please contact night-coverage at [email or web address], password [facility] 07/22/2012, 11:49 AM  LOS: 7  days

## 2012-07-22 NOTE — Progress Notes (Signed)
ANTICOAGULATION CONSULT NOTE - Follow Up Consult  Pharmacy Consult for Warfarin Indication: Hx PE/DVT s/p IVC filter placement  Allergies  Allergen Reactions  . Bee Venom   . Codeine Palpitations    Per patient, "Feels like I'm having a heart attack."    Patient Measurements: Height: 5\' 7"  (170.2 cm) Weight: 294 lb 4.8 oz (133.494 kg) IBW/kg (Calculated) : 61.6   Vital Signs: Temp: 97.8 F (36.6 C) (12/11 0900) Temp src: Oral (12/11 0531) BP: 147/71 mmHg (12/11 0900) Pulse Rate: 73  (12/11 0900)  Labs:  Basename 07/22/12 0615 07/21/12 0518 07/20/12 0520  HGB -- -- 8.8*  HCT -- -- 28.2*  PLT -- -- 152  APTT -- -- --  LABPROT 28.6* 24.2* 30.3*  INR 2.87* 2.29* 3.10*  HEPARINUNFRC -- -- --  CREATININE 4.32* 4.92* 5.02*  CKTOTAL -- -- --  CKMB -- -- --  TROPONINI -- -- --    Estimated Creatinine Clearance: 17.3 ml/min (by C-G formula based on Cr of 4.32).  Assessment:  71 y.o. F transferred from [facility] with acute renal failure and cellulitis who is on chronic warfarin for h/o PE and DVT s/p IVC filter, to continue warfarin per pharmacy. INR today remains therapeutic however trending up quickly (INR 2.87 << 2.29 << 3.1, goal of 2-3). No CBC today, no s/sx of bleeding noted at this time. The patient was re-educated on warfarin this admission. Will hold warfarin today given quick rise in INR -- will f/u resuming doses on 12/12.   Previously, the patient was known to be taking 3 mg daily EXCEPT for 4 mg on Tues/Thurs. Given trends this admission, the patient will need a lower dose upon discharge.   Goal of Therapy:  INR 2-3   Plan: 1. Hold warfarin dose today 2. Will continue to monitor for any signs/symptoms of bleeding and will follow up with PT/INR in the a.m.  Georgina Pillion, PharmD, BCPS Clinical Pharmacist Pager: [phone] 07/22/2012 9:14 AM

## 2012-07-22 NOTE — Progress Notes (Signed)
Assessment/Rec:  1. Acute renal failure, Nonoliguric Recovery phase. Will reduce IVF support to 100cc/hr 2. Lt leg cellulitis 3. DM 4. Hx CAD/MI,  5. Morbid obesity 6. Hx of PE  Subjective: Interval History:  Feeling OK Objective: Vital signs in last 24 hours: Temp:  [97.8 F (36.6 C)-98 F (36.7 C)] 97.8 F (36.6 C) (12/11 0900) Pulse Rate:  [73-88] 73  (12/11 0900) Resp:  [18-20] 18  (12/11 0900) BP: (142-177)/(54-71) 147/71 mmHg (12/11 0900) SpO2:  [96 %-99 %] 98 % (12/11 0900) Weight:  [133.494 kg (294 lb 4.8 oz)] 133.494 kg (294 lb 4.8 oz) (12/10 2110) Weight change: 0.045 kg (1.6 oz)  Intake/Output from previous day: 12/10 0701 - 12/11 0700 In: 3632.5 [P.O.:920; I.V.:2662.5; IV Piggyback:50] Out: 2450 [Urine:2450] Intake/Output this shift: Total I/O In: 240 [P.O.:240] Out: -   General appearance: alert and cooperative Head: Normocephalic, without obvious abnormality, atraumatic Resp: clear to auscultation bilaterally Chest wall: no tenderness Cardio: regular rate and rhythm, S1, S2 normal, no murmur, click, rub or gallop GI: soft, non-tender; bowel sounds normal; no masses,  no organomegaly Extremities: legs wrapped bilaterally  Lab Results:  Basename 07/20/12 0520  WBC 4.3  HGB 8.8*  HCT 28.2*  PLT 152   BMET:  Basename 07/22/12 0615 07/21/12 0518  NA 139 141  K 3.9 4.5  CL 106 107  CO2 23 25  GLUCOSE 94 99  BUN 29* 31*  CREATININE 4.32* 4.92*  CALCIUM 7.8* 7.9*   No results found for this basename: PTH:2 in the last 72 hours Iron Studies: No results found for this basename: IRON,TIBC,TRANSFERRIN,FERRITIN in the last 72 hours Studies/Results: No results found.  Scheduled:   . citalopram  20 mg Oral Daily  . clindamycin (CLEOCIN) IV  600 mg Intravenous Q6H  . docusate sodium  100 mg Oral BID  . fentaNYL  50 mcg Transdermal Q72H  . gabapentin  100 mg Oral TID  . insulin aspart  0-5 Units Subcutaneous QHS  . insulin aspart  0-9 Units  Subcutaneous TID WC  . levothyroxine  25 mcg Oral QAC breakfast  . pantoprazole  40 mg Oral Daily  . sodium chloride  3 mL Intravenous Q12H  . [COMPLETED] warfarin  3 mg Oral ONCE-1800  . Warfarin - Pharmacist Dosing Inpatient   Does not apply q1800     LOS: 7 days   Malin Sambrano C 07/22/2012,11:01 AM

## 2012-07-23 DIAGNOSIS — I10 Essential (primary) hypertension: Secondary | ICD-10-CM

## 2012-07-23 LAB — RENAL FUNCTION PANEL
BUN: 26 mg/dL — ABNORMAL HIGH (ref 6–23)
CO2: 26 mEq/L (ref 19–32)
GFR calc Af Amer: 12 mL/min — ABNORMAL LOW (ref >90)
Glucose, Bld: 103 mg/dL — ABNORMAL HIGH (ref 70–99)
Potassium: 4.1 mEq/L (ref 3.5–5.1)
Sodium: 141 mEq/L (ref 135–145)

## 2012-07-23 LAB — GLUCOSE, CAPILLARY
Glucose-Capillary: 97 mg/dL (ref 70–99)
Glucose-Capillary: 110 mg/dL — ABNORMAL HIGH (ref 70–99)

## 2012-07-23 MED ORDER — WARFARIN SODIUM 1 MG PO TABS
1.0000 mg | ORAL_TABLET | Freq: Once | ORAL | Status: AC
  Administered 2012-07-23: 1 mg via ORAL
  Filled 2012-07-23: qty 1

## 2012-07-23 NOTE — Progress Notes (Signed)
ANTICOAGULATION CONSULT NOTE - Follow Up Consult  Pharmacy Consult for Warfarin Indication: Hx PE/DVT s/p IVC filter placement  Allergies  Allergen Reactions  . Bee Venom   . Codeine Palpitations    Per patient, "Feels like I'm having a heart attack."    Patient Measurements: Height: 5\' 7"  (170.2 cm) Weight: 294 lb 4.8 oz (133.494 kg) IBW/kg (Calculated) : 61.6   Vital Signs: Temp: 98.5 F (36.9 C) (12/12 0539) Temp src: Oral (12/12 0539) BP: 134/68 mmHg (12/12 0539) Pulse Rate: 73  (12/12 0539)  Labs:  Basename 07/23/12 0615 07/22/12 0615 07/21/12 0518  HGB -- -- --  HCT -- -- --  PLT -- -- --  APTT -- -- --  LABPROT 29.8* 28.6* 24.2*  INR 3.03* 2.87* 2.29*  HEPARINUNFRC -- -- --  CREATININE 4.07* 4.32* 4.92*  CKTOTAL -- -- --  CKMB -- -- --  TROPONINI -- -- --    Estimated Creatinine Clearance: 18.4 ml/min (by C-G formula based on Cr of 4.07).  Assessment:  71 y.o. 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 INR is slightly elevated today at 3.03 after held dose last night. No CBC today, no s/sx of bleeding noted at this time. The patient was re-educated on warfarin this admission.   Previously, the patient was known to be taking 3 mg daily EXCEPT for 4 mg on Tues/Thurs. Given trends this admission, the patient will need a lower dose upon discharge.   Goal of Therapy:  INR 2-3   Plan: 1. Will give low dose coumadin tonight - coumadin 1mg  PO x 1 tonight 2. Will continue to monitor for any signs/symptoms of bleeding and will follow up with PT/INR in the a.m.  Lysle Pearl, PharmD, BCPS Pager # [phone] 07/23/2012 8:42 AM

## 2012-07-23 NOTE — Progress Notes (Signed)
Assessment/Rec:  1. Acute renal failure, Nonoliguric Recovery phase. Will stop IVFs. D/C foley 2. Lt leg cellulitis 3. DM 4. Hx CAD/MI,  5. Morbid obesity 6. Hx of PE  Subjective: Interval History:Felt better yesterday, nonspecific  Objective: Vital signs in last 24 hours: Temp:  [98.4 F (36.9 C)-98.7 F (37.1 C)] 98.5 F (36.9 C) (12/12 0805) Pulse Rate:  [73-88] 79  (12/12 0805) Resp:  [18-20] 20  (12/12 0805) BP: (128-158)/(68-85) 128/71 mmHg (12/12 0805) SpO2:  [95 %-98 %] 96 % (12/12 0805) Weight:  [133.494 kg (294 lb 4.8 oz)] 133.494 kg (294 lb 4.8 oz) (12/11 2023) Weight change: 0 kg (0 lb)  Intake/Output from previous day: 12/11 0701 - 12/12 0700 In: 3758.3 [P.O.:1500; I.V.:2258.3] Out: 2627 [Urine:2625; Stool:2] Intake/Output this shift: Total I/O In: 240 [P.O.:240] Out: -   General appearance: alert and cooperative Resp: clear to auscultation bilaterally Chest wall: no tenderness Cardio: regular rate and rhythm, S1, S2 normal, no murmur, click, rub or gallop Extremities: edema 3+ left >r  Lab Results: No results found for this basename: WBC:2,HGB:2,HCT:2,PLT:2 in the last 72 hours BMET:  [facility] 07/23/12 0615 07/22/12 0615  NA 141 139  K 4.1 3.9  CL 108 106  CO2 26 23  GLUCOSE 103* 94  BUN 26* 29*  CREATININE 4.07* 4.32*  CALCIUM 8.1* 7.8*   No results found for this basename: PTH:2 in the last 72 hours Iron Studies: No results found for this basename: IRON,TIBC,TRANSFERRIN,FERRITIN in the last 72 hours Studies/Results: No results found.  Scheduled:   . citalopram  20 mg Oral Daily  . clindamycin (CLEOCIN) IV  600 mg Intravenous Q6H  . docusate sodium  100 mg Oral BID  . fentaNYL  50 mcg Transdermal Q72H  . gabapentin  100 mg Oral TID  . insulin aspart  0-5 Units Subcutaneous QHS  . insulin aspart  0-9 Units Subcutaneous TID WC  . levothyroxine  25 mcg Oral QAC breakfast  . pantoprazole  40 mg Oral Daily  . sodium chloride  3 mL  Intravenous Q12H  . warfarin  1 mg Oral ONCE-1800  . Warfarin - Pharmacist Dosing Inpatient   Does not apply q1800      LOS: 8 days   Kipton Skillen C 07/23/2012,10:59 AM

## 2012-07-23 NOTE — Evaluation (Signed)
Physical Therapy Evaluation Patient Details Name: [name] [name] MRN: [id number] DOB: 1941-08-03 Today's Date: 07/23/2012 Time: 4098-1191 PT Time Calculation (min): 51 min  PT Assessment / Plan / Recommendation Clinical Impression  71 yo adm with acute renal failure from [facility]. 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 Pt near baseline for mobility and is able to d/c home with family's assist whenever MD deems medically appropriate. Will follow for continued PT needs    PT Assessment  Patient needs continued PT services    Follow Up Recommendations  Home health PT;Supervision for mobility/OOB        Barriers to Discharge None      Equipment Recommendations  None recommended by PT    Recommendations for Other Services     Frequency Min 3X/week    Precautions / Restrictions Precautions Precautions: Fall   Pertinent Vitals/Pain Chronic back pain--# not rated      Mobility  Bed Mobility Bed Mobility: Rolling Right;Rolling Left;Left Sidelying to Sit;Sitting - Scoot to Edge of Bed Rolling Right: 4: Min assist;With rail Rolling Left: 4: Min assist;With rail Left Sidelying to Sit: 4: Min assist;With rails;HOB elevated Sitting - Scoot to Edge of Bed: 5: Supervision;With rail Details for Bed Mobility Assistance: Pt reports she has never been able to roll to her Rt independently (since SCI) because she cannot flex Lt hip and bring LLE across to help roll lower body; typically can roll to her Lt side modified I (with use of hospital bed/rails at home) Transfers Transfers: Sit to Stand;Stand to Sit;Stand Pivot Transfers Sit to Stand: 4: Min assist;With upper extremity assist;With armrests;From bed;From chair/3-in-1 Stand to Sit: 4: Min guard;With upper extremity assist;With armrests;To chair/3-in-1 Stand Pivot Transfers: 4: Min guard Details for Transfer Assistance: stood x 4 (from bed vs chair) with no  physical assist needed from bed; min assist pt90% from chair Ambulation/Gait Ambulation/Gait Assistance: 4: Min guard Ambulation Distance (Feet): 3 Feet Assistive device: Rolling walker Ambulation/Gait Assistance Details: slowly advances bil feet with shuffling gait Gait Pattern: Step-to pattern;Decreased hip/knee flexion - right;Decreased hip/knee flexion - left;Decreased dorsiflexion - right;Decreased dorsiflexion - left;Right foot flat;Left foot flat;Shuffle              PT Diagnosis: Difficulty walking;Other (comment) (paraplegia)  PT Problem List: Decreased strength;Decreased activity tolerance;Decreased mobility;Impaired sensation PT Treatment Interventions: DME instruction;Gait training;Functional mobility training;Therapeutic activities;Therapeutic exercise;Patient/family education   PT Goals Acute Rehab PT Goals PT Goal Formulation: With patient/family Time For Goal Achievement: 07/30/12 Potential to Achieve Goals: Good Pt will Roll Supine to Right Side: Other (comment);with min assist (vc and assist to lead with Left knee) PT Goal: Rolling Supine to Right Side - Progress: Goal set today Pt will Roll Supine to Left Side: with modified independence PT Goal: Rolling Supine to Left Side - Progress: Goal set today Pt will go Supine/Side to Sit: with modified independence;with HOB not 0 degrees (comment degree);with rail PT Goal: Supine/Side to Sit - Progress: Goal set today Pt will go Sit to Supine/Side: with min assist;with rail (min assist for legs) PT Goal: Sit to Supine/Side - Progress: Goal set today Pt will go Sit to Stand: with modified independence PT Goal: Sit to Stand - Progress: Goal set today Pt will go Stand to Sit: with modified independence;with upper extremity assist PT Goal: Stand to Sit - Progress: Goal set today Pt will Ambulate: 16 - 50 feet;with +2 total assist;with rolling walker (pt=90%) PT Goal: Ambulate -  Progress: Goal set today Pt will Perform Home  Exercise Program: with supervision, verbal cues required/provided PT Goal: Perform Home Exercise Program - Progress: Goal set today  Visit Information  Last PT Received On: 07/23/12 Assistance Needed: +2 (for amb)    Subjective Data  Subjective: Reports she has been up to chair with nursing; has been using bedpan due to cannot fit on BSC in her room Patient Stated Goal: regain strength and ability to ambulate up to 30 ft   Prior Functioning  Home Living Lives With: Spouse Available Help at Discharge: Family;Personal care attendant;Available 24 hours/day Type of Home: House Home Access: Ramped entrance Home Layout: Able to live on main level with bedroom/bathroom Home Adaptive Equipment: Bedside commode/3-in-1;Hospital bed;Walker - rolling;Wheelchair - Careers adviser (comment) (sliding board) Prior Function Level of Independence: Needs assistance Needs Assistance: Gait;Transfers Gait Assistance: walks up to 30 ft with RW with w/c to follow Transfer Assistance: car transfer with sliding board; typically can do stand-pivot with RW I'ly Able to Take Stairs?: No Vocation: Retired Musician: No difficulties    Cognition  Overall Cognitive Status: Appears within functional limits for tasks assessed/performed Arousal/Alertness: Awake/alert Orientation Level: Oriented X4 / Intact Behavior During Session: WFL for tasks performed    Extremity/Trunk Assessment Right Upper Extremity Assessment RUE ROM/Strength/Tone: WFL for tasks assessed RUE Sensation: History of peripheral neuropathy RUE Coordination: WFL - gross motor Left Upper Extremity Assessment LUE ROM/Strength/Tone: WFL for tasks assessed LUE Sensation: History of peripheral neuropathy LUE Coordination: WFL - gross motor Right Lower Extremity Assessment RLE ROM/Strength/Tone: Deficits RLE ROM/Strength/Tone Deficits: hip flexion 2+, knee extension 3+/5, ankle DF 2+ RLE Sensation: Deficits RLE Sensation  Deficits: numbness s/p spinal cord hematoma/?infarct Left Lower Extremity Assessment LLE ROM/Strength/Tone: Deficits LLE ROM/Strength/Tone Deficits: hip flexion 2-/5, knee extension 3/5, ankle DF 2+/5 LLE Sensation: Deficits LLE Sensation Deficits: numbness s/p spinal cord hematoma/?infarct   Balance Balance Balance Assessed: Yes Static Sitting Balance Static Sitting - Balance Support: No upper extremity supported;Feet supported Static Sitting - Level of Assistance: 7: Independent Static Standing Balance Static Standing - Balance Support: Bilateral upper extremity supported Static Standing - Level of Assistance: 5: Stand by assistance Static Standing - Comment/# of Minutes: stood up to 2 minutes at a time (pericare after incontinent of bowels and after using bedpan (sitting up in chair)  End of Session PT - End of Session Activity Tolerance: Patient tolerated treatment well Patient left: in chair;with call bell/phone within reach;with family/visitor present Nurse Communication: Mobility status;Other (comment) (need for wide BSC)  GP     Roman Dubuc 07/23/2012, 3:53 PM  Pager [phone]

## 2012-07-23 NOTE — Progress Notes (Signed)
[facility] PROGRESS NOTE  [name] [name] ZOX:[id number] DOB: Oct 13, 1940 DOA: 07/15/2012 PCP: Louie Boston, MD  Assessment/Plan: 1. ARF - developed during admission at [facility] while patient was taking Vancomycin and Zosyn. Patient was admitted with hypotension at [facility]. Patient with known normal creatinine on 08/20/2011.  - Nephrology on board and making further recommendations.  Creatinine improving and has gone from 4.32 to 4.07.  Will continue to monitor and f/u with nephrology's recommendations. 2. HTN: Last recorded blood pressure 135/66.  At this juncture will continue to monitor. 3. Mild LLE Cellulitis - non toxic, improving, received iv Vanc and Zosyn at [facility] until 12/3 - switched to iv clinda upon transfer to [facility] on 12/4.  4. DM - SSI 5. 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 Placed TEDs 6. Chronic back pain with hx of multiple surgeries and chronic paraparesis - continue fentanyl patch at reduced dose   7. Hx of gastric bypass surgery  8. Volume overload - resumed lasix 07/17/12 - stopped Lasix 07/20/12 once patient started being in the polyuric phase  9. Anemia due to acute illness and chronic ds. 10. New diagnosis of hypothyroidism - patient started on po Synthroid 07/21/12    Code Status: full Family Communication: No family at bedside. Disposition Plan: home   Consultants:  Renal   Procedures:  none  Antibiotics: Clindamycin 12/5  HPI/Subjective: No new complaints reported. No acute issues overnight  Objective: Filed Vitals:   07/22/12 2023 07/23/12 0539 07/23/12 0805 07/23/12 1440  BP: 158/70 134/68 128/71 135/66  Pulse: 85 73 79 83  Temp: 98.6 F (37 C) 98.5 F (36.9 C) 98.5 F (36.9 C) 98.4 F (36.9 C)  TempSrc: Oral Oral Oral Oral  Resp: 20 20 20 20   Height:      Weight: 133.494 kg (294 lb 4.8 oz)     SpO2: 95% 95% 96% 97%    Intake/Output Summary (Last 24 hours) at  07/23/12 1511 Last data filed at 07/23/12 1417  Gross per 24 hour  Intake   2240 ml  Output   2901 ml  Net   -661 ml   Filed Weights   07/20/12 2037 07/21/12 2110 07/22/12 2023  Weight: 133.448 kg (294 lb 3.2 oz) 133.494 kg (294 lb 4.8 oz) 133.494 kg (294 lb 4.8 oz)    Exam:   General:  Alert and Awake. Smiling in bed  Cardiovascular: rrr, nl S1, S2   Respiratory: ctab, no w,r,c   Abdomen: soft, nt, obese LE bilat edema +1, patient with bilat ace wraps ,  LLE with minimal erythema   Data Reviewed: Basic Metabolic Panel:  Lab 07/23/12 8119 07/22/12 0615 07/21/12 0518 07/20/12 0520 07/19/12 0510  NA 141 139 141 139 139  K 4.1 3.9 4.5 4.1 4.2  CL 108 106 107 108 108  CO2 26 23 25 24 21   GLUCOSE 103* 94 99 88 91  BUN 26* 29* 31* 31* 31*  CREATININE 4.07* 4.32* 4.92* 5.02* 5.16*  CALCIUM 8.1* 7.8* 7.9* 7.9* 7.9*  MG -- -- -- -- --  PHOS 5.2* 5.2* 5.7* 5.3* 5.2*   Liver Function Tests:  Lab 07/23/12 0615 07/22/12 0615 07/21/12 0518 07/20/12 0520 07/19/12 0510  AST -- -- -- -- --  ALT -- -- -- -- --  ALKPHOS -- -- -- -- --  BILITOT -- -- -- -- --  PROT -- -- -- -- --  ALBUMIN 2.3* 2.2* 2.1* 2.0*  2.1*   No results found for this basename: LIPASE:5,AMYLASE:5 in the last 168 hours No results found for this basename: AMMONIA:5 in the last 168 hours CBC:  Lab 07/20/12 0520 07/18/12 0605  WBC 4.3 6.2  NEUTROABS -- 3.5  HGB 8.8* 8.6*  HCT 28.2* 27.7*  MCV 82.7 84.7  PLT 152 162   Cardiac Enzymes: No results found for this basename: CKTOTAL:5,CKMB:5,CKMBINDEX:5,TROPONINI:5 in the last 168 hours BNP (last 3 results) No results found for this basename: PROBNP:3 in the last 8760 hours CBG:  Lab 07/23/12 1156 07/23/12 0739 07/22/12 2026 07/22/12 1701 07/22/12 1225  GLUCAP 110* 97 91 96 120*    No results found for this or any previous visit (from the past 240 hour(s)).   Studies: No results found. Results for [name] [name], [name] [name] (MRN [id number]) as of 07/17/2012  07:56  Ref. Range 07/16/2012 15:55  Color, Urine Latest Range: YELLOW  YELLOW  APPearance Latest Range: CLEAR  CLOUDY (A)  Specific Gravity, Urine Latest Range: 1.005-1.030  1.010  pH Latest Range: 5.0-8.0  5.0  Glucose Latest Range: NEGATIVE mg/dL NEGATIVE  Bilirubin Urine Latest Range: NEGATIVE  SMALL (A)  Ketones, ur Latest Range: NEGATIVE mg/dL NEGATIVE  Protein Latest Range: NEGATIVE mg/dL NEGATIVE  Urobilinogen, UA Latest Range: 0.0-1.0 mg/dL 0.2  Nitrite Latest Range: NEGATIVE  NEGATIVE  Leukocytes, UA Latest Range: NEGATIVE  MODERATE (A)  Hgb urine dipstick Latest Range: NEGATIVE  SMALL (A)  Urine-Other No range found MANY YEAST  WBC, UA Latest Range: <3 WBC/hpf 11-20  RBC / HPF Latest Range: <3 RBC/hpf 3-6  Squamous Epithelial / LPF Latest Range: RARE  RARE  Bacteria, UA Latest Range: RARE  RARE   Scheduled Meds:    . citalopram  20 mg Oral Daily  . clindamycin (CLEOCIN) IV  600 mg Intravenous Q6H  . docusate sodium  100 mg Oral BID  . fentaNYL  50 mcg Transdermal Q72H  . gabapentin  100 mg Oral TID  . insulin aspart  0-5 Units Subcutaneous QHS  . insulin aspart  0-9 Units Subcutaneous TID WC  . levothyroxine  25 mcg Oral QAC breakfast  . pantoprazole  40 mg Oral Daily  . sodium chloride  3 mL Intravenous Q12H  . warfarin  1 mg Oral ONCE-1800  . Warfarin - Pharmacist Dosing Inpatient   Does not apply q1800   Continuous Infusions:    . [DISCONTINUED] sodium chloride 1,000 mL (07/23/12 9147)    Active Problems:  DM  Essential hypertension, benign  Cellulitis  Acute renal failure  History of DVT of lower extremity  History of pulmonary embolism       Penny Pia  [facility] Pager [phone]. If 8PM-8AM, please contact night-coverage at [email or web address], password [email or web address] 07/23/2012, 3:11 PM  LOS: 8 days

## 2012-07-24 LAB — RENAL FUNCTION PANEL
Albumin: 2.2 g/dL — ABNORMAL LOW (ref 3.5–5.2)
CO2: 23 mEq/L (ref 19–32)
Chloride: 107 mEq/L (ref 96–112)
Creatinine, Ser: 3.67 mg/dL — ABNORMAL HIGH (ref 0.50–1.10)
GFR calc Af Amer: 13 mL/min — ABNORMAL LOW (ref >90)
GFR calc non Af Amer: 12 mL/min — ABNORMAL LOW (ref >90)
Sodium: 140 mEq/L (ref 135–145)

## 2012-07-24 LAB — PROTIME-INR
INR: 2.67 — ABNORMAL HIGH (ref 0.00–1.49)
Prothrombin Time: 27.1 seconds — ABNORMAL HIGH (ref 11.6–15.2)

## 2012-07-24 LAB — GLUCOSE, CAPILLARY
Glucose-Capillary: 96 mg/dL (ref 70–99)
Glucose-Capillary: 92 mg/dL (ref 70–99)

## 2012-07-24 MED ORDER — WARFARIN SODIUM 1 MG PO TABS
1.0000 mg | ORAL_TABLET | Freq: Once | ORAL | Status: AC
  Administered 2012-07-24: 1 mg via ORAL
  Filled 2012-07-24: qty 1

## 2012-07-24 NOTE — Progress Notes (Signed)
Physical Therapy Treatment Patient Details Name: [name] [name] MRN: [id number] DOB: [birth year] Today's Date: 07/24/2012 Time: 5784-6962 PT Time Calculation (min): 27 min  PT Assessment / Plan / Recommendation Comments on Treatment Session  Pt. with gradual progress with her mobility today    Follow Up Recommendations  Home health PT;Supervision/Assistance - 24 hour;Supervision for mobility/OOB     Does the patient have the potential to tolerate intense rehabilitation     Barriers to Discharge        Equipment Recommendations  None recommended by PT    Recommendations for Other Services    Frequency Min 3X/week   Plan Discharge plan remains appropriate    Precautions / Restrictions Precautions Precautions: Fall Restrictions Weight Bearing Restrictions: No   Pertinent Vitals/Pain No pain, no distress    Mobility  Bed Mobility Bed Mobility: Rolling Left;Left Sidelying to Sit;Sitting - Scoot to [place] of Bed Rolling Left: 4: Min assist;With rail Left Sidelying to Sit: 4: Min assist;With rails;HOB elevated Sitting - Scoot to [place] of Bed: 4: Min assist;Other (comment) (with use of bedpad) Details for Bed Mobility Assistance: Needed manual assist to bring hips to edge of bed, but she managed most of transition otherwise Transfers Transfers: Sit to Stand;Stand to Sit;Stand Pivot Transfers Sit to Stand: 4: Min assist;With upper extremity assist;From bed Stand to Sit: 4: Min assist;To chair/3-in-1;With armrests Stand Pivot Transfers: 4: Min guard Details for Transfer Assistance: able to lift herself up to stand with min assist for safety (second person available for safety) Ambulation/Gait Ambulation/Gait Assistance: 4: Min guard Ambulation Distance (Feet): 3 Feet Assistive device: Rolling walker Ambulation/Gait Assistance Details: shuffling steps to recliner made Gait Pattern: Step-to pattern;Decreased hip/knee flexion - right;Decreased hip/knee flexion - left;Decreased  dorsiflexion - right;Decreased dorsiflexion - left;Right foot flat;Left foot flat;Shuffle    Exercises     PT Diagnosis:    PT Problem List:   PT Treatment Interventions:     PT Goals Acute Rehab PT Goals PT Goal: Rolling Supine to Left Side - Progress: Progressing toward goal PT Goal: Supine/Side to Sit - Progress: Progressing toward goal PT Goal: Sit to Stand - Progress: Progressing toward goal PT Goal: Stand to Sit - Progress: Progressing toward goal PT Goal: Ambulate - Progress: Progressing toward goal  Visit Information  Last PT Received On: 07/24/12 Assistance Needed: +2    Subjective Data  Subjective: "I haven't been out of bed today"   Cognition  Overall Cognitive Status: Appears within functional limits for tasks assessed/performed Arousal/Alertness: Awake/alert Orientation Level: Oriented X4 / Intact Behavior During Session: [facility] for tasks performed    Balance     End of Session PT - End of Session Equipment Utilized During Treatment: Gait belt Activity Tolerance: Patient tolerated treatment well Patient left: in chair;with call bell/phone within reach;with family/visitor present Nurse Communication: Mobility status   GP     Ferman Hamming 07/24/2012, 3:23 PM Weldon Picking PT Acute Rehab Services [phone] Bee[place] [phone]

## 2012-07-24 NOTE — Progress Notes (Signed)
Assessment/Rec:  1. Acute renal failure, Nonoliguric Recovery phase. Continue same Rx 2. Lt leg cellulitis 3. DM 4. Hx CAD/MI,  5. Morbid obesity 6. Hx of PE  Subjective: Interval History: Foley out and using BSC  Objective: Vital signs in last 24 hours: Temp:  [97.9 F (36.6 C)-98.7 F (37.1 C)] 97.9 F (36.6 C) (12/13 0945) Pulse Rate:  [75-89] 89  (12/13 0945) Resp:  [18-20] 18  (12/13 0945) BP: (135-159)/(51-71) 135/51 mmHg (12/13 0945) SpO2:  [92 %-98 %] 97 % (12/13 0945) Weight:  [134.446 kg (296 lb 6.4 oz)] 134.446 kg (296 lb 6.4 oz) (12/12 2201) Weight change: 0.953 kg (2 lb 1.6 oz)  Intake/Output from previous day: 12/12 0701 - 12/13 0700 In: 700 [P.O.:600] Out: 1451 [Urine:1450; Stool:1] Intake/Output this shift:   General appearance: alert and cooperative Resp: clear to auscultation bilaterally Chest wall: no tenderness GI: soft, non-tender; bowel sounds normal; no masses,  no organomegaly Extremities: edema 2+  Lab Results: No results found for this basename: WBC:2,HGB:2,HCT:2,PLT:2 in the last 72 hours BMET:  [facility] 07/24/12 0610 07/23/12 0615  NA 140 141  K 3.7 4.1  CL 107 108  CO2 23 26  GLUCOSE 97 103*  BUN 25* 26*  CREATININE 3.67* 4.07*  CALCIUM 7.9* 8.1*   No results found for this basename: PTH:2 in the last 72 hours Iron Studies: No results found for this basename: IRON,TIBC,TRANSFERRIN,FERRITIN in the last 72 hours Studies/Results: No results found. Scheduled:   . citalopram  20 mg Oral Daily  . clindamycin (CLEOCIN) IV  600 mg Intravenous Q6H  . docusate sodium  100 mg Oral BID  . fentaNYL  50 mcg Transdermal Q72H  . gabapentin  100 mg Oral TID  . insulin aspart  0-5 Units Subcutaneous QHS  . insulin aspart  0-9 Units Subcutaneous TID WC  . levothyroxine  25 mcg Oral QAC breakfast  . pantoprazole  40 mg Oral Daily  . sodium chloride  3 mL Intravenous Q12H  . warfarin  1 mg Oral ONCE-1800  . Warfarin - Pharmacist Dosing  Inpatient   Does not apply q1800    LOS: 9 days   Rody Keadle C 07/24/2012,11:45 AM

## 2012-07-24 NOTE — Progress Notes (Signed)
[facility] PROGRESS NOTE  [name] [name] ZOX:[id number] DOB: 17-Feb-1941 DOA: 07/15/2012 PCP: Louie Boston, MD  Assessment/Plan: 1. ARF - developed during admission at [facility] while patient was taking Vancomycin and Zosyn. Patient was admitted with hypotension at [facility]. Patient with known normal creatinine on 08/20/2011.  - Nephrology on board and making further recommendations.  Creatinine improving and has gone from 4.07 to 3.67  Will continue to monitor and f/u with nephrology's recommendations.  Patient with continued improvement may be discharged in 1-2 days. 2. HTN: Last recorded blood pressure 132/56.  At this juncture will continue to monitor. 3. Mild LLE Cellulitis - non toxic, improving, received iv Vanc and Zosyn at [facility] until 12/3 - switched to iv clinda upon transfer to [facility] on 12/4.  Today is day # 10 of clindamycin.  Would favor treating for 14 days total. 4. DM - SSI 5. 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 Placed TEDs 6. Chronic back pain with hx of multiple surgeries and chronic paraparesis - continue fentanyl patch at reduced dose   7. Hx of gastric bypass surgery  8. Volume overload - resumed lasix 07/17/12 - stopped Lasix 07/20/12 once patient started being in the polyuric phase  9. Anemia due to acute illness and chronic ds. 10. New diagnosis of hypothyroidism - patient started on po Synthroid 07/21/12    Code Status: full Family Communication: No family at bedside. Disposition Plan: home   Consultants:  Renal   Procedures:  none  Antibiotics: Clindamycin 12/5  HPI/Subjective: No new complaints reported. No acute issues overnight  Objective: Filed Vitals:   07/23/12 2201 07/24/12 0457 07/24/12 0945 07/24/12 1339  BP: 137/59 159/69 135/51 132/56  Pulse: 81 76 89 74  Temp: 97.9 F (36.6 C) 97.9 F (36.6 C) 97.9 F (36.6 C) 98 F (36.7 C)  TempSrc: Oral Oral Oral Oral  Resp:  20 20 18 18   Height:      Weight: 134.446 kg (296 lb 6.4 oz)     SpO2: 98% 92% 97% 95%    Intake/Output Summary (Last 24 hours) at 07/24/12 1531 Last data filed at 07/24/12 0332  Gross per 24 hour  Intake    240 ml  Output    800 ml  Net   -560 ml   Filed Weights   07/21/12 2110 07/22/12 2023 07/23/12 2201  Weight: 133.494 kg (294 lb 4.8 oz) 133.494 kg (294 lb 4.8 oz) 134.446 kg (296 lb 6.4 oz)    Exam:   General:  Alert and Awake. Smiling in bed  Cardiovascular: rrr, nl S1, S2   Respiratory: ctab, no w,r,c   Abdomen: soft, nt, obese LE bilat edema +1, patient with bilat ace wraps ,  LLE with minimal erythema   Data Reviewed: Basic Metabolic Panel:  Lab 07/24/12 8119 07/23/12 0615 07/22/12 0615 07/21/12 0518 07/20/12 0520  NA 140 141 139 141 139  K 3.7 4.1 3.9 4.5 4.1  CL 107 108 106 107 108  CO2 23 26 23 25 24   GLUCOSE 97 103* 94 99 88  BUN 25* 26* 29* 31* 31*  CREATININE 3.67* 4.07* 4.32* 4.92* 5.02*  CALCIUM 7.9* 8.1* 7.8* 7.9* 7.9*  MG -- -- -- -- --  PHOS 5.2* 5.2* 5.2* 5.7* 5.3*   Liver Function Tests:  Lab 07/24/12 0610 07/23/12 0615 07/22/12 0615 07/21/12 0518 07/20/12 0520  AST -- -- -- -- --  ALT -- -- -- -- --  ALKPHOS -- -- -- -- --  BILITOT -- -- -- -- --  PROT -- -- -- -- --  ALBUMIN 2.2* 2.3* 2.2* 2.1* 2.0*   No results found for this basename: LIPASE:5,AMYLASE:5 in the last 168 hours No results found for this basename: AMMONIA:5 in the last 168 hours CBC:  Lab 07/20/12 0520 07/18/12 0605  WBC 4.3 6.2  NEUTROABS -- 3.5  HGB 8.8* 8.6*  HCT 28.2* 27.7*  MCV 82.7 84.7  PLT 152 162   Cardiac Enzymes: No results found for this basename: CKTOTAL:5,CKMB:5,CKMBINDEX:5,TROPONINI:5 in the last 168 hours BNP (last 3 results) No results found for this basename: PROBNP:3 in the last 8760 hours CBG:  Lab 07/24/12 1206 07/24/12 0737 07/23/12 2050 07/23/12 1640 07/23/12 1156  GLUCAP 131* 95 96 107* 110*    No results found for this or any  previous visit (from the past 240 hour(s)).   Studies: No results found. Results for [name] [name], [name] [name] (MRN [id number]) as of 07/17/2012 07:56  Ref. Range 07/16/2012 15:55  Color, Urine Latest Range: YELLOW  YELLOW  APPearance Latest Range: CLEAR  CLOUDY (A)  Specific Gravity, Urine Latest Range: 1.005-1.030  1.010  pH Latest Range: 5.0-8.0  5.0  Glucose Latest Range: NEGATIVE mg/dL NEGATIVE  Bilirubin Urine Latest Range: NEGATIVE  SMALL (A)  Ketones, ur Latest Range: NEGATIVE mg/dL NEGATIVE  Protein Latest Range: NEGATIVE mg/dL NEGATIVE  Urobilinogen, UA Latest Range: 0.0-1.0 mg/dL 0.2  Nitrite Latest Range: NEGATIVE  NEGATIVE  Leukocytes, UA Latest Range: NEGATIVE  MODERATE (A)  Hgb urine dipstick Latest Range: NEGATIVE  SMALL (A)  Urine-Other No range found MANY YEAST  WBC, UA Latest Range: <3 WBC/hpf 11-20  RBC / HPF Latest Range: <3 RBC/hpf 3-6  Squamous Epithelial / LPF Latest Range: RARE  RARE  Bacteria, UA Latest Range: RARE  RARE   Scheduled Meds:    . citalopram  20 mg Oral Daily  . clindamycin (CLEOCIN) IV  600 mg Intravenous Q6H  . docusate sodium  100 mg Oral BID  . fentaNYL  50 mcg Transdermal Q72H  . gabapentin  100 mg Oral TID  . insulin aspart  0-5 Units Subcutaneous QHS  . insulin aspart  0-9 Units Subcutaneous TID WC  . levothyroxine  25 mcg Oral QAC breakfast  . pantoprazole  40 mg Oral Daily  . sodium chloride  3 mL Intravenous Q12H  . warfarin  1 mg Oral ONCE-1800  . Warfarin - Pharmacist Dosing Inpatient   Does not apply q1800   Continuous Infusions:    Active Problems:  DM  Essential hypertension, benign  Cellulitis  Acute renal failure  History of DVT of lower extremity  History of pulmonary embolism       Penny Pia  [facility] Pager [phone]. If 8PM-8AM, please contact night-coverage at [email or web address], password [facility] 07/24/2012, 3:31 PM  LOS: 9 days

## 2012-07-24 NOTE — Progress Notes (Signed)
ANTICOAGULATION CONSULT NOTE - Follow Up Consult  Pharmacy Consult for Warfarin Indication: Hx PE/DVT s/p IVC filter placement  Allergies  Allergen Reactions  . Bee Venom   . Codeine Palpitations    Per patient, "Feels like I'm having a heart attack."    Patient Measurements: Height: 5\' 7"  (170.2 cm) Weight: 296 lb 6.4 oz (134.446 kg) IBW/kg (Calculated) : 61.6   Vital Signs: Temp: 97.9 F (36.6 C) (12/13 0457) Temp src: Oral (12/13 0457) BP: 159/69 mmHg (12/13 0457) Pulse Rate: 76  (12/13 0457)  Labs:  Basename 07/24/12 0610 07/23/12 0615 07/22/12 0615  HGB -- -- --  HCT -- -- --  PLT -- -- --  APTT -- -- --  LABPROT 27.1* 29.8* 28.6*  INR 2.67* 3.03* 2.87*  HEPARINUNFRC -- -- --  CREATININE 3.67* 4.07* 4.32*  CKTOTAL -- -- --  CKMB -- -- --  TROPONINI -- -- --    Estimated Creatinine Clearance: 20.4 ml/min (by C-G formula based on Cr of 3.67).  Assessment:  71 y.o. F transferred from [facility] with acute renal failure and cellulitis who is on chronic warfarin for h/o PE and DVT s/p IVC filter, to continue warfarin per pharmacy. INR is now at goal at 2.67. No CBC today, no s/sx of bleeding noted at this time. The patient was re-educated on warfarin this admission.   Previously, the patient was known to be taking 3 mg daily EXCEPT for 4 mg on Tues/Thurs. Given trends this admission, the patient will need a lower dose upon discharge.   Goal of Therapy:  INR 2-3   Plan: 1. Will repeat low dose coumadin at 1mg  PO x 1 tonight 2. Will continue to monitor for any signs/symptoms of bleeding and will follow up with PT/INR in the a.m. 3. Consider changing clindamycin to PO and addressing LOT  Lysle Pearl, PharmD, BCPS Pager # [phone] 07/24/2012 8:45 AM

## 2012-07-25 LAB — RENAL FUNCTION PANEL
CO2: 25 mEq/L (ref 19–32)
Chloride: 109 mEq/L (ref 96–112)
Creatinine, Ser: 3.55 mg/dL — ABNORMAL HIGH (ref 0.50–1.10)
GFR calc Af Amer: 14 mL/min — ABNORMAL LOW (ref >90)
GFR calc non Af Amer: 12 mL/min — ABNORMAL LOW (ref >90)
Glucose, Bld: 90 mg/dL (ref 70–99)
Sodium: 142 mEq/L (ref 135–145)

## 2012-07-25 LAB — GLUCOSE, CAPILLARY: Glucose-Capillary: 95 mg/dL (ref 70–99)

## 2012-07-25 LAB — PROTIME-INR: INR: 1.98 — ABNORMAL HIGH (ref 0.00–1.49)

## 2012-07-25 MED ORDER — CLINDAMYCIN HCL 300 MG PO CAPS: 300.0000 mg | ORAL_CAPSULE | Freq: Four times a day (QID) | ORAL | Status: DC

## 2012-07-25 MED ORDER — LEVOTHYROXINE SODIUM 25 MCG PO TABS: 25.0000 ug | ORAL_TABLET | Freq: Every day | ORAL | Status: DC

## 2012-07-25 MED ORDER — METHOCARBAMOL 750 MG PO TABS: 750.0000 mg | ORAL_TABLET | Freq: Three times a day (TID) | ORAL | Status: DC | PRN

## 2012-07-25 MED ORDER — FENTANYL 25 MCG/HR TD PT72: 1.0000 | MEDICATED_PATCH | TRANSDERMAL | Status: DC

## 2012-07-25 MED ORDER — WARFARIN SODIUM 1 MG PO TABS: 1.0000 mg | ORAL_TABLET | Freq: Every day | ORAL | Status: DC

## 2012-07-25 MED ORDER — GABAPENTIN 100 MG PO CAPS: 100.0000 mg | ORAL_CAPSULE | Freq: Three times a day (TID) | ORAL | Status: DC

## 2012-07-25 NOTE — Progress Notes (Signed)
Assessment/Rec:  1. Acute renal failure, Nonoliguric-Recovery phase.   Recs: I think patient is safe from renal perspective for discharge whenever outpatient logistics clarified with close outpt f/u of labs by her primary MD in [place].  We will be happy to see in about 3 to 4 weeks in [place].  I do anticipate ongoing improvement in kidney function probably to normal or close assuming adequate post hospital hydration  Subjective: Interval History: No new events  Objective: Vital signs in last 24 hours: Temp:  [97.8 F (36.6 C)-98.1 F (36.7 C)] 97.8 F (36.6 C) (12/14 0444) Pulse Rate:  [71-89] 71  (12/14 0444) Resp:  [18] 18  (12/14 0444) BP: (115-135)/(51-73) 125/51 mmHg (12/14 0444) SpO2:  [94 %-97 %] 97 % (12/14 0444) Weight:  [131.997 kg (291 lb)] 131.997 kg (291 lb) (12/13 2119) Weight change: -2.449 kg (-5 lb 6.4 oz)  Intake/Output from previous day: 12/13 0701 - 12/14 0700 In: 1350 [IV Piggyback:1350] Out: -  Intake/Output this shift:   General appearance: alert and cooperative Resp: clear to auscultation bilaterally Chest wall: no tenderness Cardio: regular rate and rhythm, S1, S2 normal, no murmur, click, rub or gallop Extremities: edema 2+ chronic changes with leg wraps  Lab Results: No results found for this basename: WBC:2,HGB:2,HCT:2,PLT:2 in the last 72 hours BMET:  [facility] 07/25/12 0700 07/24/12 0610  NA 142 140  K 4.3 3.7  CL 109 107  CO2 25 23  GLUCOSE 90 97  BUN 23 25*  CREATININE 3.55* 3.67*  CALCIUM 8.0* 7.9*   No results found for this basename: PTH:2 in the last 72 hours Iron Studies: No results found for this basename: IRON,TIBC,TRANSFERRIN,FERRITIN in the last 72 hours Studies/Results: No results found.  Scheduled:   . citalopram  20 mg Oral Daily  . clindamycin (CLEOCIN) IV  600 mg Intravenous Q6H  . docusate sodium  100 mg Oral BID  . fentaNYL  50 mcg Transdermal Q72H  . gabapentin  100 mg Oral TID  . insulin aspart  0-5 Units  Subcutaneous QHS  . insulin aspart  0-9 Units Subcutaneous TID WC  . levothyroxine  25 mcg Oral QAC breakfast  . pantoprazole  40 mg Oral Daily  . sodium chloride  3 mL Intravenous Q12H  . Warfarin - Pharmacist Dosing Inpatient   Does not apply q1800     LOS: 10 days   Mckynleigh Mussell C 07/25/2012,9:34 AM

## 2012-07-25 NOTE — Discharge Summary (Signed)
Physician Discharge Summary  [name] [name]:[id number] DOB: 02/09/1941 DOA: 07/15/2012  PCP: Louie Boston, MD  Admit date: 07/15/2012 Discharge date: 07/25/2012  Time spent: 35 minutes  Recommendations for Outpatient Follow-up:  1. Please recheck creatinine levels 2. New diagnosis of hypothyroidism and as such patient started on therapy while in house. Please recheck tsh 3. Discharged on clindamycin and will complete a 14 day course total of antibiotics.  Please reevaluate and recommend whether or not patient will require further therapy 4. Avoid nephrotoxic agents 5. Please follow up with blood sugar levels and adjust medication pending results.  Discharge Diagnoses:  Active Problems:  DM  Essential hypertension, benign  Cellulitis  Acute renal failure  History of DVT of lower extremity  History of pulmonary embolism   Discharge Condition: stable  Diet recommendation: Renal  Filed Weights   07/22/12 2023 07/23/12 2201 07/24/12 2119  Weight: 133.494 kg (294 lb 4.8 oz) 134.446 kg (296 lb 6.4 oz) 131.997 kg (291 lb)    History of present illness:  From original HPI: [name] [name] is a 71 y.o. female  has a past medical history of Other chest pain; Other chronic pain; Esophageal reflux; Unspecified essential hypertension; Personal history of pulmonary embolism; Myalgia and myositis, unspecified; Coronary atherosclerosis of native coronary artery; Dysthymic disorder; Morbid obesity; and Type II or unspecified type diabetes mellitus without mention of complication, not stated as uncontrolled.  Presented with  Patient was initially admitted to [facility] on 11/26 with increased confusion and shaking chills she was found to be in early sepsis and evidence of Left lower ext cellulitis. She was started on vancomycin and zosyn (and later that was changed to meropenem). Initial Cr was within normal limits at 0.95. Of note she has hx of DVT and PE sp IVC. She also has hx of  CHF and prior to admission her lasix had been increased to 80 mg po qd and Torsemide 40 mg BID has been added.  On 11/30 her Cr. Was noted to increase to 2.46 with GFR 19 and vanc trough was noted to be 36.9 and was discontinued but on the 12/4 her cr was up to 4.29 w GFR 10 and K 4.8 and vanc trough was 22.5.  She was started on high dose lasix 200 mg q 8 h but without improvement.  From her cellulitis stand point her leg has improved but today family noted some return of mild redness and warmth.  Of note a Left leg doppler was done and reportedly was negative.  Of note her INR have been also supra-theraputic the last was 3.1 and her warfarin has been held.  Of note her sed rate was elevated at 84  Of note CT scan on admission did find numerous diffused lymph nodes worrisome for metastic disease vs. lymphoma and oncology consultant have seen her. Their recommendation was to attempt to obtain tissue diagnosis when patient stabilizes and acute issues resolve and then follow up with them .    Hospital Course:  1. ARF - developed during admission at [facility] while patient was taking Vancomycin and Zosyn. Patient was admitted with hypotension at [facility]. Patient with known normal creatinine on 08/20/2011.  - Nephrology on board and making further recommendations. Creatinine improving and has gone from 4.07 to 3.67 Will continue to monitor and f/u with nephrology's recommendations. Patient with continued improvement may be discharged in 1-2 days.  2. HTN: will continue to hold antihypertensive medication as blood pressure has been relatively well  controlled of the medication. 3. Mild LLE Cellulitis - non toxic, improving, received iv Vanc and Zosyn at [facility] until 12/3 - switched to iv clinda upon transfer to [facility] on 12/4. Today is day # 11 of clindamycin. Would favor treating for 14-15 days total. Would recommend further evaluation by PCP 4. DM - SSi while in house although last hgba1c 5.8  therefore recommend lifestyle modification and no oral hypoglycemic agents. 5. 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 Will prescribe 1 mg coumadin tablets as that is what patient has been receiving while in house. 6. Chronic back pain with hx of multiple surgeries and chronic paraparesis - continue fentanyl patch at reduced dose  7. Hx of gastric bypass surgery  8. Volume overload - resumed lasix 07/17/12 - stopped Lasix 07/20/12 once patient started being in the polyuric phase. Will discharge off of lasix. 9. Anemia due to acute illness and chronic ds. 10. New diagnosis of hypothyroidism - patient started on po Synthroid 07/21/12. Will need thyroid levels rechecked   Procedures:  none  Consultations:  Nephrology  Discharge Exam: Filed Vitals:   07/24/12 1741 07/24/12 2119 07/25/12 0444 07/25/12 1025  BP: 127/73 115/63 125/51 136/82  Pulse: 74 80 71 77  Temp: 98.1 F (36.7 C) 97.9 F (36.6 C) 97.8 F (36.6 C) 98.5 F (36.9 C)  TempSrc: Oral Oral Oral Oral  Resp: 18 18 18 18   Height:      Weight:  131.997 kg (291 lb)    SpO2: 94% 97% 97% 96%    General: Pt in NAD, Alert and Oriented Cardiovascular: RRR, No MRG Respiratory: no increased work of breathing, speaking in full sentences, no wheezes  Discharge Instructions  Discharge Orders    Future Orders Please Complete By Expires   Diet - low sodium heart healthy      Increase activity slowly      Discharge instructions      Comments:   Please follow up with nephrology in 3-4 weeks.  Also be sure to follow up with your primary care physician in 1 week to recheck your creatinine level.   Avoid diuretics like caffeine and tea and drink plenty of water once home.   Call MD for:  temperature >100.4      Call MD for:  difficulty breathing, headache or visual disturbances      Call MD for:  persistant dizziness or light-headedness          Medication List     As of  07/25/2012  1:32 PM    STOP taking these medications         fentaNYL 100 MCG/HR   Commonly known as: DURAGESIC - dosed mcg/hr      fentaNYL 50 MCG/HR   Commonly known as: DURAGESIC - dosed mcg/hr   Replaced by: fentaNYL 25 MCG/HR      furosemide 40 MG tablet   Commonly known as: LASIX      isosorbide mononitrate 30 MG 24 hr tablet   Commonly known as: IMDUR      magnesium oxide 400 MG tablet   Commonly known as: MAG-OX      metoprolol tartrate 25 MG tablet   Commonly known as: LOPRESSOR      nitroGLYCERIN 0.4 MG SL tablet   Commonly known as: NITROSTAT      potassium chloride 10 MEQ tablet   Commonly known as: K-DUR,KLOR-CON      spironolactone 25 MG  tablet   Commonly known as: ALDACTONE      torsemide 20 MG tablet   Commonly known as: DEMADEX      TAKE these medications         ALPRAZolam 0.5 MG tablet   Commonly known as: XANAX   Take 0.5 mg by mouth 3 (three) times daily as needed. For anxiety      citalopram 20 MG tablet   Commonly known as: CELEXA   Take 20 mg by mouth daily.      fentaNYL 25 MCG/HR   Commonly known as: DURAGESIC - dosed mcg/hr   Place 1 patch (25 mcg total) onto the skin every 3 (three) days.      gabapentin 100 MG capsule   Commonly known as: NEURONTIN   Take 1 capsule (100 mg total) by mouth 3 (three) times daily.      levothyroxine 25 MCG tablet   Commonly known as: SYNTHROID, LEVOTHROID   Take 1 tablet (25 mcg total) by mouth daily before breakfast.      methocarbamol 750 MG tablet   Commonly known as: ROBAXIN   Take 1 tablet (750 mg total) by mouth 3 (three) times daily as needed.      multivitamin tablet   Take 1 tablet by mouth daily.      nystatin cream   Commonly known as: MYCOSTATIN   Apply 1 application topically 2 (two) times daily as needed. For skin irritation      oxycodone 5 MG capsule   Commonly known as: OXY-IR   Take 5 mg by mouth every 6 (six) hours as needed. pain      warfarin 1 MG tablet    Commonly known as: COUMADIN   Take 1 tablet (1 mg total) by mouth daily. Mon, Wed, Fri, Sat, Sun          The results of significant diagnostics from this hospitalization (including imaging, microbiology, ancillary and laboratory) are listed below for reference.    Significant Diagnostic Studies: US Renal  07/16/2012  *RADIOLOGY REPORT*  Clinical Data: Renal failure.  RENAL/URINARY TRACT ULTRASOUND COMPLETE  Comparison:  07/13/2012.  Findings:  Right Kidney:  11.7 cm. Normal echotexture.  Normal central sinus echo complex.  No calculi or hydronephrosis.  Left Kidney:  13.0 cm. Normal echotexture.  Normal central sinus echo complex.  No calculi or hydronephrosis. Inferior pole is difficult to visualize due to patient body habitus.  Bladder:  Foley catheter.  Urinary bladder decompressed.  The exam is technically degraded due to obese body habitus.  IMPRESSION:  1.  Normal appearance of the kidneys. 2.  Suboptimal exam due to obese body habitus. 3.  Decompressed urinary bladder with Foley catheter.   Original Report Authenticated By: Andreas Newport, M.D.    Dg Chest Port 1 View  07/19/2012  *RADIOLOGY REPORT*  Clinical Data: Questionable edema.  Acute renal failure.  Fever and weakness.  PORTABLE CHEST - 1 VIEW  Comparison: 07/07/2012  Findings: The cardiac silhouette is normal in size and configuration.  No mediastinal or hilar mass or adenopathy.  Minor linear opacity in the left lateral lower lung is likely subsegmental atelectasis or scarring.  The lungs otherwise clear. No pleural effusion or pneumothorax.  The bony thorax is demineralized.  IMPRESSION: No acute cardiopulmonary disease.   Original Report Authenticated By: Amie Portland, M.D.     Microbiology: No results found for this or any previous visit (from the past 240 hour(s)).   Labs: Basic Metabolic Panel:  Lab 07/25/12 0700 07/24/12 0610 07/23/12 0615 07/22/12 0615 07/21/12 0518  NA 142 140 141 139 141  K 4.3 3.7 4.1 3.9 4.5   CL 109 107 108 106 107  CO2 25 23 26 23 25   GLUCOSE 90 97 103* 94 99  BUN 23 25* 26* 29* 31*  CREATININE 3.55* 3.67* 4.07* 4.32* 4.92*  CALCIUM 8.0* 7.9* 8.1* 7.8* 7.9*  MG -- -- -- -- --  PHOS 5.2* 5.2* 5.2* 5.2* 5.7*   Liver Function Tests:  Lab 07/25/12 0700 07/24/12 0610 07/23/12 0615 07/22/12 0615 07/21/12 0518  AST -- -- -- -- --  ALT -- -- -- -- --  ALKPHOS -- -- -- -- --  BILITOT -- -- -- -- --  PROT -- -- -- -- --  ALBUMIN 2.2* 2.2* 2.3* 2.2* 2.1*   No results found for this basename: LIPASE:5,AMYLASE:5 in the last 168 hours No results found for this basename: AMMONIA:5 in the last 168 hours CBC:  Lab 07/20/12 0520  WBC 4.3  NEUTROABS --  HGB 8.8*  HCT 28.2*  MCV 82.7  PLT 152   Cardiac Enzymes: No results found for this basename: CKTOTAL:5,CKMB:5,CKMBINDEX:5,TROPONINI:5 in the last 168 hours BNP: BNP (last 3 results) No results found for this basename: PROBNP:3 in the last 8760 hours CBG:  Lab 07/25/12 1133 07/25/12 0748 07/24/12 2115 07/24/12 1744 07/24/12 1206  GLUCAP 101* 95 92 92 131*       Signed:  Zuriah Bordas  [facility] 07/25/2012, 1:32 PM

## 2012-07-25 NOTE — Progress Notes (Signed)
Pt discharged to home after visit summary reviewed and pt capable of re verbalizing medications and follow up appointments. Pt remains stable. No signs and symptoms of distress. Educated to return to ER in the event of SOB, dizziness, chest pain, or fainting. Mikale Silversmith, RN   

## 2012-07-25 NOTE — Progress Notes (Deleted)
Pt discharged to home after visit summary reviewed and pt capable of re verbalizing medications and follow up appointments. Pt remains stable. No signs and symptoms of distress. Educated to return to ER in the event of SOB, dizziness, chest pain, or fainting. Joselynne Killam, RN   

## 2012-08-04 ENCOUNTER — Inpatient Hospital Stay (HOSPITAL_COMMUNITY)
Admission: EM | Admit: 2012-08-04 | Discharge: 2012-08-08 | DRG: 392 | Disposition: A | Discharge status: Home Health | Payer: Medicare Other | Attending: Internal Medicine | Admitting: Internal Medicine

## 2012-08-04 ENCOUNTER — Emergency Department (HOSPITAL_COMMUNITY): Payer: Medicare Other

## 2012-08-04 ENCOUNTER — Encounter (HOSPITAL_COMMUNITY): Payer: Self-pay | Admitting: *Deleted

## 2012-08-04 DIAGNOSIS — E039 Hypothyroidism, unspecified: Secondary | ICD-10-CM | POA: Diagnosis present

## 2012-08-04 DIAGNOSIS — F341 Dysthymic disorder: Secondary | ICD-10-CM | POA: Diagnosis present

## 2012-08-04 DIAGNOSIS — R0602 Shortness of breath: Secondary | ICD-10-CM

## 2012-08-04 DIAGNOSIS — J159 Unspecified bacterial pneumonia: Secondary | ICD-10-CM

## 2012-08-04 DIAGNOSIS — K5289 Other specified noninfective gastroenteritis and colitis: Principal | ICD-10-CM | POA: Diagnosis present

## 2012-08-04 DIAGNOSIS — L02419 Cutaneous abscess of limb, unspecified: Secondary | ICD-10-CM | POA: Diagnosis present

## 2012-08-04 DIAGNOSIS — E876 Hypokalemia: Secondary | ICD-10-CM

## 2012-08-04 DIAGNOSIS — R0789 Other chest pain: Secondary | ICD-10-CM

## 2012-08-04 DIAGNOSIS — I10 Essential (primary) hypertension: Secondary | ICD-10-CM | POA: Diagnosis present

## 2012-08-04 DIAGNOSIS — E119 Type 2 diabetes mellitus without complications: Secondary | ICD-10-CM

## 2012-08-04 DIAGNOSIS — R6521 Severe sepsis with septic shock: Secondary | ICD-10-CM

## 2012-08-04 DIAGNOSIS — M549 Dorsalgia, unspecified: Secondary | ICD-10-CM

## 2012-08-04 DIAGNOSIS — Z8679 Personal history of other diseases of the circulatory system: Secondary | ICD-10-CM

## 2012-08-04 DIAGNOSIS — N289 Disorder of kidney and ureter, unspecified: Secondary | ICD-10-CM | POA: Diagnosis present

## 2012-08-04 DIAGNOSIS — E7800 Pure hypercholesterolemia, unspecified: Secondary | ICD-10-CM

## 2012-08-04 DIAGNOSIS — Z86711 Personal history of pulmonary embolism: Secondary | ICD-10-CM

## 2012-08-04 DIAGNOSIS — K529 Noninfective gastroenteritis and colitis, unspecified: Secondary | ICD-10-CM | POA: Diagnosis present

## 2012-08-04 DIAGNOSIS — Z9884 Bariatric surgery status: Secondary | ICD-10-CM

## 2012-08-04 DIAGNOSIS — Z8249 Family history of ischemic heart disease and other diseases of the circulatory system: Secondary | ICD-10-CM

## 2012-08-04 DIAGNOSIS — Z86718 Personal history of other venous thrombosis and embolism: Secondary | ICD-10-CM

## 2012-08-04 DIAGNOSIS — G43909 Migraine, unspecified, not intractable, without status migrainosus: Secondary | ICD-10-CM

## 2012-08-04 DIAGNOSIS — R531 Weakness: Secondary | ICD-10-CM

## 2012-08-04 DIAGNOSIS — Z860100 Personal history of colon polyps, unspecified: Secondary | ICD-10-CM

## 2012-08-04 DIAGNOSIS — R5381 Other malaise: Secondary | ICD-10-CM | POA: Diagnosis present

## 2012-08-04 DIAGNOSIS — Z9089 Acquired absence of other organs: Secondary | ICD-10-CM

## 2012-08-04 DIAGNOSIS — Z79899 Other long term (current) drug therapy: Secondary | ICD-10-CM

## 2012-08-04 DIAGNOSIS — Z885 Allergy status to narcotic agent status: Secondary | ICD-10-CM

## 2012-08-04 DIAGNOSIS — I2510 Atherosclerotic heart disease of native coronary artery without angina pectoris: Secondary | ICD-10-CM | POA: Diagnosis present

## 2012-08-04 DIAGNOSIS — A419 Sepsis, unspecified organism: Secondary | ICD-10-CM

## 2012-08-04 DIAGNOSIS — Z792 Long term (current) use of antibiotics: Secondary | ICD-10-CM

## 2012-08-04 DIAGNOSIS — R609 Edema, unspecified: Secondary | ICD-10-CM | POA: Diagnosis present

## 2012-08-04 DIAGNOSIS — K219 Gastro-esophageal reflux disease without esophagitis: Secondary | ICD-10-CM | POA: Diagnosis present

## 2012-08-04 DIAGNOSIS — Z7901 Long term (current) use of anticoagulants: Secondary | ICD-10-CM

## 2012-08-04 DIAGNOSIS — L0390 Cellulitis, unspecified: Secondary | ICD-10-CM

## 2012-08-04 DIAGNOSIS — R930 Abnormal findings on diagnostic imaging of skull and head, not elsewhere classified: Secondary | ICD-10-CM

## 2012-08-04 DIAGNOSIS — R600 Localized edema: Secondary | ICD-10-CM | POA: Diagnosis present

## 2012-08-04 DIAGNOSIS — Z90710 Acquired absence of both cervix and uterus: Secondary | ICD-10-CM

## 2012-08-04 DIAGNOSIS — N179 Acute kidney failure, unspecified: Secondary | ICD-10-CM

## 2012-08-04 DIAGNOSIS — IMO0001 Reserved for inherently not codable concepts without codable children: Secondary | ICD-10-CM | POA: Diagnosis present

## 2012-08-04 DIAGNOSIS — L03119 Cellulitis of unspecified part of limb: Secondary | ICD-10-CM | POA: Diagnosis present

## 2012-08-04 DIAGNOSIS — I252 Old myocardial infarction: Secondary | ICD-10-CM

## 2012-08-04 DIAGNOSIS — G8929 Other chronic pain: Secondary | ICD-10-CM | POA: Diagnosis present

## 2012-08-04 DIAGNOSIS — J9600 Acute respiratory failure, unspecified whether with hypoxia or hypercapnia: Secondary | ICD-10-CM

## 2012-08-04 LAB — CBC
HCT: 31.7 % — ABNORMAL LOW (ref 36.0–46.0)
Platelets: 125 10*3/uL — ABNORMAL LOW (ref 150–400)
RDW: 18.4 % — ABNORMAL HIGH (ref 11.5–15.5)
WBC: 6.8 10*3/uL (ref 4.0–10.5)

## 2012-08-04 LAB — COMPREHENSIVE METABOLIC PANEL
ALT: 15 U/L (ref 0–35)
AST: 28 U/L (ref 0–37)
Albumin: 3.2 g/dL — ABNORMAL LOW (ref 3.5–5.2)
Alkaline Phosphatase: 88 U/L (ref 39–117)
Glucose, Bld: 112 mg/dL — ABNORMAL HIGH (ref 70–99)
Potassium: 3.5 mEq/L (ref 3.5–5.1)
Sodium: 140 mEq/L (ref 135–145)
Total Protein: 6.3 g/dL (ref 6.0–8.3)

## 2012-08-04 LAB — POCT I-STAT TROPONIN I

## 2012-08-04 MED ORDER — ALPRAZOLAM 0.25 MG PO TABS
0.5000 mg | ORAL_TABLET | Freq: Once | ORAL | Status: AC
  Administered 2012-08-04: 0.5 mg via ORAL
  Filled 2012-08-04: qty 2

## 2012-08-04 MED ORDER — OXYCODONE HCL 5 MG PO CAPS: 5.0000 mg | ORAL_CAPSULE | Freq: Four times a day (QID) | ORAL | Status: DC | PRN

## 2012-08-04 MED ORDER — POLY-VITAMIN/IRON 10 MG/ML PO SOLN
1.0000 mL | Freq: Two times a day (BID) | ORAL | Status: DC
  Filled 2012-08-04 (×2): qty 1

## 2012-08-04 MED ORDER — LEVOTHYROXINE SODIUM 25 MCG PO TABS
25.0000 ug | ORAL_TABLET | Freq: Every day | ORAL | Status: DC
  Administered 2012-08-05 – 2012-08-08 (×4): 25 ug via ORAL
  Filled 2012-08-04 (×5): qty 1

## 2012-08-04 MED ORDER — WARFARIN SODIUM 3 MG PO TABS
3.0000 mg | ORAL_TABLET | ORAL | Status: AC
  Administered 2012-08-05: 3 mg via ORAL
  Filled 2012-08-04: qty 1

## 2012-08-04 MED ORDER — METRONIDAZOLE 500 MG PO TABS
500.0000 mg | ORAL_TABLET | Freq: Three times a day (TID) | ORAL | Status: DC
  Administered 2012-08-05 – 2012-08-07 (×8): 500 mg via ORAL
  Filled 2012-08-04 (×12): qty 1

## 2012-08-04 MED ORDER — ONE-DAILY MULTI VITAMINS PO TABS: 1.0000 | ORAL_TABLET | Freq: Every day | ORAL | Status: DC

## 2012-08-04 MED ORDER — ALPRAZOLAM 0.5 MG PO TABS
0.5000 mg | ORAL_TABLET | Freq: Three times a day (TID) | ORAL | Status: DC | PRN
  Administered 2012-08-05 – 2012-08-07 (×6): 0.5 mg via ORAL
  Filled 2012-08-04 (×6): qty 1

## 2012-08-04 MED ORDER — FENTANYL 25 MCG/HR TD PT72: 25.0000 ug | MEDICATED_PATCH | TRANSDERMAL | Status: DC

## 2012-08-04 MED ORDER — MORPHINE SULFATE 4 MG/ML IJ SOLN
4.0000 mg | Freq: Once | INTRAMUSCULAR | Status: AC
  Administered 2012-08-04: 4 mg via INTRAVENOUS
  Filled 2012-08-04: qty 1

## 2012-08-04 MED ORDER — WARFARIN - PHARMACIST DOSING INPATIENT: Freq: Every day | Status: DC

## 2012-08-04 MED ORDER — VITAMIN B-12 1000 MCG PO TABS
1000.0000 ug | ORAL_TABLET | Freq: Every day | ORAL | Status: DC
  Administered 2012-08-05 – 2012-08-08 (×4): 1000 ug via ORAL
  Filled 2012-08-04 (×4): qty 1

## 2012-08-04 NOTE — Progress Notes (Signed)
ANTICOAGULATION CONSULT NOTE - Initial Consult  Pharmacy Consult for Coumadin Indication: h/o PE/DVT  Allergies  Allergen Reactions  . Bee Venom   . Codeine Palpitations    Per patient, "Feels like I'm having a heart attack."    Vital Signs: Temp: 98.8 F (37.1 C) (12/24 1953) Temp src: Oral (12/24 1953) BP: 152/55 mmHg (12/24 2300) Pulse Rate: 79  (12/24 2300)  Labs:  [facility] 08/04/12 1539  HGB 10.0*  HCT 31.7*  PLT 125*  APTT --  LABPROT 22.1*  INR 2.03*  HEPARINUNFRC --  CREATININE 1.72*  CKTOTAL --  CKMB --  TROPONINI --    Medical History: Past Medical History  Diagnosis Date  . Other chest pain   . Other chronic pain   . Esophageal reflux   . Unspecified essential hypertension   . Personal history of pulmonary embolism   . Myalgia and myositis, unspecified   . Coronary atherosclerosis of native coronary artery     CATHETERIZATION X 3  . Dysthymic disorder   . Morbid obesity   . Type II or unspecified type diabetes mellitus without mention of complication, not stated as uncontrolled   . Fibromyalgia   . GERD (gastroesophageal reflux disease)   . Anxiety   . Myocardial infarction      Assessment/Plan: 71yo female to continue Coumadin for h/o PE/DVT during admission for colitis; current INR therapeutic at low end of goal though Flagyl has been started which will likely increase INR; will give regular home dose now and monitor INR closely for dose adjustments and consider alternative therapy for colitis.   Colleen Can PharmD BCPS 08/04/2012,11:40 PM

## 2012-08-04 NOTE — ED Notes (Signed)
Patient transported to X-ray 

## 2012-08-04 NOTE — ED Notes (Signed)
Report to Damon, Charity fundraiser

## 2012-08-04 NOTE — ED Provider Notes (Signed)
History     CSN: [id number]  Arrival date & time 08/04/12  1528   First MD Initiated Contact with Patient 08/04/12 1707      Chief Complaint  Patient presents with  . Shortness of Breath  . Leg Swelling    (Consider location/radiation/quality/duration/timing/severity/associated sxs/prior treatment) HPI Comments: Patient presents with shortness of breath for the past several days.  She has a history of cri and has been admitted here and at [facility] in the past month for arf, cellulitis and edema of the left leg.  The right leg has now become more swollen and she is having difficulty walking and moving.  She is extremely weak.  She is also complaining of generalized abdominal pain.  She denies fevers or chills.  No nausea, vomiting, or diarrhea.    The history is provided by the patient.    Past Medical History  Diagnosis Date  . Other chest pain   . Other chronic pain   . Esophageal reflux   . Unspecified essential hypertension   . Personal history of pulmonary embolism   . Myalgia and myositis, unspecified   . Coronary atherosclerosis of native coronary artery     CATHETERIZATION X 3  . Dysthymic disorder   . Morbid obesity   . Type II or unspecified type diabetes mellitus without mention of complication, not stated as uncontrolled   . Fibromyalgia   . GERD (gastroesophageal reflux disease)   . Anxiety   . Myocardial infarction     Past Surgical History  Procedure Date  . Back surgery     X 2 FOR DISK PROBLEMS  . Abdominal hysterectomy   . Gastric bypass     HISTORY OF  . Replacement total knee   . Carpal tunnel release   . Bladder suspension   . Appendectomy   . Cholecystectomy     Family History  Problem Relation Age of Onset  . Coronary artery disease      FAMILY HISTORY  . Diabetes type II Mother   . Heart disease Mother   . Lymphoma Father     History  Substance Use Topics  . Smoking status: Never Smoker   . Smokeless tobacco: Never Used  .  Alcohol Use: No    OB History    Grav Para Term Preterm Abortions TAB SAB Ect Mult Living                  Review of Systems  Constitutional: Positive for fatigue. Negative for fever, chills and diaphoresis.  Respiratory: Positive for shortness of breath.   Cardiovascular: Negative for chest pain.  Musculoskeletal:       Leg swelling  All other systems reviewed and are negative.    Allergies  Bee venom and Codeine  Home Medications   Current Outpatient Rx  Name  Route  Sig  Dispense  Refill  . ALPRAZOLAM 0.5 MG PO TABS   Oral   Take 0.5 mg by mouth 3 (three) times daily as needed. For anxiety         . FENTANYL 25 MCG/HR TD PT72   Transdermal   Place 1 patch (25 mcg total) onto the skin every 3 (three) days.   5 patch   0   . LEVOTHYROXINE SODIUM 25 MCG PO TABS   Oral   Take 25 mcg by mouth daily.         Marland Kitchen ONE-DAILY MULTI VITAMINS PO TABS   Oral   Take 1 tablet  by mouth daily.           . NYSTATIN 100000 UNIT/GM EX CREA   Topical   Apply 1 application topically 2 (two) times daily as needed. For skin irritation         . OXYCODONE HCL 5 MG PO CAPS   Oral   Take 5 mg by mouth every 6 (six) hours as needed. pain         . POLY-VITAMIN/IRON 10 MG/ML PO SOLN   Oral   Take 1 mL by mouth 2 (two) times daily.         Marland Kitchen VITAMIN B-12 1000 MCG PO TABS   Oral   Take 1,000 mcg by mouth daily.         . WARFARIN SODIUM 1 MG PO TABS   Oral   Take 1-3 mg by mouth daily. Take 1mg  on Mon, Wed, Fri, Sat, Sun. And 3mg  rest of days           BP 177/88  Pulse 90  Temp 98.1 F (36.7 C) (Oral)  Resp 22  SpO2 95%  Physical Exam  Nursing note and vitals reviewed. Constitutional: She is oriented to person, place, and time.       The patient appears pale.    HENT:  Head: Normocephalic.  Mouth/Throat: Oropharynx is clear and moist.  Eyes: EOM are normal. Pupils are equal, round, and reactive to light.  Neck: Normal range of motion. Neck supple.   Cardiovascular: Normal rate and regular rhythm.   No murmur heard. Pulmonary/Chest: Effort normal and breath sounds normal.  Abdominal: Soft. Bowel sounds are normal. She exhibits no distension. There is no tenderness.  Musculoskeletal: She exhibits edema.       There is 3-4+ pitting edema in the left lower extremity and 2-3+ pitting edema in the right lower extremity.  Neurological: She is alert and oriented to person, place, and time.  Skin: Skin is warm. There is pallor.    ED Course  Procedures (including critical care time)  Labs Reviewed  CBC - Abnormal; Notable for the following:    RBC 3.73 (*)     Hemoglobin 10.0 (*)     HCT 31.7 (*)     RDW 18.4 (*)     Platelets 125 (*)     All other components within normal limits  PRO B NATRIURETIC PEPTIDE - Abnormal; Notable for the following:    Pro B Natriuretic peptide (BNP) 8892.0 (*)     All other components within normal limits  COMPREHENSIVE METABOLIC PANEL - Abnormal; Notable for the following:    Glucose, Bld 112 (*)     Creatinine, Ser 1.72 (*)     Albumin 3.2 (*)     GFR calc non Af Amer 29 (*)     GFR calc Af Amer 34 (*)     All other components within normal limits  PROTIME-INR - Abnormal; Notable for the following:    Prothrombin Time 22.1 (*)     INR 2.03 (*)     All other components within normal limits  POCT I-STAT TROPONIN I   Dg Chest 2 View  08/04/2012  *RADIOLOGY REPORT*  Clinical Data: Shortness of breath and leg swelling.  CHEST - 2 VIEW  Comparison: Chest radiograph 07/31/2012 and chest CT 11/15/2010  Findings: Mild cardiomegaly appears stable.  Mediastinal and hilar contours are within normal limits.  There is a small focal opacity at the posterolateral left lung base.  The right  lung is clear. The trachea is midline.  Surgical clips are seen in the left upper quadrant.  IMPRESSION: 1.  Small focal opacity in the left lower lobe is favored to be due to atelectasis.  Early / mild airspace disease cannot  be completely excluded. 2.  Mild cardiomegaly without failure.   Original Report Authenticated By: Britta Mccreedy, M.D.      No diagnosis found.   Date: 08/04/2012  Rate: 90  Rhythm: normal sinus rhythm  QRS Axis: left  Intervals: normal  ST/T Wave abnormalities: normal  Conduction Disutrbances:none  Narrative Interpretation:   Old EKG Reviewed: unchanged    MDM  The patient presents with abd pain, increasing weakness over the past week.  She has recently been admitted for edema, chf and was treated with antibiotics for possible cellulitis.  Today's workup reveals ascending and transverse colitis and elevated bnp.  I have consulted medicine for admission.          Geoffery Lyons, MD 08/04/12 [phone]

## 2012-08-04 NOTE — H&P (Signed)
[facility] History and Physical  LARUTH HANGER RUE:[id number] DOB: [birth year] DOA: 08/04/2012  Referring physician: ED PCP: Louie Boston, MD  Specialists: None  Chief Complaint: Abdominal pain, diarrhea  HPI: [name] [name] is a 71 y.o. female who presents with 2-3 day onset crampy quality abdominal pain located diffusely through her abdomen and bloody diarrhea.  Nothing makes symptoms better nor worse.  These symptoms occur in the context of a recent admission for AKF, cellulitis of the LLE treated with Zosyn, IV Vanc, Merrem, and finally 14 days of clindamycin.  In the ED her creatinine was noted to be significantly improved to 1.7 (was 4 at time of discharge from last admit during AKF).  CT scan of her abdomen demonstrated transverse and descending colitis.  Hospitalist has been asked to admit.  Review of Systems: no fever, chills, has had increased BLE swelling especially on the LLE, no severe dyspnea, 12 systems reviewed and otherwise negative.  Past Medical History  Diagnosis Date  . Other chest pain   . Other chronic pain   . Esophageal reflux   . Unspecified essential hypertension   . Personal history of pulmonary embolism   . Myalgia and myositis, unspecified   . Coronary atherosclerosis of native coronary artery     CATHETERIZATION X 3  . Dysthymic disorder   . Morbid obesity   . Type II or unspecified type diabetes mellitus without mention of complication, not stated as uncontrolled   . Fibromyalgia   . GERD (gastroesophageal reflux disease)   . Anxiety   . Myocardial infarction    Past Surgical History  Procedure Date  . Back surgery     X 2 FOR DISK PROBLEMS  . Abdominal hysterectomy   . Gastric bypass     HISTORY OF  . Replacement total knee   . Carpal tunnel release   . Bladder suspension   . Appendectomy   . Cholecystectomy    Social History:  reports that she has never smoked. She has never used smokeless tobacco. She reports that she does  not drink alcohol or use illicit drugs.   Allergies  Allergen Reactions  . Bee Venom   . Codeine Palpitations    Per patient, "Feels like I'm having a heart attack."    Family History  Problem Relation Age of Onset  . Coronary artery disease      FAMILY HISTORY  . Diabetes type II Mother   . Heart disease Mother   . Lymphoma Father      Prior to Admission medications   Medication Sig Start Date End Date Taking? Authorizing Provider  ALPRAZolam Prudy Feeler) 0.5 MG tablet Take 0.5 mg by mouth 3 (three) times daily as needed. For anxiety   Yes Historical Provider, MD  fentaNYL (DURAGESIC - DOSED MCG/HR) 25 MCG/HR Place 1 patch (25 mcg total) onto the skin every 3 (three) days. 07/25/12  Yes Penny Pia, MD  levothyroxine (SYNTHROID, LEVOTHROID) 25 MCG tablet Take 25 mcg by mouth daily.   Yes Historical Provider, MD  Multiple Vitamin (MULTIVITAMIN) tablet Take 1 tablet by mouth daily.     Yes Historical Provider, MD  nystatin cream (MYCOSTATIN) Apply 1 application topically 2 (two) times daily as needed. For skin irritation   Yes Historical Provider, MD  oxycodone (OXY-IR) 5 MG capsule Take 5 mg by mouth every 6 (six) hours as needed. pain   Yes Historical Provider, MD  pediatric multivitamin + iron (POLY-VI-SOL +IRON) 10 MG/ML oral solution Take 1 mL  by mouth 2 (two) times daily.   Yes Historical Provider, MD  vitamin B-12 (CYANOCOBALAMIN) 1000 MCG tablet Take 1,000 mcg by mouth daily.   Yes Historical Provider, MD  warfarin (COUMADIN) 1 MG tablet Take 1-3 mg by mouth daily. Take 1mg  on Mon, Wed, Fri, Sat, Sun. And 3mg  rest of days 07/25/12  Yes Penny Pia, MD   Physical Exam: Filed Vitals:   08/04/12 1535 08/04/12 1953 08/04/12 2230 08/04/12 2300  BP: 177/88 183/78 182/90 152/55  Pulse: 90 78 82 79  Temp: 98.1 F (36.7 C) 98.8 F (37.1 C)    TempSrc: Oral Oral    Resp: 22 17 16 16   SpO2: 95% 97% 96% 97%    General:  NAD, resting comfortably in bed Eyes: PEERLA EOMI ENT:  mucous membranes moist Neck: supple w/o JVD Cardiovascular: RRR w/o MRG Respiratory: CTA B Abdomen: soft, nt, nd, bs+ Skin: old skin changes from healing cellulitis on LLE Musculoskeletal: MAE, full ROM all 4 extremities, 2+ pitting edema RLE, 4+ pitting edema LLE Psychiatric: normal tone and affect Neurologic: AAOx3, grossly non-focal  Labs on Admission:  Basic Metabolic Panel:  Lab 08/04/12 1610  NA 140  K 3.5  CL 105  CO2 24  GLUCOSE 112*  BUN 9  CREATININE 1.72*  CALCIUM 8.5  MG --  PHOS --   Liver Function Tests:  Lab 08/04/12 1539  AST 28  ALT 15  ALKPHOS 88  BILITOT 0.5  PROT 6.3  ALBUMIN 3.2*   No results found for this basename: LIPASE:5,AMYLASE:5 in the last 168 hours No results found for this basename: AMMONIA:5 in the last 168 hours CBC:  Lab 08/04/12 1539  WBC 6.8  NEUTROABS --  HGB 10.0*  HCT 31.7*  MCV 85.0  PLT 125*   Cardiac Enzymes: No results found for this basename: CKTOTAL:5,CKMB:5,CKMBINDEX:5,TROPONINI:5 in the last 168 hours  BNP (last 3 results)  Basename 08/04/12 1539  PROBNP 8892.0*   CBG: No results found for this basename: GLUCAP:5 in the last 168 hours  Radiological Exams on Admission: Ct Abdomen Pelvis Wo Contrast  08/04/2012  *RADIOLOGY REPORT*  Clinical Data: Bilateral leg swelling.  Abdominal pain.  Chronic renal insufficiency.  CT ABDOMEN AND PELVIS WITHOUT CONTRAST  Technique:  Multidetector CT imaging of the abdomen and pelvis was performed following the standard protocol without intravenous contrast.  Comparison: 07/07/2012. [facility]  Findings: Lung Bases: Small bilateral pleural effusion.  Bilateral dependent atelectasis in the lungs.  Low attenuation intravascular compartment suggesting anemia.  Liver:  Unenhanced CT was performed per clinician order.  Lack of IV contrast limits sensitivity and specificity, especially for evaluation of abdominal/pelvic solid viscera.  Spleen:  Unchanged splenomegaly.   Surgical clips near the splenic hilum.  Gallbladder:  Surgically absent.  Common bile duct:  Normal.  Pancreas:  Fatty atrophy.  Adrenal glands:  Normal.  Kidneys:  Mild renal atrophy.  Mild ectasia of the right ureter could be due to ascending urinary tract infection or peristaltic contraction at the time of imaging.  No calculi.  Stomach:  Postoperative changes at the gastroesophageal junction. Gastric bypass.  Small bowel:  Small volume of ascites is present.  No bowel obstruction.  Contrast extends into the colon.  Colon:   Mural thickening of the ascending and transverse colon is present.  This is compatible with colitis although nonspecific in terms of etiology.  Descending colon and sigmoid colon appear normal.  Rectum normal.  Pelvic Genitourinary:  Hysterectomy.  Small amount of ascites  in the anatomic pelvis.  Urinary bladder decompressed.  Fatty infiltration of the bladder wall, most commonly associated with chronic inflammatory changes of the bladder.  Bones:  Osteopenia.  Diffuse muscular atrophy.  Lumbar spinal fixation.  Vasculature: Mild atherosclerosis.  IVC filter.  Retroperitoneal lymphadenopathy appears similar to the prior exam of 07/07/2012.  This is most prominent in the left retroperitoneum and extending along the left common iliac and external iliac vessels.  No right pelvic sidewall adenopathy is present.  IMPRESSION: 1.  Small bilateral pleural effusions with associated atelectasis. 2.  Low attenuation of the intravascular compartment suggesting anemia. 3.  Ascending and transverse colitis with mural thickening. Differential considerations include infection, inflammatory bowel disease and ischemia.  This includes C difficile colitis. 4.  Uncomplicated postoperative changes of gastric bypass. 5.  Mild ectasia of the right ureter which can be associated with ascending urinary tract infection or due to peristaltic contraction; correlation with urinalysis recommended. 6.  Cholecystectomy. IVC  filter. Hysterectomy. 7.  No interval change and retroperitoneal and left iliac adenopathy.  The cause is unknown.  Hematalogic malignancy cannot be excluded. 8.  Small amount of ascites.   Original Report Authenticated By: Andreas Newport, M.D.    Dg Chest 2 View  08/04/2012  *RADIOLOGY REPORT*  Clinical Data: Shortness of breath and leg swelling.  CHEST - 2 VIEW  Comparison: Chest radiograph 07/31/2012 and chest CT 11/15/2010  Findings: Mild cardiomegaly appears stable.  Mediastinal and hilar contours are within normal limits.  There is a small focal opacity at the posterolateral left lung base.  The right lung is clear. The trachea is midline.  Surgical clips are seen in the left upper quadrant.  IMPRESSION: 1.  Small focal opacity in the left lower lobe is favored to be due to atelectasis.  Early / mild airspace disease cannot be completely excluded. 2.  Mild cardiomegaly without failure.   Original Report Authenticated By: Britta Mccreedy, M.D.     EKG: Independently reviewed.  Assessment/Plan Principal Problem:  *Colitis Active Problems:  Renal insufficiency  Peripheral edema   1. Colitis - suspicious for C. Diff given smell by nursing report as well as recent broad spectrum ABx followed by clindamycin.  Will treat empirically with PO flagyl while awaiting C.Diff PCR results. 2. Peripheral edema - with LLE swollen more than R, does not appear to be recurrence of cellulitis at this point, I suspect that this is due to renal insufficiency over the past couple of weeks, creatinine is 1.7 today, unclear if this will be her new baseline, may end up needing to be on chronic lasix but will keep patient euvolemic given the new onset diarrhea.  Doubt DVT though will check, patient is on chronic coumadin with INR of 2, also has IVC filter in place.    Code Status: Full Code (must indicate code status--if unknown or must be presumed, indicate so) Family Communication: spoke with husband at bedside  (indicate person spoken with, if applicable, with phone number if by telephone) Disposition Plan: admit to obs (indicate anticipated LOS)  Time spent: 70 min  GARDNER, JARED M. [facility] Pager [phone]  If 7PM-7AM, please contact night-coverage [email or web address] Password [email or web address] 08/04/2012, 11:54 PM

## 2012-08-04 NOTE — ED Notes (Signed)
Small amount of blood noted in stool

## 2012-08-04 NOTE — ED Notes (Signed)
Pt is here bilateral leg swelling from thigh down to feet, pt states it use to just be in her left leg, but now it has developed in her right leg.  Pt is pale.  Pt has been having shortness of breath with some off and on chest tightness.  PT is here with nausea and diarrhea and lower abdominal pain Sunday and has been having vomiting.  Pt was told that she was anemic and placed on Vit b12

## 2012-08-05 DIAGNOSIS — R609 Edema, unspecified: Secondary | ICD-10-CM | POA: Diagnosis present

## 2012-08-05 DIAGNOSIS — K529 Noninfective gastroenteritis and colitis, unspecified: Secondary | ICD-10-CM | POA: Diagnosis present

## 2012-08-05 DIAGNOSIS — R600 Localized edema: Secondary | ICD-10-CM | POA: Diagnosis present

## 2012-08-05 DIAGNOSIS — N289 Disorder of kidney and ureter, unspecified: Secondary | ICD-10-CM | POA: Diagnosis present

## 2012-08-05 DIAGNOSIS — I82409 Acute embolism and thrombosis of unspecified deep veins of unspecified lower extremity: Secondary | ICD-10-CM

## 2012-08-05 LAB — CBC
HCT: 29.5 % — ABNORMAL LOW (ref 36.0–46.0)
Hemoglobin: 9.3 g/dL — ABNORMAL LOW (ref 12.0–15.0)
MCHC: 31.5 g/dL (ref 30.0–36.0)

## 2012-08-05 LAB — BASIC METABOLIC PANEL
BUN: 9 mg/dL (ref 6–23)
Chloride: 105 mEq/L (ref 96–112)
GFR calc non Af Amer: 31 mL/min — ABNORMAL LOW (ref >90)
Glucose, Bld: 96 mg/dL (ref 70–99)
Potassium: 3.3 mEq/L — ABNORMAL LOW (ref 3.5–5.1)

## 2012-08-05 LAB — URINALYSIS, ROUTINE W REFLEX MICROSCOPIC
Bilirubin Urine: NEGATIVE
Glucose, UA: NEGATIVE mg/dL
Ketones, ur: 15 mg/dL — AB
Nitrite: NEGATIVE
Protein, ur: NEGATIVE mg/dL
Specific Gravity, Urine: 1.01 (ref 1.005–1.030)
Urobilinogen, UA: 0.2 mg/dL (ref 0.0–1.0)
pH: 7 (ref 5.0–8.0)

## 2012-08-05 LAB — URINE MICROSCOPIC-ADD ON

## 2012-08-05 LAB — TROPONIN I: Troponin I: <0.3 ng/mL (ref <0.30)

## 2012-08-05 MED ORDER — ONDANSETRON HCL 4 MG/2ML IJ SOLN
4.0000 mg | Freq: Four times a day (QID) | INTRAMUSCULAR | Status: DC | PRN
  Administered 2012-08-05 – 2012-08-07 (×6): 4 mg via INTRAVENOUS
  Filled 2012-08-05 (×5): qty 2

## 2012-08-05 MED ORDER — LEVALBUTEROL HCL 1.25 MG/0.5ML IN NEBU
1.2500 mg | INHALATION_SOLUTION | Freq: Three times a day (TID) | RESPIRATORY_TRACT | Status: DC
  Administered 2012-08-06 (×2): 1.25 mg via RESPIRATORY_TRACT
  Filled 2012-08-05 (×11): qty 0.5

## 2012-08-05 MED ORDER — FENTANYL 25 MCG/HR TD PT72
25.0000 ug | MEDICATED_PATCH | TRANSDERMAL | Status: DC
  Administered 2012-08-05 – 2012-08-08 (×2): 25 ug via TRANSDERMAL
  Filled 2012-08-05 (×2): qty 1

## 2012-08-05 MED ORDER — POTASSIUM CHLORIDE IN NACL 20-0.9 MEQ/L-% IV SOLN
INTRAVENOUS | Status: DC
  Administered 2012-08-05 – 2012-08-07 (×3): via INTRAVENOUS
  Filled 2012-08-05 (×6): qty 1000

## 2012-08-05 MED ORDER — WARFARIN SODIUM 2 MG PO TABS
2.0000 mg | ORAL_TABLET | Freq: Once | ORAL | Status: DC
  Filled 2012-08-05: qty 1

## 2012-08-05 MED ORDER — WARFARIN SODIUM 3 MG PO TABS
3.0000 mg | ORAL_TABLET | Freq: Once | ORAL | Status: AC
  Administered 2012-08-05: 3 mg via ORAL
  Filled 2012-08-05: qty 1

## 2012-08-05 MED ORDER — ADULT MULTIVITAMIN W/MINERALS CH
1.0000 | ORAL_TABLET | Freq: Every day | ORAL | Status: DC
  Administered 2012-08-05 – 2012-08-08 (×4): 1 via ORAL
  Filled 2012-08-05 (×4): qty 1

## 2012-08-05 MED ORDER — OXYCODONE HCL 5 MG PO TABS
5.0000 mg | ORAL_TABLET | Freq: Four times a day (QID) | ORAL | Status: DC | PRN
  Administered 2012-08-05 – 2012-08-07 (×5): 5 mg via ORAL
  Filled 2012-08-05 (×6): qty 1

## 2012-08-05 NOTE — Progress Notes (Signed)
Patient transferred to 6N15 for cardiac monitoring.  Telemetry box 6N15 placed on patient.  Husband aware of transfer and cardiac monitoring.  Will continue to monitor.

## 2012-08-05 NOTE — Progress Notes (Addendum)
ANTICOAGULATION CONSULT NOTE - F/U Consult  Pharmacy Consult for Coumadin Indication: h/o PE/DVT  Allergies  Allergen Reactions  . Bee Venom   . Codeine Palpitations    Per patient, "Feels like I'm having a heart attack."    Vital Signs: Temp: 98.5 F (36.9 C) (12/25 0526) Temp src: Oral (12/25 0526) BP: 143/74 mmHg (12/25 0526) Pulse Rate: 83  (12/25 0526)  Labs:  Basename 08/05/12 0547 08/04/12 1539  HGB 9.3* 10.0*  HCT 29.5* 31.7*  PLT 101* 125*  APTT -- --  LABPROT 19.7* 22.1*  INR 1.73* 2.03*  HEPARINUNFRC -- --  CREATININE 1.64* 1.72*  CKTOTAL -- --  CKMB -- --  TROPONINI -- --    Medical History: Past Medical History  Diagnosis Date  . Other chest pain   . Other chronic pain   . Esophageal reflux   . Unspecified essential hypertension   . Personal history of pulmonary embolism   . Myalgia and myositis, unspecified   . Coronary atherosclerosis of native coronary artery     CATHETERIZATION X 3  . Dysthymic disorder   . Morbid obesity   . Type II or unspecified type diabetes mellitus without mention of complication, not stated as uncontrolled   . Fibromyalgia   . GERD (gastroesophageal reflux disease)   . Anxiety   . Myocardial infarction    Assessment: 71yo female to continue Coumadin for h/o PE/DVT during admission for colitis.Home dose of Coumadin is 1mg  M/W/F/Sat/Sun and 3mg  Tues/Thurs. INR Upon admission was 2.03. INR subtherapeutic at 1.73 (goal 2.0-3.0) after a 3mg  dose administered early this morning (midnight). H/H and plts low but stable; of note patient admitted with bloody diarrhea due to colitis. Patient is also on Flagyl which may potentially increase INR.    Plan: - Coumadin 3mg  x 1 dose - Daily INR - Monitor for signs and symptoms of bleeding   Thank you, Franchot Erichsen, Pharm.D. Clinical Pharmacist   08/05/2012 10:04 AM

## 2012-08-05 NOTE — Progress Notes (Signed)
[facility] PROGRESS NOTE  [name] [name]:[id number] DOB: 1941-05-21 DOA: 08/04/2012 PCP: Louie Boston, MD  Assessment/Plan: Principal Problem:  *Colitis Active Problems:  Renal insufficiency  Peripheral edema     1. Colitis continue empiric Flagyl, possible C. difficile 2. Reason cellulitis called off on antibiotics, doubt DVT, has an IVC filter, on Coumadin to be dosed by the pharmacy 3. Recent history of acute kidney injury, creatinine trending down, still not at baseline, continue IV fluids 4. Hypothyroidism continue Synthroid 5. Right leg swelling Will repeat Doppler,  Code Status: full Family Communication: family updated about patient's clinical progress Disposition Plan:  As above    Brief narrative: [name] [name] is a 71 y.o. female who presents with 2-3 day onset crampy quality abdominal pain located diffusely through her abdomen and bloody diarrhea. Nothing makes symptoms better nor worse. These symptoms occur in the context of a recent admission for AKF, cellulitis of the LLE treated with Zosyn, IV Vanc, Merrem, and finally 14 days of clindamycin.  In the ED her creatinine was noted to be significantly improved to 1.7 (was 4 at time of discharge from last admit during AKF). CT scan of her abdomen demonstrated transverse and descending colitis. Hospitalist has been asked to admit.   Consultants:  None  Procedures:  None  Antibiotics:  Flagyl  HPI/Subjective: Denies any shortness of breath today  Objective: Filed Vitals:   08/04/12 2230 08/04/12 2300 08/05/12 0047 08/05/12 0526  BP: 182/90 152/55 164/70 143/74  Pulse: 82 79 73 83  Temp:   98.8 F (37.1 C) 98.5 F (36.9 C)  TempSrc:   Oral Oral  Resp: 16 16 17 18   Height:   5\' 7"  (1.702 m)   Weight:   127 kg (279 lb 15.8 oz)   SpO2: 96% 97% 97% 97%   No intake or output data in the 24 hours ending 08/05/12 0855  Exam:  General: NAD, resting comfortably in bed  Eyes: PEERLA EOMI   ENT: mucous membranes moist  Neck: supple w/o JVD  Cardiovascular: RRR w/o MRG  Respiratory: CTA B  Abdomen: soft, nt, nd, bs+  Skin: old skin changes from healing cellulitis on LLE  Musculoskeletal: MAE, full ROM all 4 extremities, 2+ pitting edema RLE, 4+ pitting edema LLE  Psychiatric: normal tone and affect  Neurologic: AAOx3, grossly non-focal   Data Reviewed: Basic Metabolic Panel:  Lab 08/05/12 9562 08/04/12 1539  NA 137 140  K 3.3* 3.5  CL 105 105  CO2 23 24  GLUCOSE 96 112*  BUN 9 9  CREATININE 1.64* 1.72*  CALCIUM 8.0* 8.5  MG -- --  PHOS -- --    Liver Function Tests:  Lab 08/04/12 1539  AST 28  ALT 15  ALKPHOS 88  BILITOT 0.5  PROT 6.3  ALBUMIN 3.2*   No results found for this basename: LIPASE:5,AMYLASE:5 in the last 168 hours No results found for this basename: AMMONIA:5 in the last 168 hours  CBC:  Lab 08/05/12 0547 08/04/12 1539  WBC 5.2 6.8  NEUTROABS -- --  HGB 9.3* 10.0*  HCT 29.5* 31.7*  MCV 85.5 85.0  PLT 101* 125*    Cardiac Enzymes: No results found for this basename: CKTOTAL:5,CKMB:5,CKMBINDEX:5,TROPONINI:5 in the last 168 hours BNP (last 3 results)  Basename 08/04/12 1539  PROBNP 8892.0*     CBG: No results found for this basename: GLUCAP:5 in the last 168 hours  No results found for this or any previous visit (from the past 240  hour(s)).   Studies: Ct Abdomen Pelvis Wo Contrast  08/04/2012  *RADIOLOGY REPORT*  Clinical Data: Bilateral leg swelling.  Abdominal pain.  Chronic renal insufficiency.  CT ABDOMEN AND PELVIS WITHOUT CONTRAST  Technique:  Multidetector CT imaging of the abdomen and pelvis was performed following the standard protocol without intravenous contrast.  Comparison: 07/07/2012. [facility] hospital  Findings: Lung Bases: Small bilateral pleural effusion.  Bilateral dependent atelectasis in the lungs.  Low attenuation intravascular compartment suggesting anemia.  Liver:  Unenhanced CT was performed per  clinician order.  Lack of IV contrast limits sensitivity and specificity, especially for evaluation of abdominal/pelvic solid viscera.  Spleen:  Unchanged splenomegaly.  Surgical clips near the splenic hilum.  Gallbladder:  Surgically absent.  Common bile duct:  Normal.  Pancreas:  Fatty atrophy.  Adrenal glands:  Normal.  Kidneys:  Mild renal atrophy.  Mild ectasia of the right ureter could be due to ascending urinary tract infection or peristaltic contraction at the time of imaging.  No calculi.  Stomach:  Postoperative changes at the gastroesophageal junction. Gastric bypass.  Small bowel:  Small volume of ascites is present.  No bowel obstruction.  Contrast extends into the colon.  Colon:   Mural thickening of the ascending and transverse colon is present.  This is compatible with colitis although nonspecific in terms of etiology.  Descending colon and sigmoid colon appear normal.  Rectum normal.  Pelvic Genitourinary:  Hysterectomy.  Small amount of ascites in the anatomic pelvis.  Urinary bladder decompressed.  Fatty infiltration of the bladder wall, most commonly associated with chronic inflammatory changes of the bladder.  Bones:  Osteopenia.  Diffuse muscular atrophy.  Lumbar spinal fixation.  Vasculature: Mild atherosclerosis.  IVC filter.  Retroperitoneal lymphadenopathy appears similar to the prior exam of 07/07/2012.  This is most prominent in the left retroperitoneum and extending along the left common iliac and external iliac vessels.  No right pelvic sidewall adenopathy is present.  IMPRESSION: 1.  Small bilateral pleural effusions with associated atelectasis. 2.  Low attenuation of the intravascular compartment suggesting anemia. 3.  Ascending and transverse colitis with mural thickening. Differential considerations include infection, inflammatory bowel disease and ischemia.  This includes C difficile colitis. 4.  Uncomplicated postoperative changes of gastric bypass. 5.  Mild ectasia of the right  ureter which can be associated with ascending urinary tract infection or due to peristaltic contraction; correlation with urinalysis recommended. 6.  Cholecystectomy. IVC filter. Hysterectomy. 7.  No interval change and retroperitoneal and left iliac adenopathy.  The cause is unknown.  Hematalogic malignancy cannot be excluded. 8.  Small amount of ascites.   Original Report Authenticated By: Andreas Newport, M.D.    Dg Chest 2 View  08/04/2012  *RADIOLOGY REPORT*  Clinical Data: Shortness of breath and leg swelling.  CHEST - 2 VIEW  Comparison: Chest radiograph 07/31/2012 and chest CT 11/15/2010  Findings: Mild cardiomegaly appears stable.  Mediastinal and hilar contours are within normal limits.  There is a small focal opacity at the posterolateral left lung base.  The right lung is clear. The trachea is midline.  Surgical clips are seen in the left upper quadrant.  IMPRESSION: 1.  Small focal opacity in the left lower lobe is favored to be due to atelectasis.  Early / mild airspace disease cannot be completely excluded. 2.  Mild cardiomegaly without failure.   Original Report Authenticated By: Britta Mccreedy, M.D.    US Renal  07/16/2012  *RADIOLOGY REPORT*  Clinical Data: Renal failure.  RENAL/URINARY TRACT ULTRASOUND COMPLETE  Comparison:  07/13/2012.  Findings:  Right Kidney:  11.7 cm. Normal echotexture.  Normal central sinus echo complex.  No calculi or hydronephrosis.  Left Kidney:  13.0 cm. Normal echotexture.  Normal central sinus echo complex.  No calculi or hydronephrosis. Inferior pole is difficult to visualize due to patient body habitus.  Bladder:  Foley catheter.  Urinary bladder decompressed.  The exam is technically degraded due to obese body habitus.  IMPRESSION:  1.  Normal appearance of the kidneys. 2.  Suboptimal exam due to obese body habitus. 3.  Decompressed urinary bladder with Foley catheter.   Original Report Authenticated By: Andreas Newport, M.D.    Dg Chest Port 1  View  07/19/2012  *RADIOLOGY REPORT*  Clinical Data: Questionable edema.  Acute renal failure.  Fever and weakness.  PORTABLE CHEST - 1 VIEW  Comparison: 07/07/2012  Findings: The cardiac silhouette is normal in size and configuration.  No mediastinal or hilar mass or adenopathy.  Minor linear opacity in the left lateral lower lung is likely subsegmental atelectasis or scarring.  The lungs otherwise clear. No pleural effusion or pneumothorax.  The bony thorax is demineralized.  IMPRESSION: No acute cardiopulmonary disease.   Original Report Authenticated By: Amie Portland, M.D.     Scheduled Meds:   . fentaNYL  25 mcg Transdermal Q72H  . levothyroxine  25 mcg Oral QAC breakfast  . metroNIDAZOLE  500 mg Oral Q8H  . multivitamin with minerals  1 tablet Oral Daily  . vitamin B-12  1,000 mcg Oral Daily  . Warfarin - Pharmacist Dosing Inpatient   Does not apply q1800   Continuous Infusions:   Principal Problem:  *Colitis Active Problems:  Renal insufficiency  Peripheral edema    Time spent: 40 minutes   [facility]  [facility] Pager [phone]. If 8PM-8AM, please contact night-coverage at [email or web address], password [facility] 08/05/2012, 8:55 AM  LOS: 1 day

## 2012-08-05 NOTE — Progress Notes (Signed)
VASCULAR LAB PRELIMINARY  PRELIMINARY  PRELIMINARY  PRELIMINARY  Bilateral lower extremity venous duplex completed.    Preliminary report: Sub optimal and technically difficult exam.  Unable to visualize the distal femoral veins, bilateral peroneal veins, and the left PT vein.  No obvious deep or superficial vein thrombosis noted in the visualized veins bilaterally. Makaria Poarch, RVT 08/05/2012, 10:59 AM

## 2012-08-06 LAB — BASIC METABOLIC PANEL
BUN: 8 mg/dL (ref 6–23)
Calcium: 8 mg/dL — ABNORMAL LOW (ref 8.4–10.5)
GFR calc Af Amer: 41 mL/min — ABNORMAL LOW (ref >90)
GFR calc non Af Amer: 35 mL/min — ABNORMAL LOW (ref >90)
Glucose, Bld: 183 mg/dL — ABNORMAL HIGH (ref 70–99)
Potassium: 3.2 mEq/L — ABNORMAL LOW (ref 3.5–5.1)
Sodium: 137 mEq/L (ref 135–145)

## 2012-08-06 LAB — GLUCOSE, CAPILLARY: Glucose-Capillary: 107 mg/dL — ABNORMAL HIGH (ref 70–99)

## 2012-08-06 LAB — PROTIME-INR: INR: 2.18 — ABNORMAL HIGH (ref 0.00–1.49)

## 2012-08-06 LAB — CLOSTRIDIUM DIFFICILE BY PCR: Toxigenic C. Difficile by PCR: NEGATIVE

## 2012-08-06 MED ORDER — SACCHAROMYCES BOULARDII 250 MG PO CAPS
250.0000 mg | ORAL_CAPSULE | Freq: Two times a day (BID) | ORAL | Status: DC
  Administered 2012-08-06 – 2012-08-08 (×5): 250 mg via ORAL
  Filled 2012-08-06 (×6): qty 1

## 2012-08-06 MED ORDER — HYDRALAZINE HCL 20 MG/ML IJ SOLN
5.0000 mg | Freq: Once | INTRAMUSCULAR | Status: AC
  Administered 2012-08-06: 5 mg via INTRAVENOUS
  Filled 2012-08-06: qty 0.25

## 2012-08-06 MED ORDER — WARFARIN SODIUM 1 MG PO TABS
1.0000 mg | ORAL_TABLET | Freq: Once | ORAL | Status: AC
  Administered 2012-08-06: 1 mg via ORAL
  Filled 2012-08-06: qty 1

## 2012-08-06 NOTE — Progress Notes (Signed)
ANTICOAGULATION CONSULT NOTE - F/U Consult  Pharmacy Consult for Coumadin Indication: h/o PE/DVT  Allergies  Allergen Reactions  . Bee Venom   . Codeine Palpitations    Per patient, "Feels like I'm having a heart attack."    Vital Signs: Temp: 98 F (36.7 C) (12/26 0525) Temp src: Oral (12/26 0525) BP: 160/73 mmHg (12/26 0746) Pulse Rate: 90  (12/26 0746)  Labs:  Basename 08/06/12 0500 08/05/12 0908 08/05/12 0547 08/04/12 1539  HGB -- -- 9.3* 10.0*  HCT -- -- 29.5* 31.7*  PLT -- -- 101* 125*  APTT -- -- -- --  LABPROT 23.3* -- 19.7* 22.1*  INR 2.18* -- 1.73* 2.03*  HEPARINUNFRC -- -- -- --  CREATININE -- -- 1.64* 1.72*  CKTOTAL -- -- -- --  CKMB -- -- -- --  TROPONINI -- <0.30 -- --   Assessment: 71yo female to continue Coumadin for h/o PE/DVT during admission for colitis.Home dose of Coumadin is 1mg  M/W/F/Sat/Sun and 3mg  Tues/Thurs. INR upon admission was 2.03. INR therapeutic at 2.18. Patient also on Flagyl which may potentially increase INR.    Plan: - Coumadin 1mg  x 1 dose - Daily INR - Monitor for signs and symptoms of bleeding   Thank you, Christoper Fabian, PharmD, BCPS Clinical pharmacist, pager [phone]   08/06/2012 10:23 AM

## 2012-08-06 NOTE — Progress Notes (Signed)
Patient has very limited venous access.  On second attempt was able to cannulate a small vein in her right hand but was unable to advance it completely.  If patient requires further IV therapy recommend Midline or PICC placement

## 2012-08-06 NOTE — Progress Notes (Signed)
[facility] PROGRESS NOTE  [name] [name] UJW:[id number] DOB: 1941-04-04 DOA: 08/04/2012 PCP: Louie Boston, MD  Assessment/Plan: Principal Problem:  *Colitis Active Problems:  Renal insufficiency  Peripheral edema    1. Colitis continue empiric Flagyl, possible C. difficile, PCR pending 2. Reason cellulitis called off on antibiotics, doubt DVT, has an IVC filter, on Coumadin to be dosed by the pharmacy 3. Recent history of acute kidney injury, creatinine trending down, still not at baseline, continue IV fluids for gentle hydration 4. Hypothyroidism continue Synthroid 5. Right leg swelling Doppler negative, check BNP Code Status: full  Family Communication: family updated about patient's clinical progress  Disposition Plan: DC home tomorrow diarrhea is improved  Brief narrative:  [name] [name] is a 71 y.o. female who presents with 2-3 day onset crampy quality abdominal pain located diffusely through her abdomen and bloody diarrhea. Nothing makes symptoms better nor worse. These symptoms occur in the context of a recent admission for AKF, cellulitis of the LLE treated with Zosyn, IV Vanc, Merrem, and finally 14 days of clindamycin.  In the ED her creatinine was noted to be significantly improved to 1.7 (was 4 at time of discharge from last admit during AKF). CT scan of her abdomen demonstrated transverse and descending colitis. Hospitalist has been asked to admit.  Consultants:  None Procedures:  None Antibiotics:  Flagyl HPI/Subjective:  Denies any shortness of breath today   Objective: Filed Vitals:   08/06/12 0525 08/06/12 0654 08/06/12 0746 08/06/12 0831  BP: 170/76 182/90 160/73   Pulse: 83  90   Temp: 98 F (36.7 C)     TempSrc: Oral     Resp: 18     Height:      Weight:      SpO2: 97%  99% 98%    Intake/Output Summary (Last 24 hours) at 08/06/12 0903 Last data filed at 08/05/12 2200  Gross per 24 hour  Intake    400 ml  Output    200 ml  Net     200 ml    Exam:  HENT:  Head: Atraumatic.  Nose: Nose normal.  Mouth/Throat: Oropharynx is clear and moist.  Eyes: Conjunctivae are normal. Pupils are equal, round, and reactive to light. No scleral icterus.  Neck: Neck supple. No tracheal deviation present.  Cardiovascular: Normal rate, regular rhythm, normal heart sounds and intact distal pulses.  Pulmonary/Chest: Effort normal and breath sounds normal. No respiratory distress.  Abdominal: Soft. Normal appearance and bowel sounds are normal. She exhibits no distension. There is no tenderness.  Musculoskeletal: She exhibits no edema and no tenderness.  Neurological: She is alert. No cranial nerve deficit.    Data Reviewed: Basic Metabolic Panel:  Lab 08/05/12 5621 08/04/12 1539  NA 137 140  K 3.3* 3.5  CL 105 105  CO2 23 24  GLUCOSE 96 112*  BUN 9 9  CREATININE 1.64* 1.72*  CALCIUM 8.0* 8.5  MG -- --  PHOS -- --    Liver Function Tests:  Lab 08/04/12 1539  AST 28  ALT 15  ALKPHOS 88  BILITOT 0.5  PROT 6.3  ALBUMIN 3.2*   No results found for this basename: LIPASE:5,AMYLASE:5 in the last 168 hours No results found for this basename: AMMONIA:5 in the last 168 hours  CBC:  Lab 08/05/12 0547 08/04/12 1539  WBC 5.2 6.8  NEUTROABS -- --  HGB 9.3* 10.0*  HCT 29.5* 31.7*  MCV 85.5 85.0  PLT 101* 125*    Cardiac Enzymes:  Lab 08/05/12 0908  CKTOTAL --  CKMB --  CKMBINDEX --  TROPONINI <0.30   BNP (last 3 results)  Basename 08/04/12 1539  PROBNP 8892.0*     CBG:  Lab 08/06/12 0758  GLUCAP 107*    No results found for this or any previous visit (from the past 240 hour(s)).   Studies: Ct Abdomen Pelvis Wo Contrast  08/04/2012  *RADIOLOGY REPORT*  Clinical Data: Bilateral leg swelling.  Abdominal pain.  Chronic renal insufficiency.  CT ABDOMEN AND PELVIS WITHOUT CONTRAST  Technique:  Multidetector CT imaging of the abdomen and pelvis was performed following the standard protocol without  intravenous contrast.  Comparison: 07/07/2012. [facility] hospital  Findings: Lung Bases: Small bilateral pleural effusion.  Bilateral dependent atelectasis in the lungs.  Low attenuation intravascular compartment suggesting anemia.  Liver:  Unenhanced CT was performed per clinician order.  Lack of IV contrast limits sensitivity and specificity, especially for evaluation of abdominal/pelvic solid viscera.  Spleen:  Unchanged splenomegaly.  Surgical clips near the splenic hilum.  Gallbladder:  Surgically absent.  Common bile duct:  Normal.  Pancreas:  Fatty atrophy.  Adrenal glands:  Normal.  Kidneys:  Mild renal atrophy.  Mild ectasia of the right ureter could be due to ascending urinary tract infection or peristaltic contraction at the time of imaging.  No calculi.  Stomach:  Postoperative changes at the gastroesophageal junction. Gastric bypass.  Small bowel:  Small volume of ascites is present.  No bowel obstruction.  Contrast extends into the colon.  Colon:   Mural thickening of the ascending and transverse colon is present.  This is compatible with colitis although nonspecific in terms of etiology.  Descending colon and sigmoid colon appear normal.  Rectum normal.  Pelvic Genitourinary:  Hysterectomy.  Small amount of ascites in the anatomic pelvis.  Urinary bladder decompressed.  Fatty infiltration of the bladder wall, most commonly associated with chronic inflammatory changes of the bladder.  Bones:  Osteopenia.  Diffuse muscular atrophy.  Lumbar spinal fixation.  Vasculature: Mild atherosclerosis.  IVC filter.  Retroperitoneal lymphadenopathy appears similar to the prior exam of 07/07/2012.  This is most prominent in the left retroperitoneum and extending along the left common iliac and external iliac vessels.  No right pelvic sidewall adenopathy is present.  IMPRESSION: 1.  Small bilateral pleural effusions with associated atelectasis. 2.  Low attenuation of the intravascular compartment suggesting anemia. 3.   Ascending and transverse colitis with mural thickening. Differential considerations include infection, inflammatory bowel disease and ischemia.  This includes C difficile colitis. 4.  Uncomplicated postoperative changes of gastric bypass. 5.  Mild ectasia of the right ureter which can be associated with ascending urinary tract infection or due to peristaltic contraction; correlation with urinalysis recommended. 6.  Cholecystectomy. IVC filter. Hysterectomy. 7.  No interval change and retroperitoneal and left iliac adenopathy.  The cause is unknown.  Hematalogic malignancy cannot be excluded. 8.  Small amount of ascites.   Original Report Authenticated By: Andreas Newport, M.D.    Dg Chest 2 View  08/04/2012  *RADIOLOGY REPORT*  Clinical Data: Shortness of breath and leg swelling.  CHEST - 2 VIEW  Comparison: Chest radiograph 07/31/2012 and chest CT 11/15/2010  Findings: Mild cardiomegaly appears stable.  Mediastinal and hilar contours are within normal limits.  There is a small focal opacity at the posterolateral left lung base.  The right lung is clear. The trachea is midline.  Surgical clips are seen in the left upper quadrant.  IMPRESSION: 1.  Small focal opacity in the left lower lobe is favored to be due to atelectasis.  Early / mild airspace disease cannot be completely excluded. 2.  Mild cardiomegaly without failure.   Original Report Authenticated By: Britta Mccreedy, M.D.    US Renal  07/16/2012  *RADIOLOGY REPORT*  Clinical Data: Renal failure.  RENAL/URINARY TRACT ULTRASOUND COMPLETE  Comparison:  07/13/2012.  Findings:  Right Kidney:  11.7 cm. Normal echotexture.  Normal central sinus echo complex.  No calculi or hydronephrosis.  Left Kidney:  13.0 cm. Normal echotexture.  Normal central sinus echo complex.  No calculi or hydronephrosis. Inferior pole is difficult to visualize due to patient body habitus.  Bladder:  Foley catheter.  Urinary bladder decompressed.  The exam is technically degraded due  to obese body habitus.  IMPRESSION:  1.  Normal appearance of the kidneys. 2.  Suboptimal exam due to obese body habitus. 3.  Decompressed urinary bladder with Foley catheter.   Original Report Authenticated By: Andreas Newport, M.D.    Dg Chest Port 1 View  07/19/2012  *RADIOLOGY REPORT*  Clinical Data: Questionable edema.  Acute renal failure.  Fever and weakness.  PORTABLE CHEST - 1 VIEW  Comparison: 07/07/2012  Findings: The cardiac silhouette is normal in size and configuration.  No mediastinal or hilar mass or adenopathy.  Minor linear opacity in the left lateral lower lung is likely subsegmental atelectasis or scarring.  The lungs otherwise clear. No pleural effusion or pneumothorax.  The bony thorax is demineralized.  IMPRESSION: No acute cardiopulmonary disease.   Original Report Authenticated By: Amie Portland, M.D.     Scheduled Meds:   . fentaNYL  25 mcg Transdermal Q72H  . levalbuterol  1.25 mg Nebulization Q8H  . levothyroxine  25 mcg Oral QAC breakfast  . metroNIDAZOLE  500 mg Oral Q8H  . multivitamin with minerals  1 tablet Oral Daily  . vitamin B-12  1,000 mcg Oral Daily  . Warfarin - Pharmacist Dosing Inpatient   Does not apply q1800   Continuous Infusions:   . 0.9 % NaCl with KCl 20 mEq / L 50 mL/hr at 08/05/12 1214    Principal Problem:  *Colitis Active Problems:  Renal insufficiency  Peripheral edema    Time spent: 40 minutes   [facility]  [facility] Pager [phone]. If 8PM-8AM, please contact night-coverage at [email or web address], password [facility] 08/06/2012, 9:03 AM  LOS: 2 days

## 2012-08-07 ENCOUNTER — Telehealth: Payer: Self-pay | Admitting: Cardiology

## 2012-08-07 LAB — BASIC METABOLIC PANEL
Calcium: 8.2 mg/dL — ABNORMAL LOW (ref 8.4–10.5)
Creatinine, Ser: 1.45 mg/dL — ABNORMAL HIGH (ref 0.50–1.10)
GFR calc Af Amer: 41 mL/min — ABNORMAL LOW (ref >90)
GFR calc non Af Amer: 36 mL/min — ABNORMAL LOW (ref >90)

## 2012-08-07 LAB — MAGNESIUM: Magnesium: 1.5 mg/dL (ref 1.5–2.5)

## 2012-08-07 LAB — GLUCOSE, CAPILLARY: Glucose-Capillary: 125 mg/dL — ABNORMAL HIGH (ref 70–99)

## 2012-08-07 LAB — PROTIME-INR: INR: 2.24 — ABNORMAL HIGH (ref 0.00–1.49)

## 2012-08-07 MED ORDER — WARFARIN SODIUM 3 MG PO TABS: 3.0000 mg | ORAL_TABLET | ORAL | Status: DC

## 2012-08-07 MED ORDER — MAGNESIUM OXIDE 400 (241.3 MG) MG PO TABS
400.0000 mg | ORAL_TABLET | Freq: Two times a day (BID) | ORAL | Status: DC
  Administered 2012-08-07 – 2012-08-08 (×3): 400 mg via ORAL
  Filled 2012-08-07 (×4): qty 1

## 2012-08-07 MED ORDER — WARFARIN SODIUM 1 MG PO TABS
1.0000 mg | ORAL_TABLET | ORAL | Status: DC
  Administered 2012-08-07: 1 mg via ORAL
  Filled 2012-08-07 (×2): qty 1

## 2012-08-07 MED ORDER — POTASSIUM CHLORIDE CRYS ER 20 MEQ PO TBCR
40.0000 meq | EXTENDED_RELEASE_TABLET | Freq: Once | ORAL | Status: AC
  Administered 2012-08-07: 40 meq via ORAL
  Filled 2012-08-07: qty 2

## 2012-08-07 MED ORDER — POTASSIUM CHLORIDE IN NACL 40-0.9 MEQ/L-% IV SOLN: INTRAVENOUS | Status: DC

## 2012-08-07 NOTE — Evaluation (Signed)
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  Pt currently admitted for colitis and will benefit from further acute PT services to improve overall mobility.  Pt has HHaid 10-5 M-F and family support 24 hours assistance if needed.  Recommending HHPT at this time.     PT Assessment  Patient needs continued PT services    Follow Up Recommendations  Home health PT;Supervision/Assistance - 24 hour;Supervision for mobility/OOB    Barriers to Discharge None      Equipment Recommendations  None recommended by PT    Recommendations for Other Services  (none)   Frequency Min 3X/week    Precautions / Restrictions Precautions Precautions: Fall   Pertinent Vitals/Pain C/o abdominal pain      Mobility  Bed Mobility Bed Mobility: Not assessed (Pt sitting in recliner when entering room) Transfers Transfers: Sit to Stand;Stand to Sit Sit to Stand: 1: +2 Total assist;From chair/3-in-1 Sit to Stand: Patient Percentage: 60% Stand to Sit: 1: +2 Total assist;To chair/3-in-1 Stand to Sit: Patient Percentage: 70% Details for Transfer Assistance: +2 (A) to initiate transfer with max cues for hand placement.  Pt tends to keep hands on RW during transfers. Ambulation/Gait Ambulation/Gait Assistance: Not tested (comment) (Attempting standing march but pt unable tolerate)    Shoulder Instructions     Exercises General Exercises - Lower Extremity Quad Sets: Strengthening;Both;5 reps Gluteal Sets: Strengthening;5 reps   PT Diagnosis: Difficulty walking;Other (comment)  PT Problem List: Decreased strength;Decreased activity tolerance;Decreased mobility;Impaired sensation PT  Treatment Interventions: DME instruction;Gait training;Functional mobility training;Therapeutic activities;Therapeutic exercise;Patient/family education   PT Goals Acute Rehab PT Goals PT Goal Formulation: With patient/family Time For Goal Achievement: 08/21/12 Potential to Achieve Goals: Good Pt will go Supine/Side to Sit: with modified independence PT Goal: Supine/Side to Sit - Progress: Goal set today Pt will go Sit to Supine/Side: with min assist;with rail PT Goal: Sit to Supine/Side - Progress: Goal set today Pt will go Sit to Stand: with modified independence PT Goal: Sit to Stand - Progress: Goal set today Pt will go Stand to Sit: with modified independence;with upper extremity assist PT Goal: Stand to Sit - Progress: Goal set today Pt will Ambulate: 16 - 50 feet;with +2 total assist;with rolling walker PT Goal: Ambulate - Progress: Goal set today Pt will Perform Home Exercise Program: with supervision, verbal cues required/provided PT Goal: Perform Home Exercise Program - Progress: Goal set today  Visit Information  Last PT Received On: 08/07/12 Assistance Needed: +2    Subjective Data  Subjective: "I'm just weak all over." Patient Stated Goal: To be able to go home and get up and move   Prior Functioning  Home Living Lives With: Spouse Available Help at Discharge: Family;Personal care attendant;Available 24 hours/day Type of Home: Mobile home Home Access: Ramped entrance Home Layout: One level Bathroom Shower/Tub: Walk-in shower;Curtain (per pt wheelchair rolls into shower) Bathroom Toilet: Handicapped height (elevated) Bathroom Accessibility: Yes How Accessible: Accessible via wheelchair;Accessible via walker Home Adaptive Equipment: Hospital bed;Walker - rolling;Wheelchair - manual;Other (comment);Grab bars around toilet;Grab bars in shower;Reacher;Sock aid Prior Function Level of Independence: Needs assistance Needs Assistance:  Gait;Transfers;Bathing;Dressing Bath: Moderate Dressing: Moderate Gait Assistance: walks up to 30 ft with RW with w/c to follow  Transfer Assistance: car transfer with sliding board; typically can do stand-pivot with RW I'ly Able to Take Stairs?: No Comments: pt states husband is the only person with her at night and he isnt in good health. Communication Communication: No difficulties Dominant Hand: Right    Cognition  Overall Cognitive Status: Appears within functional limits for tasks assessed/performed Arousal/Alertness: Awake/alert Orientation Level: Appears intact for tasks assessed Behavior During Session: [facility] for tasks performed    Extremity/Trunk Assessment Right Upper Extremity Assessment RUE ROM/Strength/Tone: Deficits RUE ROM/Strength/Tone Deficits: overall strength is 3+/5 througout UE. note generalized weakness. Left Upper Extremity Assessment LUE ROM/Strength/Tone: Deficits LUE ROM/Strength/Tone Deficits: as above; note generalized weakness Right Lower Extremity Assessment RLE ROM/Strength/Tone: Deficits RLE ROM/Strength/Tone Deficits: Hip flexion 2+/5, knee extension 3+/5 RLE Sensation: Deficits RLE Sensation Deficits: numbness s/p spinal cord hematoma/?infarct Left Lower Extremity Assessment LLE ROM/Strength/Tone: Deficits LLE ROM/Strength/Tone Deficits: hip flexion 2-/5, knee extension 3/5, ankle DF 2+/5 LLE Sensation: Deficits LLE Sensation Deficits: numbness s/p spinal cord hematoma/?infarct   Balance Balance Balance Assessed: Yes Static Standing Balance Static Standing - Balance Support: Bilateral upper extremity supported Static Standing - Level of Assistance: 4: Min assist Static Standing - Comment/# of Minutes: (A) to maintain balance with UE and LE tremors due to overall mm fatigue.  Pt only able to maintain standing for ~1 minute. Dynamic Standing Balance Dynamic Standing - Balance Support: Bilateral upper extremity supported Dynamic Standing - Level  of Assistance: 4: Min assist Dynamic Standing - Balance Activities: Lateral lean/weight shifting (attempted to march in place but unable to clear) Dynamic Standing - Comments: Pt performed weight shifts as well as marching in place.  Pt unable to clear LE to march in place.  End of Session PT - End of Session Equipment Utilized During Treatment: Gait belt Activity Tolerance: Patient tolerated treatment well Patient left: in chair;with call bell/phone within reach;with family/visitor present Nurse Communication: Mobility status  GP     Page Lancon 08/07/2012, 3:33 PM Jake Shark, PT DPT [phone]

## 2012-08-07 NOTE — Progress Notes (Signed)
[facility] PROGRESS NOTE  [name] [name] OZH:[id number] DOB: Aug 10, 1941 DOA: 08/04/2012 PCP: [name] Boston, MD  Assessment/Plan: Principal Problem:  *Colitis Active Problems:  Renal insufficiency  Peripheral edema    1. Colitis continue C. difficile negative we'll discontinue Flagyl, stool culture still pending, patient still had 5 episodes of diarrhea yesterday, if continues the patient may need a colonoscopy 2. Reason cellulitis called off on antibiotics, doubt DVT, has an IVC filter, on Coumadin to be dosed by the pharmacy 3. Recent history of acute kidney injury, creatinine trending down, still not at baseline, discontinue IV fluids , hold off on PICC line for now 4. Hypothyroidism continue Synthroid 5. Right leg swelling Doppler negative, BNP improving 6.  Code Status: full  Family Communication: family updated about patient's clinical progress  Disposition Plan: DC home tomorrow diarrhea is improved   Brief narrative:  [name] [name] is a 71 y.o. female who presents with 2-3 day onset crampy quality abdominal pain located diffusely through her abdomen and bloody diarrhea. Nothing makes symptoms better nor worse. These symptoms occur in the context of a recent admission for AKF, cellulitis of the LLE treated with Zosyn, IV Vanc, Merrem, and finally 14 days of clindamycin.  In the ED her creatinine was noted to be significantly improved to 1.7 (was 4 at time of discharge from last admit during AKF). CT scan of her abdomen demonstrated transverse and descending colitis. Hospitalist has been asked to admit.  Consultants:  None Procedures:  None Antibiotics:  Flagyl HPI/Subjective:  Denies any shortness of breath today      Objective: Filed Vitals:   08/06/12 0831 08/06/12 1342 08/06/12 1959 08/07/12 0522  BP:  180/78 149/71 143/64  Pulse:  100 90 94  Temp:  98.3 F (36.8 C) 98.6 F (37 C) 98.8 F (37.1 C)  TempSrc:   Oral Oral  Resp:  18 16 18   Height:       Weight:      SpO2: 98% 98% 96% 97%    Intake/Output Summary (Last 24 hours) at 08/07/12 1111 Last data filed at 08/07/12 0957  Gross per 24 hour  Intake   1799 ml  Output      0 ml  Net   1799 ml    Exam:  HENT:  Head: Atraumatic.  Nose: Nose normal.  Mouth/Throat: Oropharynx is clear and moist.  Eyes: Conjunctivae are normal. Pupils are equal, round, and reactive to light. No scleral icterus.  Neck: Neck supple. No tracheal deviation present.  Cardiovascular: Normal rate, regular rhythm, normal heart sounds and intact distal pulses.  Pulmonary/Chest: Effort normal and breath sounds normal. No respiratory distress.  Abdominal: Soft. Normal appearance and bowel sounds are normal. She exhibits no distension. There is no tenderness.  Musculoskeletal: She exhibits no edema and no tenderness.  Neurological: She is alert. No cranial nerve deficit.    Data Reviewed: Basic Metabolic Panel:  Lab 08/06/12 6295 08/05/12 0547 08/04/12 1539  NA 137 137 140  K 3.2* 3.3* 3.5  CL 102 105 105  CO2 21 23 24   GLUCOSE 183* 96 112*  BUN 8 9 9   CREATININE 1.46* 1.64* 1.72*  CALCIUM 8.0* 8.0* 8.5  MG -- -- --  PHOS -- -- --    Liver Function Tests:  Lab 08/04/12 1539  AST 28  ALT 15  ALKPHOS 88  BILITOT 0.5  PROT 6.3  ALBUMIN 3.2*   No results found for this basename: LIPASE:5,AMYLASE:5 in the last 168 hours No  results found for this basename: AMMONIA:5 in the last 168 hours  CBC:  Lab 08/05/12 0547 08/04/12 1539  WBC 5.2 6.8  NEUTROABS -- --  HGB 9.3* 10.0*  HCT 29.5* 31.7*  MCV 85.5 85.0  PLT 101* 125*    Cardiac Enzymes:  Lab 08/05/12 0908  CKTOTAL --  CKMB --  CKMBINDEX --  TROPONINI <0.30   BNP (last 3 results)  Basename 08/06/12 0940 08/04/12 1539  PROBNP 6684.0* 8892.0*     CBG:  Lab 08/07/12 0813 08/06/12 0758  GLUCAP 125* 107*    Recent Results (from the past 240 hour(s))  CLOSTRIDIUM DIFFICILE BY PCR     Status: Normal   Collection Time     08/06/12  8:50 AM      Component Value Range Status Comment   C difficile by pcr NEGATIVE  NEGATIVE Final      Studies: Ct Abdomen Pelvis Wo Contrast  08/04/2012  *RADIOLOGY REPORT*  Clinical Data: Bilateral leg swelling.  Abdominal pain.  Chronic renal insufficiency.  CT ABDOMEN AND PELVIS WITHOUT CONTRAST  Technique:  Multidetector CT imaging of the abdomen and pelvis was performed following the standard protocol without intravenous contrast.  Comparison: 07/07/2012. [name]  Findings: Lung Bases: Small bilateral pleural effusion.  Bilateral dependent atelectasis in the lungs.  Low attenuation intravascular compartment suggesting anemia.  Liver:  Unenhanced CT was performed per clinician order.  Lack of IV contrast limits sensitivity and specificity, especially for evaluation of abdominal/pelvic solid viscera.  Spleen:  Unchanged splenomegaly.  Surgical clips near the splenic hilum.  Gallbladder:  Surgically absent.  Common bile duct:  Normal.  Pancreas:  Fatty atrophy.  Adrenal glands:  Normal.  Kidneys:  Mild renal atrophy.  Mild ectasia of the right ureter could be due to ascending urinary tract infection or peristaltic contraction at the time of imaging.  No calculi.  Stomach:  Postoperative changes at the gastroesophageal junction. Gastric bypass.  Small bowel:  Small volume of ascites is present.  No bowel obstruction.  Contrast extends into the colon.  Colon:   Mural thickening of the ascending and transverse colon is present.  This is compatible with colitis although nonspecific in terms of etiology.  Descending colon and sigmoid colon appear normal.  Rectum normal.  Pelvic Genitourinary:  Hysterectomy.  Small amount of ascites in the anatomic pelvis.  Urinary bladder decompressed.  Fatty infiltration of the bladder wall, most commonly associated with chronic inflammatory changes of the bladder.  Bones:  Osteopenia.  Diffuse muscular atrophy.  Lumbar spinal fixation.  Vasculature: Mild  atherosclerosis.  IVC filter.  Retroperitoneal lymphadenopathy appears similar to the prior exam of 07/07/2012.  This is most prominent in the left retroperitoneum and extending along the left common iliac and external iliac vessels.  No right pelvic sidewall adenopathy is present.  IMPRESSION: 1.  Small bilateral pleural effusions with associated atelectasis. 2.  Low attenuation of the intravascular compartment suggesting anemia. 3.  Ascending and transverse colitis with mural thickening. Differential considerations include infection, inflammatory bowel disease and ischemia.  This includes C difficile colitis. 4.  Uncomplicated postoperative changes of gastric bypass. 5.  Mild ectasia of the right ureter which can be associated with ascending urinary tract infection or due to peristaltic contraction; correlation with urinalysis recommended. 6.  Cholecystectomy. IVC filter. Hysterectomy. 7.  No interval change and retroperitoneal and left iliac adenopathy.  The cause is unknown.  Hematalogic malignancy cannot be excluded. 8.  Small amount of ascites.   Original  Report Authenticated By: Andreas Newport, M.D.    Dg Chest 2 View  08/04/2012  *RADIOLOGY REPORT*  Clinical Data: Shortness of breath and leg swelling.  CHEST - 2 VIEW  Comparison: Chest radiograph 07/31/2012 and chest CT 11/15/2010  Findings: Mild cardiomegaly appears stable.  Mediastinal and hilar contours are within normal limits.  There is a small focal opacity at the posterolateral left lung base.  The right lung is clear. The trachea is midline.  Surgical clips are seen in the left upper quadrant.  IMPRESSION: 1.  Small focal opacity in the left lower lobe is favored to be due to atelectasis.  Early / mild airspace disease cannot be completely excluded. 2.  Mild cardiomegaly without failure.   Original Report Authenticated By: Britta Mccreedy, M.D.    US Renal  07/16/2012  *RADIOLOGY REPORT*  Clinical Data: Renal failure.  RENAL/URINARY TRACT  ULTRASOUND COMPLETE  Comparison:  07/13/2012.  Findings:  Right Kidney:  11.7 cm. Normal echotexture.  Normal central sinus echo complex.  No calculi or hydronephrosis.  Left Kidney:  13.0 cm. Normal echotexture.  Normal central sinus echo complex.  No calculi or hydronephrosis. Inferior pole is difficult to visualize due to patient body habitus.  Bladder:  Foley catheter.  Urinary bladder decompressed.  The exam is technically degraded due to obese body habitus.  IMPRESSION:  1.  Normal appearance of the kidneys. 2.  Suboptimal exam due to obese body habitus. 3.  Decompressed urinary bladder with Foley catheter.   Original Report Authenticated By: Andreas Newport, M.D.    Dg Chest Port 1 View  07/19/2012  *RADIOLOGY REPORT*  Clinical Data: Questionable edema.  Acute renal failure.  Fever and weakness.  PORTABLE CHEST - 1 VIEW  Comparison: 07/07/2012  Findings: The cardiac silhouette is normal in size and configuration.  No mediastinal or hilar mass or adenopathy.  Minor linear opacity in the left lateral lower lung is likely subsegmental atelectasis or scarring.  The lungs otherwise clear. No pleural effusion or pneumothorax.  The bony thorax is demineralized.  IMPRESSION: No acute cardiopulmonary disease.   Original Report Authenticated By: Amie Portland, M.D.     Scheduled Meds:   . fentaNYL  25 mcg Transdermal Q72H  . levalbuterol  1.25 mg Nebulization Q8H  . levothyroxine  25 mcg Oral QAC breakfast  . metroNIDAZOLE  500 mg Oral Q8H  . multivitamin with minerals  1 tablet Oral Daily  . potassium chloride  40 mEq Oral Once  . saccharomyces boulardii  250 mg Oral BID  . vitamin B-12  1,000 mcg Oral Daily  . Warfarin - Pharmacist Dosing Inpatient   Does not apply q1800   Continuous Infusions:   Principal Problem:  *Colitis Active Problems:  Renal insufficiency  Peripheral edema    Time spent: 40 minutes   [name]  [facility] Pager [phone]. If 8PM-8AM, please contact  night-coverage at [email or web address], password [facility] 08/07/2012, 11:11 AM  LOS: 3 days

## 2012-08-07 NOTE — Evaluation (Signed)
Occupational Therapy Evaluation Patient Details Name: [name] [name] MRN: [id number] DOB: 08-Feb-1941 Today's Date: 08/07/2012 Time: 8295-6213 OT Time Calculation (min): 23 min  OT Assessment / Plan / Recommendation Clinical Impression  [name] [name] is a 71 y.o. female who presents with 2-3 day onset crampy quality abdominal pain located diffusely through her abdomen and bloody diarrhea. Pt displays generalized weakness, increased pain, and decreased independence with ADL. Will benefit from skilled OT services to improve independence with self care tasks for next venue of care.     OT Assessment  Patient needs continued OT Services    Follow Up Recommendations  SNF (has family and aide during the day but no one that can provide physical assist at night. Aide is only 5X/week)   Barriers to Discharge    Decreased caregiver support at night.    Equipment Recommendations  None recommended by OT    Recommendations for Other Services    Frequency  Min 2X/week    Precautions / Restrictions Precautions Precautions: Fall        ADL  Eating/Feeding: Other (comment) (not tested) Grooming: Simulated;Wash/dry hands;Set up Where Assessed - Grooming: Supported sitting Upper Body Bathing: Simulated;Chest;Right arm;Left arm;Abdomen;Minimal assistance Where Assessed - Upper Body Bathing: Supported sitting Lower Body Bathing: Simulated;Moderate assistance;Other (comment) (seated aspects only without AE. for lower legs) Where Assessed - Lower Body Bathing: Supported sitting Upper Body Dressing: Simulated;Minimal assistance Where Assessed - Upper Body Dressing: Supported sitting Lower Body Dressing: Simulated;+1 Total assistance;Other (comment) (with socks without AE) Where Assessed - Lower Body Dressing: Supported sitting Toilet Transfer: Other (comment) (not assessed) Toileting - Clothing Manipulation and Hygiene: Other (comment) (not assessed) Tub/Shower Transfer Method: Not  assessed Equipment Used: Reacher;Long-handled sponge;Long-handled shoe horn;Sock aid ADL Comments: Per nursing, pt required +2 assist up to chair today. Eval completed this visit at chair level. Pt has all AE for LB dressing but usually her aide or family helps with LB ADL. Reviewed AE use and encouraged her to use AE to be more independent. Pt did practice with sock aid and reacher to doff/don sock. Pt mostly transfers from wheelchair to toilet and into shower. Pt states husband is not in good health and there is no one there at night that can help her. Discussed SNF as an option and pt is willing to consider.     OT Diagnosis: Generalized weakness;Acute pain  OT Problem List: Decreased strength;Decreased activity tolerance;Decreased knowledge of use of DME or AE;Pain OT Treatment Interventions: Self-care/ADL training;Therapeutic activities;DME and/or AE instruction;Patient/family education   OT Goals Acute Rehab OT Goals OT Goal Formulation: With patient Time For Goal Achievement: 08/21/12 Potential to Achieve Goals: Good ADL Goals Pt Will Perform Grooming: with supervision;Sitting, edge of bed;Sitting, chair;Unsupported;Other (comment) (2 tasks for increased activity tolerance. ) ADL Goal: Grooming - Progress: Goal set today Pt Will Perform Lower Body Bathing: with min assist;Sit to stand from chair;Sit to stand from bed;with adaptive equipment;Other (comment) (it pt desires to use AE to be more independent) ADL Goal: Lower Body Bathing - Progress: Goal set today Pt Will Perform Lower Body Dressing: with min assist;Sit to stand from bed;Sit to stand from chair;with adaptive equipment ADL Goal: Lower Body Dressing - Progress: Goal set today Pt Will Transfer to Toilet: with min assist;Stand pivot transfer;with DME;3-in-1 ADL Goal: Toilet Transfer - Progress: Goal set today Pt Will Perform Toileting - Clothing Manipulation: with min assist;Standing ADL Goal: Toileting - Clothing Manipulation -  Progress: Goal set today Additional ADL Goal #1: Pt  will perform orange theraband exercise program for UEs to increase strength and activity tolerance for ADL completion with independence.  ADL Goal: Additional Goal #1 - Progress: Goal set today  Visit Information  Last OT Received On: 08/07/12 Assistance Needed: +2    Subjective Data  Subjective: I have been nauseous Patient Stated Goal: wants to be able to go home   Prior Functioning     Home Living Lives With: Spouse Available Help at Discharge: Family;Personal care attendant;Available 24 hours/day Type of Home: Mobile home Home Access: Ramped entrance Home Layout: One level Bathroom Shower/Tub: Walk-in shower;Curtain (per pt wheelchair rolls into shower) Bathroom Toilet: Handicapped height (elevated) Bathroom Accessibility: Yes How Accessible: Accessible via wheelchair;Accessible via walker Home Adaptive Equipment: Hospital bed;Walker - rolling;Wheelchair - manual;Other (comment);Grab bars around toilet;Grab bars in shower;Reacher;Sock aid Prior Function Level of Independence: Needs assistance Needs Assistance: Gait;Transfers;Bathing;Dressing Bath: Moderate Dressing: Moderate Gait Assistance: walks up to 30 ft with RW with w/c to follow Transfer Assistance: car transfer with sliding board; typically can do stand-pivot with RW I'ly Able to Take Stairs?: No Comments: pt states husband is the only person with her at night and he isnt in good health. Communication Communication: No difficulties Dominant Hand: Right         Vision/Perception     Cognition  Overall Cognitive Status: Appears within functional limits for tasks assessed/performed Arousal/Alertness: Awake/alert Orientation Level: Appears intact for tasks assessed Behavior During Session: [facility] for tasks performed    Extremity/Trunk Assessment Right Upper Extremity Assessment RUE ROM/Strength/Tone: Deficits RUE ROM/Strength/Tone Deficits: overall  strength is 3+/5 througout UE. note generalized weakness. Left Upper Extremity Assessment LUE ROM/Strength/Tone: Deficits LUE ROM/Strength/Tone Deficits: as above; note generalized weakness Right Lower Extremity Assessment RLE ROM/Strength/Tone: Deficits RLE ROM/Strength/Tone Deficits: Hip flexion 2+/5, knee extension 3+/5 RLE Sensation: Deficits RLE Sensation Deficits: numbness s/p spinal cord hematoma/?infarct Left Lower Extremity Assessment LLE ROM/Strength/Tone: Deficits LLE ROM/Strength/Tone Deficits: hip flexion 2-/5, knee extension 3/5, ankle DF 2+/5 LLE Sensation: Deficits LLE Sensation Deficits: numbness s/p spinal cord hematoma/?infarct        Shoulder Instructions     Exercise     Balance Balance Balance Assessed: Yes   End of Session OT - End of Session Activity Tolerance: Patient limited by pain Patient left: in chair;with call bell/phone within reach;Other (comment) (pt requesting to sit up longer. pt to call for back to bed)  GO     Lennox Laity [phone] 08/07/2012, 3:31 PM

## 2012-08-07 NOTE — Progress Notes (Signed)
ANTICOAGULATION CONSULT NOTE - F/U Consult  Pharmacy Consult for Coumadin Indication: h/o PE/DVT  Allergies  Allergen Reactions  . Bee Venom   . Codeine Palpitations    Per patient, "Feels like I'm having a heart attack."    Vital Signs: Temp: 98.8 F (37.1 C) (12/27 0522) Temp src: Oral (12/27 0522) BP: 143/64 mmHg (12/27 0522) Pulse Rate: 94  (12/27 0522)  Labs:  Alvira Philips 08/07/12 0720 08/06/12 0940 08/06/12 0500 08/05/12 0908 08/05/12 0547 08/04/12 1539  HGB -- -- -- -- 9.3* 10.0*  HCT -- -- -- -- 29.5* 31.7*  PLT -- -- -- -- 101* 125*  APTT -- -- -- -- -- --  LABPROT 23.8* -- 23.3* -- 19.7* --  INR 2.24* -- 2.18* -- 1.73* --  HEPARINUNFRC -- -- -- -- -- --  CREATININE -- 1.46* -- -- 1.64* 1.72*  CKTOTAL -- -- -- -- -- --  CKMB -- -- -- -- -- --  TROPONINI -- -- -- <0.30 -- --   Assessment: 71yo female to continue Coumadin for h/o PE/DVT during admission for colitis.Home dose of Coumadin is 1mg  M/W/F/Sat/Sun and 3mg  Tues/Thurs. INR upon admission was 2.03. INR therapeutic at 2.24 after 3-3-1 mg doses. Patient was also on Flagyl which may increase INR . C diff negative so Flagyl DC'd 12/27.    Plan: - resume home dose of 3 mg T/TH and 1 mg all other days. - MWF INR - Monitor for signs and symptoms of bleeding   Thank you, Herby Abraham, Pharm.Sharen Hint phone [phone] 08/07/2012 11:44 AM

## 2012-08-07 NOTE — Telephone Encounter (Signed)
Attempted to contact patient to schedule December Recall appointment.  Patient is in the hospital at this time.  Will call back later.

## 2012-08-08 LAB — GLUCOSE, CAPILLARY: Glucose-Capillary: 109 mg/dL — ABNORMAL HIGH (ref 70–99)

## 2012-08-08 MED ORDER — SACCHAROMYCES BOULARDII 250 MG PO CAPS: 250.0000 mg | ORAL_CAPSULE | Freq: Two times a day (BID) | ORAL | Status: AC

## 2012-08-08 NOTE — Discharge Summary (Signed)
Physician Discharge Summary  [name] [name] MRN: [id number] DOB/AGE: [birth year] 71 y.o.  PCP: Louie Boston, MD   Admit date: 08/04/2012 Discharge date: 08/08/2012  Discharge Diagnoses:     *Colitis Active Problems:  Renal insufficiency  Peripheral edema     Medication List     As of 08/08/2012 10:08 AM    TAKE these medications         ALPRAZolam 0.5 MG tablet   Commonly known as: XANAX   Take 0.5 mg by mouth 3 (three) times daily as needed. For anxiety      fentaNYL 25 MCG/HR   Commonly known as: DURAGESIC - dosed mcg/hr   Place 1 patch (25 mcg total) onto the skin every 3 (three) days.      levothyroxine 25 MCG tablet   Commonly known as: SYNTHROID, LEVOTHROID   Take 25 mcg by mouth daily.      multivitamin tablet   Take 1 tablet by mouth daily.      nystatin cream   Commonly known as: MYCOSTATIN   Apply 1 application topically 2 (two) times daily as needed. For skin irritation      oxycodone 5 MG capsule   Commonly known as: OXY-IR   Take 5 mg by mouth every 6 (six) hours as needed. pain      pediatric multivitamin + iron 10 MG/ML oral solution   Take 1 mL by mouth 2 (two) times daily.      saccharomyces boulardii 250 MG capsule   Commonly known as: FLORASTOR   Take 1 capsule (250 mg total) by mouth 2 (two) times daily.      vitamin B-12 1000 MCG tablet   Commonly known as: CYANOCOBALAMIN   Take 1,000 mcg by mouth daily.      warfarin 1 MG tablet   Commonly known as: COUMADIN   Take 1-3 mg by mouth daily. Take 1mg  on Mon, Wed, Fri, Sat, Sun. And 3mg  rest of days        Discharge Condition: Stable   Disposition: 01-Home or Self Care   Consults:    Significant Diagnostic Studies: Ct Abdomen Pelvis Wo Contrast  08/04/2012  *RADIOLOGY REPORT*  Clinical Data: Bilateral leg swelling.  Abdominal pain.  Chronic renal insufficiency.  CT ABDOMEN AND PELVIS WITHOUT CONTRAST  Technique:  Multidetector CT imaging of the abdomen and pelvis  was performed following the standard protocol without intravenous contrast.  Comparison: 07/07/2012. [facility] hospital  Findings: Lung Bases: Small bilateral pleural effusion.  Bilateral dependent atelectasis in the lungs.  Low attenuation intravascular compartment suggesting anemia.  Liver:  Unenhanced CT was performed per clinician order.  Lack of IV contrast limits sensitivity and specificity, especially for evaluation of abdominal/pelvic solid viscera.  Spleen:  Unchanged splenomegaly.  Surgical clips near the splenic hilum.  Gallbladder:  Surgically absent.  Common bile duct:  Normal.  Pancreas:  Fatty atrophy.  Adrenal glands:  Normal.  Kidneys:  Mild renal atrophy.  Mild ectasia of the right ureter could be due to ascending urinary tract infection or peristaltic contraction at the time of imaging.  No calculi.  Stomach:  Postoperative changes at the gastroesophageal junction. Gastric bypass.  Small bowel:  Small volume of ascites is present.  No bowel obstruction.  Contrast extends into the colon.  Colon:   Mural thickening of the ascending and transverse colon is present.  This is compatible with colitis although nonspecific in terms of etiology.  Descending colon and sigmoid colon appear normal.  Rectum normal.  Pelvic Genitourinary:  Hysterectomy.  Small amount of ascites in the anatomic pelvis.  Urinary bladder decompressed.  Fatty infiltration of the bladder wall, most commonly associated with chronic inflammatory changes of the bladder.  Bones:  Osteopenia.  Diffuse muscular atrophy.  Lumbar spinal fixation.  Vasculature: Mild atherosclerosis.  IVC filter.  Retroperitoneal lymphadenopathy appears similar to the prior exam of 07/07/2012.  This is most prominent in the left retroperitoneum and extending along the left common iliac and external iliac vessels.  No right pelvic sidewall adenopathy is present.  IMPRESSION: 1.  Small bilateral pleural effusions with associated atelectasis. 2.  Low attenuation  of the intravascular compartment suggesting anemia. 3.  Ascending and transverse colitis with mural thickening. Differential considerations include infection, inflammatory bowel disease and ischemia.  This includes C difficile colitis. 4.  Uncomplicated postoperative changes of gastric bypass. 5.  Mild ectasia of the right ureter which can be associated with ascending urinary tract infection or due to peristaltic contraction; correlation with urinalysis recommended. 6.  Cholecystectomy. IVC filter. Hysterectomy. 7.  No interval change and retroperitoneal and left iliac adenopathy.  The cause is unknown.  Hematalogic malignancy cannot be excluded. 8.  Small amount of ascites.   Original Report Authenticated By: Andreas Newport, M.D.    Dg Chest 2 View  08/04/2012  *RADIOLOGY REPORT*  Clinical Data: Shortness of breath and leg swelling.  CHEST - 2 VIEW  Comparison: Chest radiograph 07/31/2012 and chest CT 11/15/2010  Findings: Mild cardiomegaly appears stable.  Mediastinal and hilar contours are within normal limits.  There is a small focal opacity at the posterolateral left lung base.  The right lung is clear. The trachea is midline.  Surgical clips are seen in the left upper quadrant.  IMPRESSION: 1.  Small focal opacity in the left lower lobe is favored to be due to atelectasis.  Early / mild airspace disease cannot be completely excluded. 2.  Mild cardiomegaly without failure.   Original Report Authenticated By: Britta Mccreedy, M.D.    US Renal  07/16/2012  *RADIOLOGY REPORT*  Clinical Data: Renal failure.  RENAL/URINARY TRACT ULTRASOUND COMPLETE  Comparison:  07/13/2012.  Findings:  Right Kidney:  11.7 cm. Normal echotexture.  Normal central sinus echo complex.  No calculi or hydronephrosis.  Left Kidney:  13.0 cm. Normal echotexture.  Normal central sinus echo complex.  No calculi or hydronephrosis. Inferior pole is difficult to visualize due to patient body habitus.  Bladder:  Foley catheter.  Urinary  bladder decompressed.  The exam is technically degraded due to obese body habitus.  IMPRESSION:  1.  Normal appearance of the kidneys. 2.  Suboptimal exam due to obese body habitus. 3.  Decompressed urinary bladder with Foley catheter.   Original Report Authenticated By: Andreas Newport, M.D.    Dg Chest Port 1 View  07/19/2012  *RADIOLOGY REPORT*  Clinical Data: Questionable edema.  Acute renal failure.  Fever and weakness.  PORTABLE CHEST - 1 VIEW  Comparison: 07/07/2012  Findings: The cardiac silhouette is normal in size and configuration.  No mediastinal or hilar mass or adenopathy.  Minor linear opacity in the left lateral lower lung is likely subsegmental atelectasis or scarring.  The lungs otherwise clear. No pleural effusion or pneumothorax.  The bony thorax is demineralized.  IMPRESSION: No acute cardiopulmonary disease.   Original Report Authenticated By: Amie Portland, M.D.       Microbiology: Recent Results (from the past 240 hour(s))  CLOSTRIDIUM DIFFICILE BY PCR  Status: Normal   Collection Time   08/06/12  8:50 AM      Component Value Range Status Comment   C difficile by pcr NEGATIVE  NEGATIVE Final      Labs: Results for orders placed during the hospital encounter of 08/04/12 (from the past 48 hour(s))  PROTIME-INR     Status: Abnormal   Collection Time   08/07/12  7:20 AM      Component Value Range Comment   Prothrombin Time 23.8 (*) 11.6 - 15.2 seconds    INR 2.24 (*) 0.00 - 1.49   GLUCOSE, CAPILLARY     Status: Abnormal   Collection Time   08/07/12  8:13 AM      Component Value Range Comment   Glucose-Capillary 125 (*) 70 - 99 mg/dL   MAGNESIUM     Status: Normal   Collection Time   08/07/12 11:30 AM      Component Value Range Comment   Magnesium 1.5  1.5 - 2.5 mg/dL   BASIC METABOLIC PANEL     Status: Abnormal   Collection Time   08/07/12 11:30 AM      Component Value Range Comment   Sodium 138  135 - 145 mEq/L    Potassium 3.4 (*) 3.5 - 5.1 mEq/L     Chloride 103  96 - 112 mEq/L    CO2 23  19 - 32 mEq/L    Glucose, Bld 144 (*) 70 - 99 mg/dL    BUN 7  6 - 23 mg/dL    Creatinine, Ser 7.82 (*) 0.50 - 1.10 mg/dL    Calcium 8.2 (*) 8.4 - 10.5 mg/dL    GFR calc non Af Amer 36 (*) >90 mL/min    GFR calc Af Amer 41 (*) >90 mL/min   GLUCOSE, CAPILLARY     Status: Abnormal   Collection Time   08/08/12  8:02 AM      Component Value Range Comment   Glucose-Capillary 109 (*) 70 - 99 mg/dL      HPI : [name] [name] is a 71 y.o. female who presents with 2-3 day onset crampy quality abdominal pain located diffusely through her abdomen and bloody diarrhea. Nothing makes symptoms better nor worse. These symptoms occur in the context of a recent admission for AKF, cellulitis of the LLE treated with Zosyn, IV Vanc, Merrem, and finally 14 days of clindamycin.  In the ED her creatinine was noted to be significantly improved to 1.7 (was 4 at time of discharge from last admit during AKF). CT scan of her abdomen demonstrated transverse and descending colitis. Hospitalist has been asked to admit.    HOSPITAL COURSE:  #1 diarrhea C. difficile was found to be negative Patient was empirically started on Flagyl Stool culture still pending The patient's diarrhea has resolved She was started on Flagyl empirically which was discontinued, she was then started on florastor She did not have any hematochezia or melena during this hospitalization CT scan did show a ascending and transverse colitis The patient had a colonoscopy in [facility] hospital 2 years ago that showed 2 colonic polyps that were removed She will need a repeat colonoscopy in the outpatient setting  #2 recent episode of cellulitis Result on Coumadin INR therapeutic  #3 acute kidney injury during her last admission Creatinine is improving and approaching baseline at 1.45  #4 hypothyroidism Last TSH was 10.5 for the beginning of December Continue Synthroid  #5 deconditioning Physical  therapy recommended home PT, OT recommended  SNF, however the patient would like to go home      Discharge Exam:   Blood pressure 146/66, pulse 83, temperature 98.5 F (36.9 C), temperature source Oral, resp. rate 18, height 5\' 7"  (1.702 m), weight 127 kg (279 lb 15.8 oz), SpO2 98.00%.  General: NAD, resting comfortably in bed  Eyes: PEERLA EOMI  ENT: mucous membranes moist  Neck: supple w/o JVD  Cardiovascular: RRR w/o MRG  Respiratory: CTA B  Abdomen: soft, nt, nd, bs+  Skin: old skin changes from healing cellulitis on LLE  Musculoskeletal: MAE, full ROM all 4 extremities, 2+ pitting edema RLE, 4+ pitting edema LLE  Psychiatric: normal tone and affect  Neurologic: AAOx3, grossly non-focal        Discharge Orders    Future Orders Please Complete By Expires   Diet - low sodium heart healthy      Increase activity slowly         Follow-up Information    Follow up with TAPPER,DAVID B, MD. Schedule an appointment as soon as possible for a visit in 1 week.   Contact information:   [address] [place] [address] [phone]          Signed: Richarda Overlie 08/08/2012, 10:08 AM

## 2012-08-14 ENCOUNTER — Telehealth: Payer: Self-pay | Admitting: Physician Assistant

## 2012-08-14 NOTE — Telephone Encounter (Signed)
Left message for patient to call and schedule appointment from Recall  ° °

## 2013-01-21 ENCOUNTER — Encounter (HOSPITAL_COMMUNITY): Payer: Self-pay | Admitting: *Deleted

## 2013-01-21 ENCOUNTER — Inpatient Hospital Stay (HOSPITAL_COMMUNITY)
Admission: EM | Admit: 2013-01-21 | Discharge: 2013-01-25 | DRG: 378 | Disposition: A | Discharge status: Home Health | Payer: PRIVATE HEALTH INSURANCE | Attending: Family Medicine | Admitting: Family Medicine

## 2013-01-21 DIAGNOSIS — I2510 Atherosclerotic heart disease of native coronary artery without angina pectoris: Secondary | ICD-10-CM | POA: Diagnosis present

## 2013-01-21 DIAGNOSIS — Z7901 Long term (current) use of anticoagulants: Secondary | ICD-10-CM

## 2013-01-21 DIAGNOSIS — A419 Sepsis, unspecified organism: Secondary | ICD-10-CM

## 2013-01-21 DIAGNOSIS — I10 Essential (primary) hypertension: Secondary | ICD-10-CM

## 2013-01-21 DIAGNOSIS — E876 Hypokalemia: Secondary | ICD-10-CM

## 2013-01-21 DIAGNOSIS — Z86718 Personal history of other venous thrombosis and embolism: Secondary | ICD-10-CM

## 2013-01-21 DIAGNOSIS — R0789 Other chest pain: Secondary | ICD-10-CM

## 2013-01-21 DIAGNOSIS — Z8679 Personal history of other diseases of the circulatory system: Secondary | ICD-10-CM

## 2013-01-21 DIAGNOSIS — R0602 Shortness of breath: Secondary | ICD-10-CM

## 2013-01-21 DIAGNOSIS — K209 Esophagitis, unspecified without bleeding: Secondary | ICD-10-CM | POA: Diagnosis present

## 2013-01-21 DIAGNOSIS — J159 Unspecified bacterial pneumonia: Secondary | ICD-10-CM

## 2013-01-21 DIAGNOSIS — M549 Dorsalgia, unspecified: Secondary | ICD-10-CM

## 2013-01-21 DIAGNOSIS — IMO0001 Reserved for inherently not codable concepts without codable children: Secondary | ICD-10-CM

## 2013-01-21 DIAGNOSIS — IMO0002 Reserved for concepts with insufficient information to code with codable children: Secondary | ICD-10-CM

## 2013-01-21 DIAGNOSIS — E7800 Pure hypercholesterolemia, unspecified: Secondary | ICD-10-CM

## 2013-01-21 DIAGNOSIS — R6520 Severe sepsis without septic shock: Secondary | ICD-10-CM

## 2013-01-21 DIAGNOSIS — F341 Dysthymic disorder: Secondary | ICD-10-CM | POA: Diagnosis present

## 2013-01-21 DIAGNOSIS — N179 Acute kidney failure, unspecified: Secondary | ICD-10-CM

## 2013-01-21 DIAGNOSIS — E119 Type 2 diabetes mellitus without complications: Secondary | ICD-10-CM

## 2013-01-21 DIAGNOSIS — I252 Old myocardial infarction: Secondary | ICD-10-CM

## 2013-01-21 DIAGNOSIS — K529 Noninfective gastroenteritis and colitis, unspecified: Secondary | ICD-10-CM

## 2013-01-21 DIAGNOSIS — D649 Anemia, unspecified: Secondary | ICD-10-CM

## 2013-01-21 DIAGNOSIS — R609 Edema, unspecified: Secondary | ICD-10-CM | POA: Diagnosis present

## 2013-01-21 DIAGNOSIS — K219 Gastro-esophageal reflux disease without esophagitis: Secondary | ICD-10-CM | POA: Diagnosis present

## 2013-01-21 DIAGNOSIS — G43909 Migraine, unspecified, not intractable, without status migrainosus: Secondary | ICD-10-CM

## 2013-01-21 DIAGNOSIS — N390 Urinary tract infection, site not specified: Secondary | ICD-10-CM

## 2013-01-21 DIAGNOSIS — G8929 Other chronic pain: Secondary | ICD-10-CM

## 2013-01-21 DIAGNOSIS — K644 Residual hemorrhoidal skin tags: Secondary | ICD-10-CM | POA: Diagnosis present

## 2013-01-21 DIAGNOSIS — G8220 Paraplegia, unspecified: Secondary | ICD-10-CM

## 2013-01-21 DIAGNOSIS — D62 Acute posthemorrhagic anemia: Secondary | ICD-10-CM

## 2013-01-21 DIAGNOSIS — N289 Disorder of kidney and ureter, unspecified: Secondary | ICD-10-CM

## 2013-01-21 DIAGNOSIS — L0390 Cellulitis, unspecified: Secondary | ICD-10-CM

## 2013-01-21 DIAGNOSIS — J9600 Acute respiratory failure, unspecified whether with hypoxia or hypercapnia: Secondary | ICD-10-CM

## 2013-01-21 DIAGNOSIS — Z9884 Bariatric surgery status: Secondary | ICD-10-CM

## 2013-01-21 DIAGNOSIS — R600 Localized edema: Secondary | ICD-10-CM

## 2013-01-21 DIAGNOSIS — K922 Gastrointestinal hemorrhage, unspecified: Principal | ICD-10-CM

## 2013-01-21 DIAGNOSIS — R930 Abnormal findings on diagnostic imaging of skull and head, not elsewhere classified: Secondary | ICD-10-CM

## 2013-01-21 DIAGNOSIS — Z86711 Personal history of pulmonary embolism: Secondary | ICD-10-CM

## 2013-01-21 DIAGNOSIS — D696 Thrombocytopenia, unspecified: Secondary | ICD-10-CM | POA: Diagnosis present

## 2013-01-21 DIAGNOSIS — Z6839 Body mass index (BMI) 39.0-39.9, adult: Secondary | ICD-10-CM

## 2013-01-21 HISTORY — DX: Other complications of anesthesia, initial encounter: T88.59XA

## 2013-01-21 HISTORY — DX: Transient cerebral ischemic attack, unspecified: G45.9

## 2013-01-21 HISTORY — DX: Adverse effect of unspecified anesthetic, initial encounter: T41.45XA

## 2013-01-21 HISTORY — DX: Nausea with vomiting, unspecified: Z98.890

## 2013-01-21 HISTORY — DX: Nausea with vomiting, unspecified: R11.2

## 2013-01-21 LAB — CBC WITH DIFFERENTIAL/PLATELET
Basophils Relative: 0 % (ref 0–1)
Eosinophils Absolute: 0.3 10*3/uL (ref 0.0–0.7)
Eosinophils Relative: 3 % (ref 0–5)
Lymphs Abs: 1.7 10*3/uL (ref 0.7–4.0)
MCH: 25.9 pg — ABNORMAL LOW (ref 26.0–34.0)
MCHC: 31.3 g/dL (ref 30.0–36.0)
MCV: 82.8 fL (ref 78.0–100.0)
Platelets: 173 10*3/uL (ref 150–400)
RBC: 3.2 MIL/uL — ABNORMAL LOW (ref 3.87–5.11)

## 2013-01-21 LAB — COMPREHENSIVE METABOLIC PANEL
Albumin: 2.6 g/dL — ABNORMAL LOW (ref 3.5–5.2)
BUN: 12 mg/dL (ref 6–23)
CO2: 32 mEq/L (ref 19–32)
Calcium: 8.4 mg/dL (ref 8.4–10.5)
Chloride: 99 mEq/L (ref 96–112)
Creatinine, Ser: 1.11 mg/dL — ABNORMAL HIGH (ref 0.50–1.10)
GFR calc non Af Amer: 49 mL/min — ABNORMAL LOW (ref >90)
Total Bilirubin: 0.5 mg/dL (ref 0.3–1.2)

## 2013-01-21 LAB — SAMPLE TO BLOOD BANK

## 2013-01-21 LAB — PROTIME-INR: Prothrombin Time: 20.1 seconds — ABNORMAL HIGH (ref 11.6–15.2)

## 2013-01-21 MED ORDER — SODIUM CHLORIDE 0.9 % IV SOLN
INTRAVENOUS | Status: DC
  Administered 2013-01-21: 19:00:00 via INTRAVENOUS

## 2013-01-21 NOTE — ED Notes (Signed)
Blood in stool x 2 months-bright red blood, c/o getting weaker, pt pale in color, SOB and dizziness at times

## 2013-01-21 NOTE — ED Notes (Signed)
Patient states she does not need anything at this time. 

## 2013-01-21 NOTE — ED Notes (Signed)
Patient sitting up in bed; notified patient she is waiting on bed assignment for admission.  Patient has no complaints at this time.

## 2013-01-21 NOTE — H&P (Signed)
[facility] History and Physical  [name] [name]  ZOX:[id number]  DOB: 09/20/1940   DOA: 01/21/2013   PCP:   Louie Boston, MD   Chief Complaint:  Rectal bleeding for 2 months   HPI: [name] [name] is a 71 y.o. female.   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 The bleeding is sometimes very heavy and sometimes scant. She has known hemorrhoids and assumed this was the cause. Her stool is sometimes black but she does take iron tablets. Her home health nurse remarked that she looked very pale today, and is very concerned that she should come to the emergency room to be evaluated. In the emergency room she was noted to be anemic, beyond her baseline on the hospitalist service called to assist.  She gives a history of a colonoscopy about history to 5 years ago, reported to have a polyp but does not remember having any significant issues; denies a history of diverticulosis.  She has noted increasing dizziness and lightheadedness, and increasing fatigue the past 2 months. The nurses of note her urine to be foul-smelling   Rewiew of Systems:   All systems negative except as marked bold or noted in the HPI;  Constitutional:    malaise, fever and chills. ;  Eyes:   eye pain, redness and discharge. ;  ENMT:   ear pain, hoarseness, nasal congestion, sinus pressure and sore throat. ;  Cardiovascular:    chest pain, palpitations, diaphoresis, dyspnea and peripheral edema.since last year. Respiratory:   cough, hemoptysis, wheezing and stridor. ;  Gastrointestinal:  nausea, vomiting, diarrhea, constipation, chronic abdominal pain, melena, blood in stool, hematemesis, jaundice and rectal bleeding. unusual weight loss..   Genitourinary:    frequency, dysuria,  Dribbling incontinence since spinal surgery,flank pain and  hematuria; chronic vagina soreness reportedly associated with failed prolapse repair Musculoskeletal:     .  swelling and trauma.;  Skin: .  pruritus, rash, abrasions, bruising and skin lesion.; ulcerations Neuro:    headache, lightheadedness and neck stiffness.  weakness, altered level of consciousness, altered mental status, extremity weakness, burning feet, involuntary movement, seizure and syncope.  Psych:    anxiety, depression, insomnia, tearfulness, panic attacks, hallucinations, paranoia, suicidal or homicidal ideation    Past Medical History  Diagnosis Date  . Other chest pain   . Other chronic pain   . Esophageal reflux   . Unspecified essential hypertension   . Personal history of pulmonary embolism   . Myalgia and myositis, unspecified   . Coronary atherosclerosis of native coronary artery     CATHETERIZATION X 3  . Dysthymic disorder   . Morbid obesity   . Type II or unspecified type diabetes mellitus without mention of complication, not stated as uncontrolled   . Fibromyalgia   . GERD (gastroesophageal reflux disease)   . Anxiety   . Myocardial infarction     Past Surgical History  Procedure Laterality Date  . Back surgery      X 2 FOR DISK PROBLEMS  . Abdominal hysterectomy    . Gastric bypass      HISTORY OF  . Replacement total knee    . Carpal tunnel release    . Bladder suspension    . Appendectomy    . Cholecystectomy      Medications:  HOME MEDS: Prior to Admission medications  Medication Sig Start Date End Date Taking? Authorizing Provider  ALPRAZolam Prudy Feeler) 0.5 MG tablet Take 0.5 mg by mouth 3 (three) times daily as needed. For anxiety   Yes Historical Provider, MD  fentaNYL (DURAGESIC - DOSED MCG/HR) 100 MCG/HR Place 1 patch onto the skin every 3 (three) days.   Yes Historical Provider, MD  ferrous sulfate 325 (65 FE) MG tablet Take 325 mg by mouth daily with breakfast.   Yes Historical Provider, MD  furosemide (LASIX) 20 MG tablet Take 20 mg  by mouth daily.   Yes Historical Provider, MD  levothyroxine (SYNTHROID, LEVOTHROID) 25 MCG tablet Take 25 mcg by mouth daily.   Yes Historical Provider, MD  metoprolol tartrate (LOPRESSOR) 25 MG tablet Take 12.5 mg by mouth 2 (two) times daily.   Yes Historical Provider, MD  Multiple Vitamin (MULTIVITAMIN) tablet Take 1 tablet by mouth daily.     Yes Historical Provider, MD  omeprazole (PRILOSEC) 40 MG capsule Take 40 mg by mouth daily.   Yes Historical Provider, MD  oxyCODONE (OXY IR/ROXICODONE) 5 MG immediate release tablet Take 5 mg by mouth every 4 (four) hours as needed for pain.   Yes Historical Provider, MD  potassium chloride (K-DUR) 10 MEQ tablet Take 10 mEq by mouth daily.   Yes Historical Provider, MD  vitamin B-12 (CYANOCOBALAMIN) 1000 MCG tablet Take 1,000 mcg by mouth daily.   Yes Historical Provider, MD  warfarin (COUMADIN) 3 MG tablet Take 3 mg by mouth daily.   Yes Historical Provider, MD     Allergies:  Allergies  Allergen Reactions  . Bee Venom   . Codeine Palpitations    Per patient, "Feels like I'm having a heart attack."    Social History:   reports that she has never smoked. She has never used smokeless tobacco. She reports that she does not drink alcohol or use illicit drugs.  Family History: Family History  Problem Relation Age of Onset  . Coronary artery disease      FAMILY HISTORY  . Diabetes type II Mother   . Heart disease Mother   . Lymphoma Father      Physical Exam: Filed Vitals:   01/21/13 1653 01/21/13 1925 01/21/13 2237  BP: 102/62 109/59 118/68  Pulse: 97 90 90  Temp: 97.9 F (36.6 C)  98.6 F (37 C)  TempSrc: Oral  Oral  Resp: 15 12 16   Height:   5\' 7"  (1.702 m)  Weight:   113.1 kg (249 lb 5.4 oz)  SpO2: 99% 96% 96%   Blood pressure 118/68, pulse 90, temperature 98.6 F (37 C), temperature source Oral, resp. rate 16, height 5\' 7"  (1.702 m), weight 113.1 kg (249 lb 5.4 oz), SpO2 96.00%.  GEN:  Pleasant but depressed-looking obese  Caucasian lady in lying bed i; cooperative with exam PSYCH:  alert and oriented x4;  affect is appropriate. HEENT: Mucous membranes pale and anicteric; PERRLA; EOM intact; no cervical lymphadenopathy  Breasts:: Not examined CHEST WALL: No tenderness CHEST: Normal respiration, clear to auscultation bilaterally HEART: Regular rate and rhythm; 3/6 systolic murmur BACK: No kyphosis no scoliosis; no CVA tenderness ABDOMEN: Obese, diffuse upper abdominal tenderness which she says is not new; no masses, no organomegaly, normal abdominal bowel sounds; large pannus; edema of pannus Rectal Exam: Not done EXTREMITIES: No bone or joint deformity; age-appropriate arthropathy of the hands and knees; 2-3+edema of lower extremity; extending to abdominal wall no ulcerations. Genitalia: not examined PULSES: 2+ and symmetric SKIN: Normal hydration no rash  or ulceration CNS: Cranial nerves 2-12 grossly intact no focal lateralizing neurologic deficit; paraplegia able to move feet but I cannot lift them against gravity   Labs on Admission:  Basic Metabolic Panel:  Recent Labs Lab 01/21/13 1905  NA 136  K 4.0  CL 99  CO2 32  GLUCOSE 113*  BUN 12  CREATININE 1.11*  CALCIUM 8.4   Liver Function Tests:  Recent Labs Lab 01/21/13 1905  AST 23  ALT 9  ALKPHOS 130*  BILITOT 0.5  PROT 6.0  ALBUMIN 2.6*   No results found for this basename: LIPASE, AMYLASE,  in the last 168 hours No results found for this basename: AMMONIA,  in the last 168 hours CBC:  Recent Labs Lab 01/21/13 1905  WBC 9.5  NEUTROABS 6.7  HGB 8.3*  HCT 26.5*  MCV 82.8  PLT 173   Cardiac Enzymes: No results found for this basename: CKTOTAL, CKMB, CKMBINDEX, TROPONINI,  in the last 168 hours BNP: No components found with this basename: POCBNP,  D-dimer: No components found with this basename: D-DIMER,  CBG: No results found for this basename: GLUCAP,  in the last 168 hours  Radiological Exams on Admission: No  results found.   Assessment/Plan  Active Problems:  GI bleed     Anemia   DM   HYPERTENSION   History of pulmonary embolism   Peripheral edema, chronic    Chronic back pain   Paraplegia following spinal cord injury   UTI (urinary tract infection)   PLAN: Admit this patient to telemetry; cardiovascular support; monitor hemoglobin; type and screen and prepare of packed red cells; refuses necessary; consult GI service for assistance  Start empiric Rocephin for urinary tract infection after the initiation of urine cultures; We'll hold off on CT scan of the abdomen until after GI evaluation. Vagina examination was not done, but will give empiric Diflucan because of a history of vaginal soreness  Other plans as per orders.  Code Status: Full code  Family Communication:  Plans discuss with patient and family at bedside    Brion Sossamon Nocturnist [facility] Pager [phone]   01/21/2013, 11:21 PM

## 2013-01-21 NOTE — ED Provider Notes (Signed)
History     CSN: [id number]  Arrival date & time 01/21/13  1647   First MD Initiated Contact with Patient 01/21/13 1833      Chief Complaint  Patient presents with  . GI Bleeding    (Consider location/radiation/quality/duration/timing/severity/associated sxs/prior treatment) The history is provided by the patient.   patient states she's had blood that she thinks is from her hemorrhoids for the last 2 months. She states she saw her primary care doctor did not do any blood work etc. she is aware of. She states that person said they did not provide the services that she was going to need.. She states she's had some black stool occasionally. She states she is on iron. She's also on Coumadin. No other bleeding. She said she has hemorrhoids and sometimes has blood on the toilet paper. She states she's had increased fatigue he cannot walk like she used to. She states she gets short of breath. She some mild lower abdominal pain. No diarrhea. She's had previous kidney problems.  Past Medical History  Diagnosis Date  . Other chest pain   . Other chronic pain   . Esophageal reflux   . Unspecified essential hypertension   . Personal history of pulmonary embolism   . Myalgia and myositis, unspecified   . Coronary atherosclerosis of native coronary artery     CATHETERIZATION X 3  . Dysthymic disorder   . Morbid obesity   . Type II or unspecified type diabetes mellitus without mention of complication, not stated as uncontrolled   . Fibromyalgia   . GERD (gastroesophageal reflux disease)   . Anxiety   . Myocardial infarction     Past Surgical History  Procedure Laterality Date  . Back surgery      X 2 FOR DISK PROBLEMS  . Abdominal hysterectomy    . Gastric bypass      HISTORY OF  . Replacement total knee    . Carpal tunnel release    . Bladder suspension    . Cholecystectomy      Family History  Problem Relation Age of Onset  . Coronary artery disease      FAMILY HISTORY  .  Diabetes type II Mother   . Heart disease Mother   . Lymphoma Father     History  Substance Use Topics  . Smoking status: Never Smoker   . Smokeless tobacco: Never Used  . Alcohol Use: No    OB History   Grav Para Term Preterm Abortions TAB SAB Ect Mult Living                  Review of Systems  Constitutional: Positive for fatigue. Negative for activity change and appetite change.  HENT: Negative for neck stiffness.   Eyes: Negative for pain.  Respiratory: Negative for chest tightness and shortness of breath.   Cardiovascular: Negative for chest pain and leg swelling.  Gastrointestinal: Positive for abdominal pain and blood in stool. Negative for nausea, vomiting and diarrhea.  Genitourinary: Negative for flank pain.  Musculoskeletal: Negative for back pain.  Skin: Positive for pallor. Negative for rash.  Neurological: Negative for weakness, numbness and headaches.  Hematological: Does not bruise/bleed easily.  Psychiatric/Behavioral: Negative for behavioral problems.    Allergies  Bee venom and Codeine  Home Medications   No current outpatient prescriptions on file.  BP 118/68  Pulse 90  Temp(Src) 98.6 F (37 C) (Oral)  Resp 16  Ht 5\' 7"  (1.702 m)  Wt 249  lb 5.4 oz (113.1 kg)  BMI 39.04 kg/m2  SpO2 96%  Physical Exam  Nursing note and vitals reviewed. Constitutional: She is oriented to person, place, and time. She appears well-developed and well-nourished.  Patient appears pale  HENT:  Head: Normocephalic and atraumatic.  Eyes: EOM are normal. Pupils are equal, round, and reactive to light.  Neck: Normal range of motion. Neck supple.  Cardiovascular: Normal rate, regular rhythm and normal heart sounds.   No murmur heard. Pulmonary/Chest: Effort normal and breath sounds normal. No respiratory distress. She has no wheezes. She has no rales.  Abdominal: Soft. Bowel sounds are normal. She exhibits no distension. There is tenderness. There is no rebound and  no guarding.  Mild lower abdominal tenderness.  Musculoskeletal: Normal range of motion.  Neurological: She is alert and oriented to person, place, and time. No cranial nerve deficit.  Skin: Skin is warm and dry. There is pallor.  Psychiatric: She has a normal mood and affect. Her speech is normal.    ED Course  Procedures (including critical care time)  Labs Reviewed  CBC WITH DIFFERENTIAL - Abnormal; Notable for the following:    RBC 3.20 (*)    Hemoglobin 8.3 (*)    HCT 26.5 (*)    MCH 25.9 (*)    RDW 18.7 (*)    All other components within normal limits  PROTIME-INR - Abnormal; Notable for the following:    Prothrombin Time 20.1 (*)    INR 1.78 (*)    All other components within normal limits  COMPREHENSIVE METABOLIC PANEL - Abnormal; Notable for the following:    Glucose, Bld 113 (*)    Creatinine, Ser 1.11 (*)    Albumin 2.6 (*)    Alkaline Phosphatase 130 (*)    GFR calc non Af Amer 49 (*)    GFR calc Af Amer 57 (*)    All other components within normal limits  SAMPLE TO BLOOD BANK   No results found.   1. Anemia   2. GI bleed       MDM  Patient with GI bleeding. Hemoglobin is 8.3. She states she symptomatic she cannot do the physical activity that she normally could. Her INR is 1.78 and she is on Coumadin. Previous hemoglobins were 9 or 10, although she has had some in the upper 8s. She'll be admitted to internal medicine for further evaluation and treatment.        Juliet Rude. Rubin Payor, MD 01/22/13 0001

## 2013-01-22 ENCOUNTER — Encounter (HOSPITAL_COMMUNITY): Payer: Self-pay | Admitting: Internal Medicine

## 2013-01-22 DIAGNOSIS — IMO0002 Reserved for concepts with insufficient information to code with codable children: Secondary | ICD-10-CM

## 2013-01-22 DIAGNOSIS — D649 Anemia, unspecified: Secondary | ICD-10-CM | POA: Diagnosis present

## 2013-01-22 DIAGNOSIS — G8220 Paraplegia, unspecified: Secondary | ICD-10-CM

## 2013-01-22 DIAGNOSIS — K219 Gastro-esophageal reflux disease without esophagitis: Secondary | ICD-10-CM

## 2013-01-22 DIAGNOSIS — G8929 Other chronic pain: Secondary | ICD-10-CM | POA: Diagnosis present

## 2013-01-22 DIAGNOSIS — R1310 Dysphagia, unspecified: Secondary | ICD-10-CM

## 2013-01-22 DIAGNOSIS — N390 Urinary tract infection, site not specified: Secondary | ICD-10-CM

## 2013-01-22 DIAGNOSIS — K625 Hemorrhage of anus and rectum: Secondary | ICD-10-CM

## 2013-01-22 DIAGNOSIS — K922 Gastrointestinal hemorrhage, unspecified: Secondary | ICD-10-CM | POA: Diagnosis present

## 2013-01-22 DIAGNOSIS — E119 Type 2 diabetes mellitus without complications: Secondary | ICD-10-CM

## 2013-01-22 LAB — URINE MICROSCOPIC-ADD ON

## 2013-01-22 LAB — CBC
MCV: 83.8 fL (ref 78.0–100.0)
Platelets: 143 10*3/uL — ABNORMAL LOW (ref 150–400)
RDW: 18.8 % — ABNORMAL HIGH (ref 11.5–15.5)
WBC: 10.7 10*3/uL — ABNORMAL HIGH (ref 4.0–10.5)

## 2013-01-22 LAB — BASIC METABOLIC PANEL
CO2: 30 mEq/L (ref 19–32)
Calcium: 8.1 mg/dL — ABNORMAL LOW (ref 8.4–10.5)
Creatinine, Ser: 1.12 mg/dL — ABNORMAL HIGH (ref 0.50–1.10)
GFR calc Af Amer: 56 mL/min — ABNORMAL LOW (ref >90)

## 2013-01-22 LAB — GLUCOSE, CAPILLARY
Glucose-Capillary: 87 mg/dL (ref 70–99)
Glucose-Capillary: 91 mg/dL (ref 70–99)
Glucose-Capillary: 84 mg/dL (ref 70–99)
Glucose-Capillary: 81 mg/dL (ref 70–99)

## 2013-01-22 LAB — URINALYSIS, ROUTINE W REFLEX MICROSCOPIC
Ketones, ur: NEGATIVE mg/dL
Nitrite: POSITIVE — AB
Protein, ur: NEGATIVE mg/dL

## 2013-01-22 LAB — TSH: TSH: 1.842 u[IU]/mL (ref 0.350–4.500)

## 2013-01-22 LAB — ABO/RH: ABO/RH(D): A POS

## 2013-01-22 LAB — PREPARE RBC (CROSSMATCH)

## 2013-01-22 LAB — MAGNESIUM: Magnesium: 2.3 mg/dL (ref 1.5–2.5)

## 2013-01-22 LAB — PROTIME-INR
INR: 1.76 — ABNORMAL HIGH (ref 0.00–1.49)
Prothrombin Time: 19.9 seconds — ABNORMAL HIGH (ref 11.6–15.2)

## 2013-01-22 MED ORDER — INSULIN ASPART 100 UNIT/ML ~~LOC~~ SOLN: 0.0000 [IU] | Freq: Every day | SUBCUTANEOUS | Status: DC

## 2013-01-22 MED ORDER — ONDANSETRON HCL 4 MG/2ML IJ SOLN
4.0000 mg | INTRAMUSCULAR | Status: DC | PRN
  Filled 2013-01-22: qty 2

## 2013-01-22 MED ORDER — DEXTROSE 5 % IV SOLN
1.0000 g | Freq: Every day | INTRAVENOUS | Status: DC
  Administered 2013-01-22 – 2013-01-24 (×4): 1 g via INTRAVENOUS
  Filled 2013-01-22 (×4): qty 10

## 2013-01-22 MED ORDER — INSULIN ASPART 100 UNIT/ML ~~LOC~~ SOLN: 0.0000 [IU] | Freq: Three times a day (TID) | SUBCUTANEOUS | Status: DC

## 2013-01-22 MED ORDER — ALPRAZOLAM 0.5 MG PO TABS
0.5000 mg | ORAL_TABLET | Freq: Three times a day (TID) | ORAL | Status: DC | PRN
  Administered 2013-01-22 – 2013-01-24 (×4): 0.5 mg via ORAL
  Filled 2013-01-22 (×4): qty 1

## 2013-01-22 MED ORDER — LEVOTHYROXINE SODIUM 25 MCG PO TABS
25.0000 ug | ORAL_TABLET | Freq: Every day | ORAL | Status: DC
  Administered 2013-01-22 – 2013-01-25 (×3): 25 ug via ORAL
  Filled 2013-01-22 (×3): qty 1

## 2013-01-22 MED ORDER — METOPROLOL TARTRATE 25 MG PO TABS
12.5000 mg | ORAL_TABLET | Freq: Two times a day (BID) | ORAL | Status: DC
  Administered 2013-01-22 – 2013-01-25 (×3): 12.5 mg via ORAL
  Filled 2013-01-22 (×5): qty 1

## 2013-01-22 MED ORDER — FLUCONAZOLE IN SODIUM CHLORIDE 200-0.9 MG/100ML-% IV SOLN
200.0000 mg | INTRAVENOUS | Status: DC
  Administered 2013-01-23 – 2013-01-24 (×2): 200 mg via INTRAVENOUS
  Filled 2013-01-22 (×3): qty 100

## 2013-01-22 MED ORDER — FLUCONAZOLE IN SODIUM CHLORIDE 400-0.9 MG/200ML-% IV SOLN
400.0000 mg | Freq: Once | INTRAVENOUS | Status: AC
  Administered 2013-01-22: 400 mg via INTRAVENOUS
  Filled 2013-01-22: qty 200

## 2013-01-22 MED ORDER — PEG 3350-KCL-NA BICARB-NACL 420 G PO SOLR
4000.0000 mL | Freq: Once | ORAL | Status: AC
  Administered 2013-01-22: 4000 mL via ORAL
  Filled 2013-01-22: qty 4000

## 2013-01-22 MED ORDER — ACETAMINOPHEN 325 MG PO TABS: 650.0000 mg | ORAL_TABLET | ORAL | Status: DC | PRN

## 2013-01-22 MED ORDER — FLUCONAZOLE IN SODIUM CHLORIDE 200-0.9 MG/100ML-% IV SOLN
INTRAVENOUS | Status: AC
  Filled 2013-01-22: qty 200

## 2013-01-22 MED ORDER — BIOTENE DRY MOUTH MT LIQD
15.0000 mL | Freq: Two times a day (BID) | OROMUCOSAL | Status: DC
  Administered 2013-01-22 – 2013-01-25 (×6): 15 mL via OROMUCOSAL

## 2013-01-22 MED ORDER — SODIUM CHLORIDE 0.9 % IJ SOLN
3.0000 mL | Freq: Two times a day (BID) | INTRAMUSCULAR | Status: DC
  Administered 2013-01-22 (×2): 3 mL via INTRAVENOUS

## 2013-01-22 MED ORDER — OXYCODONE HCL 5 MG PO TABS
5.0000 mg | ORAL_TABLET | ORAL | Status: DC | PRN
  Administered 2013-01-22 – 2013-01-24 (×6): 5 mg via ORAL
  Filled 2013-01-22 (×6): qty 1

## 2013-01-22 MED ORDER — POTASSIUM CHLORIDE IN NACL 20-0.9 MEQ/L-% IV SOLN
INTRAVENOUS | Status: DC
  Administered 2013-01-22: 02:00:00 via INTRAVENOUS

## 2013-01-22 MED ORDER — DEXTROSE 5 % IV SOLN
INTRAVENOUS | Status: AC
  Filled 2013-01-22: qty 10

## 2013-01-22 MED ORDER — FENTANYL 100 MCG/HR TD PT72
100.0000 ug | MEDICATED_PATCH | TRANSDERMAL | Status: DC
  Administered 2013-01-24: 100 ug via TRANSDERMAL
  Filled 2013-01-22: qty 1

## 2013-01-22 MED ORDER — TRAZODONE HCL 50 MG PO TABS: 50.0000 mg | ORAL_TABLET | Freq: Every evening | ORAL | Status: DC | PRN

## 2013-01-22 NOTE — Progress Notes (Signed)
Patient seen, independently examined and chart reviewed. I agree with exam, assessment and plan discussed with Toya Smothers, NP.  Interval history/Subjective: Gastroenterology plans colonoscopy in the morning. She feels generally weak but no other complaints. No abdominal pain. Nurse reports no bleeding. Status post 2 units packed red blood cell transfusion.  Objective: Afebrile, borderline hypotension with systolic predominately 90 or above. Appears generally weak but nontoxic. Cardiovascular regular rate and rhythm. Telemetry sinus rhythm, no arrhythmias. Respiratory clear to auscultation bilaterally. 1+ bilateral lower extremity edema. Abdomen soft, nontender, nondistended.  Labs/studies: Basic metabolic panel unremarkable. Hemoglobin lower this morning 7.4. Platelets 143.  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 difficile, doubt acute colitis  As she reports no history of recurrent PE, her first PE was in the context of surgery and she has an IVC filter in place agree with discontinuing Coumadin. Could consider discontinuing altogether in consultation with primary care physician.  Summary: 72 year old woman presented with rectal bleeding for the last 2 months, intermittent in severity. In the emergency department she was noted to be anemic and referred for admission.  PMH includes:  History of pulmonary embolism in the context of lumbar spine surgery 2010 with no recurrence. IVC filter was placed at that time.   Paraplegia secondary complications of surgery  Diabetes mellitus  Brendia Sacks, MD [facility] [phone]

## 2013-01-22 NOTE — Progress Notes (Signed)
[facility] PROGRESS NOTE  [name] [name] UUV:[id number] DOB: [birth year] DOA: 01/21/2013 PCP: Louie Boston, MD  Assessment/Plan:   Anemia: symptomatic.  likely gi bleed as pt with hx BRBPR and melena. Hx colitis.Transfusion in process. No active bleeding since admission. Holding coumadin.  Will monitor Hg.  GI bleed: appreciate GI assistance. Vit k per GI. Colonoscopy tomorrow.     DM : diet controlled.  A1c 5.6. Continue SSI. Pt on clears for now due to upcoming colonoscopy  HYPERTENSION: somewhat soft. Holding lasix. Continue BB with parameters. Monitor   History of pulmonary embolism: holding coumadin for now. Vit k per GI.    Peripheral edema, chronic:stable at baseline. Holding lasix due to soft BP and #2.   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 Culture pending. Afebrile non toxic  Acute renal insufficiency: pt with hx acute kidney injury 6 months ago. Creatinine currently mildly elevated. Will monitor closely  Code Status:full Family Communication: pt at bedside Disposition Plan: home when ready   Consultants:  GI  Procedures:  none  Antibiotics:  Rocephin 01/21/13>>>  HPI/Subjective: Reports feeling no better or worse.   Objective: Filed Vitals:   01/22/13 0639 01/22/13 0642 01/22/13 1300 01/22/13 1340  BP: 133/73 127/71 83/52 91/59   Pulse: 90  76 73  Temp: 98.5 F (36.9 C)  98.1 F (36.7 C) 98.5 F (36.9 C)  TempSrc: Oral  Oral Oral  Resp: 15 12 18 18   Height:      Weight:      SpO2: 90% 85%      Intake/Output Summary (Last 24 hours) at 01/22/13 1458 Last data filed at 01/22/13 0447  Gross per 24 hour  Intake  427.5 ml  Output      0 ml  Net  427.5 ml   Filed Weights   01/21/13 2237 01/22/13 0618  Weight: 113.1 kg (249 lb 5.4 oz) 113 kg (249 lb 1.9 oz)    Exam:   General:  Obese pale weak  appearing  Cardiovascular: RRR +murmur no gallup 2+ LE edema  Respiratory: normal effort BS with good air flow fine crackles bases no wheeze no rhonchi  Abdomen: morbidly obese soft mild tenderness to palpation particularly lower quadrants. +BS  Musculoskeletal: no clubbing or cyanosis  Data Reviewed: Basic Metabolic Panel:  Recent Labs Lab 01/21/13 1905 01/22/13 0549  NA 136 137  K 4.0 3.8  CL 99 101  CO2 32 30  GLUCOSE 113* 99  BUN 12 12  CREATININE 1.11* 1.12*  CALCIUM 8.4 8.1*   Liver Function Tests:  Recent Labs Lab 01/21/13 1905  AST 23  ALT 9  ALKPHOS 130*  BILITOT 0.5  PROT 6.0  ALBUMIN 2.6*   No results found for this basename: LIPASE, AMYLASE,  in the last 168 hours No results found for this basename: AMMONIA,  in the last 168 hours CBC:  Recent Labs Lab 01/21/13 1905 01/22/13 0549  WBC 9.5 10.7*  NEUTROABS 6.7  --   HGB 8.3* 7.4*  HCT 26.5* 24.3*  MCV 82.8 83.8  PLT 173 143*   Cardiac Enzymes: No results found for this basename: CKTOTAL, CKMB, CKMBINDEX, TROPONINI,  in the last 168 hours BNP (last 3 results)  Recent Labs  08/04/12 1539 08/06/12 0940  PROBNP 8892.0* 6684.0*   CBG:  Recent Labs Lab 01/22/13 0202 01/22/13 0745 01/22/13 1143  GLUCAP  87 91 84    No results found for this or any previous visit (from the past 240 hour(s)).   Studies: No results found.  Scheduled Meds: . antiseptic oral rinse  15 mL Mouth Rinse BID  . cefTRIAXone (ROCEPHIN)  IV  1 g Intravenous QHS  . [START ON 01/24/2013] fentaNYL  100 mcg Transdermal Q72H  . [START ON 01/23/2013] fluconazole (DIFLUCAN) IV  200 mg Intravenous Q24H  . insulin aspart  0-5 Units Subcutaneous QHS  . insulin aspart  0-9 Units Subcutaneous TID WC  . levothyroxine  25 mcg Oral QAC breakfast  . metoprolol tartrate  12.5 mg Oral BID  . polyethylene glycol-electrolytes  4,000 mL Oral Once  . sodium chloride  3 mL Intravenous Q12H   Continuous Infusions: . 0.9 % NaCl  with KCl 20 mEq / L 50 mL/hr at 01/22/13 0202    Principal Problem:   Anemia Active Problems:   DM   HYPERTENSION   History of pulmonary embolism   Peripheral edema   GI bleed   Chronic back pain   Paraplegia following spinal cord injury   UTI (urinary tract infection)    Time spent: 30 minutes    [facility] M  [facility] Pager [phone]. If 7PM-7AM, please contact night-coverage at [email or web address], password [facility] 01/22/2013, 2:58 PM  LOS: 1 day

## 2013-01-22 NOTE — Progress Notes (Signed)
Notified Dr. Irene Limbo of patient running a 13 beat run of v. Tach.  The patient did not c/o pain of any type at this time.  She was resting.  I voiced to the MD that the patient had recently completed her bath.  He stated to continue to monitor her and new orders were placed.

## 2013-01-22 NOTE — Care Management Note (Signed)
    Page 1 of 1   01/25/2013     2:17:37 PM   CARE MANAGEMENT NOTE 01/25/2013  Patient:  RIO, TABER   Account Number:  [id number]  Date Initiated:  01/22/2013  Documentation initiated by:  Sharrie Rothman  Subjective/Objective Assessment:   Pt admitted from home with gi bleeding and anemia. Pt lives with her husband and has a CNA (her granddaughter) that comes M-F , 10-5. Pt has a cane, walker, w/c, hospital bed, and BSC for home use. No HH at this time.     Action/Plan:   Will continue to follow for [facility] needs.   Anticipated DC Date:  01/24/2013   Anticipated DC Plan:  HOME W HOME HEALTH SERVICES      DC Planning Services  CM consult      Choice offered to / List presented to:          [facility] arranged  HH-1 RN      [facility] agency  [facility]   Status of service:  Completed, signed off Medicare Important Message given?  YES (If response is "NO", the following Medicare IM given date fields will be blank) Date Medicare IM given:  01/25/2013 Date Additional Medicare IM given:    Discharge Disposition:  HOME W HOME HEALTH SERVICES  Per UR Regulation:    If discussed at Long Length of Stay Meetings, dates discussed:    Comments:  01/22/13 1300 Arlyss Queen, RN BSN CM 01/25/13 Yaritzel Stange Leanord Hawking RN BSN CM

## 2013-01-22 NOTE — Progress Notes (Signed)
UR chart review completed.  

## 2013-01-22 NOTE — Consult Note (Addendum)
Referring Provider: Standley Brooking, MD Primary Care Physician:  Louie Boston, MD Primary Gastroenterologist:  Roetta Sessions, MD  Reason for Consultation:  Rectal bleeding  HPI: [name] [name] is a 72 y.o. 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 Presented to the hospital yesterday because of increased weakness, shortness of breath, dizziness. Bleeding described as sometimes heavy but other times very light. She thought it was from her hemorrhoids. Sometimes the stool is dark but she takes iron supplements. Because she was very pale in appearance she was recommended to come to emergency department by her home health nurse.  In the emergency department she was found to have a hemoglobin 8.3. Back in December 2013 her hemoglobin is anywhere from upper 8 to 10 range. Her INR was 1.78. BUN 12, creatinine 1.11. UA consistent with urinary tract infection. This morning her hemoglobin is 7.4. This morning her INR is 1.76. Her Coumadin has been held.   Past medical history pertinent for hospitalization in December 2013 she presented with bloody diarrhea associated with abdominal cramping. CT showed colitis of the descending and transverse colon. She was treated empirically for Flagyl. C. difficile was negative. There was plan for outpatient colonoscopy. Reportedly had a colonoscopy at [facility] about 2 years ago and had no polyps. But had polyps on two prior colonoscopies. Saw Dr. Teena Dunk two days ago but stated he could not do anything for her and told her her hemorrhoids needed to be addressed. Has appointment to see surgeon in [place] next week.   States her BMs are usually loose to constipation. Usually more diarrhea. May have at least 3-4 stools. Urgency. Some bowel/bladder control even with spinal injury but still has some  incontinence at times. Some vague pain in the stomach. Blood in stool for two months. With most BMs. May be in pull-up.  Fresh blood, occasional blood clots. No melena but stools dark on iron. Stay nauseated. Rare vomiting. Last one month, wake up with "frog in throat". Right much heartburn even on prilosec. Some dysphagia to pills and solid food but "not bad". No ASA/NSAIDs. None since 06/2012. RLQ pain.   Prior to Admission medications   Medication Sig Start Date End Date Taking? Authorizing Provider  ALPRAZolam Prudy Feeler) 0.5 MG tablet Take 0.5 mg by mouth 3 (three) times daily as needed. For anxiety   Yes Historical Provider, MD  fentaNYL (DURAGESIC - DOSED MCG/HR) 100 MCG/HR Place 1 patch onto the skin every 3 (three) days.   Yes Historical Provider, MD  ferrous sulfate 325 (65 FE) MG tablet Take 325 mg by mouth daily with breakfast.   Yes Historical Provider, MD  furosemide (LASIX) 20 MG tablet Take 20 mg by mouth daily.   Yes Historical Provider, MD  levothyroxine (SYNTHROID, LEVOTHROID) 25 MCG tablet Take 25 mcg by mouth daily.   Yes Historical Provider, MD  metoprolol tartrate (LOPRESSOR) 25 MG tablet Take 12.5 mg by mouth 2 (two) times daily.   Yes Historical Provider, MD  Multiple Vitamin (MULTIVITAMIN) tablet Take 1 tablet by mouth daily.     Yes Historical Provider, MD  omeprazole (PRILOSEC) 40 MG capsule Take 40 mg by mouth daily.   Yes Historical Provider, MD  oxyCODONE (OXY IR/ROXICODONE) 5 MG immediate release tablet Take 5 mg by mouth every 4 (four) hours as needed for pain.   Yes Historical Provider,  MD  potassium chloride (K-DUR) 10 MEQ tablet Take 10 mEq by mouth daily.   Yes Historical Provider, MD  vitamin B-12 (CYANOCOBALAMIN) 1000 MCG tablet Take 1,000 mcg by mouth daily.   Yes Historical Provider, MD  warfarin (COUMADIN) 3 MG tablet Take 3 mg by mouth daily.   Yes Historical Provider, MD    Current Facility-Administered Medications  Medication Dose Route Frequency Provider  Last Rate Last Dose  . 0.9 % NaCl with KCl 20 mEq/ L  infusion   Intravenous Continuous Vania Rea, MD 50 mL/hr at 01/22/13 0202    . acetaminophen (TYLENOL) tablet 650 mg  650 mg Oral Q4H PRN Vania Rea, MD      . ALPRAZolam Prudy Feeler) tablet 0.5 mg  0.5 mg Oral TID PRN Vania Rea, MD   0.5 mg at 01/22/13 0203  . antiseptic oral rinse (BIOTENE) solution 15 mL  15 mL Mouth Rinse BID Standley Brooking, MD   15 mL at 01/22/13 0200  . cefTRIAXone (ROCEPHIN) 1 g in dextrose 5 % 50 mL IVPB  1 g Intravenous QHS Vania Rea, MD   1 g at 01/22/13 0327  . [START ON 01/24/2013] fentaNYL (DURAGESIC - dosed mcg/hr) 100 mcg  100 mcg Transdermal Q72H Vania Rea, MD      . Melene Muller ON 01/23/2013] fluconazole (DIFLUCAN) IVPB 200 mg  200 mg Intravenous Q24H Vania Rea, MD      . insulin aspart (novoLOG) injection 0-5 Units  0-5 Units Subcutaneous QHS Vania Rea, MD      . insulin aspart (novoLOG) injection 0-9 Units  0-9 Units Subcutaneous TID WC Vania Rea, MD      . levothyroxine (SYNTHROID, LEVOTHROID) tablet 25 mcg  25 mcg Oral QAC breakfast Vania Rea, MD      . metoprolol tartrate (LOPRESSOR) tablet 12.5 mg  12.5 mg Oral BID Vania Rea, MD   12.5 mg at 01/22/13 0202  . ondansetron (ZOFRAN) injection 4 mg  4 mg Intravenous Q4H PRN Vania Rea, MD      . oxyCODONE (Oxy IR/ROXICODONE) immediate release tablet 5 mg  5 mg Oral Q4H PRN Vania Rea, MD   5 mg at 01/22/13 0654  . sodium chloride 0.9 % injection 3 mL  3 mL Intravenous Q12H Vania Rea, MD      . traZODone (DESYREL) tablet 50 mg  50 mg Oral QHS PRN Vania Rea, MD        Allergies as of 01/21/2013 - Review Complete 01/21/2013  Allergen Reaction Noted  . Bee venom  07/16/2012  . Codeine Palpitations 10/24/2009    Past Medical History  Diagnosis Date  . Other chest pain   . Other chronic pain   . Esophageal reflux   . Unspecified essential hypertension   . Personal  history of pulmonary embolism   . Myalgia and myositis, unspecified   . Coronary atherosclerosis of native coronary artery     CATHETERIZATION X 3  . Dysthymic disorder   . Morbid obesity   . Type II or unspecified type diabetes mellitus without mention of complication, not stated as uncontrolled   . Fibromyalgia   . GERD (gastroesophageal reflux disease)   . Anxiety   . Myocardial infarction     Past Surgical History  Procedure Laterality Date  . Back surgery      X 2 FOR DISK PROBLEMS. three total back surgery  . Abdominal hysterectomy    . Gastric bypass      HISTORY OF  . Carpal  tunnel release      bilateral  . Bladder suspension    . Cholecystectomy    . Colonoscopy      Three, h/o polyps. last one ?2 years ago Dr. Teena Dunk  . Esophagogastroduodenoscopy      remote    Family History  Problem Relation Age of Onset  . Coronary artery disease      FAMILY HISTORY  . Diabetes type II Mother   . Heart disease Mother   . Lymphoma Father   . Colon cancer Sister     late 5s    History   Social History  . Marital Status: Married    Spouse Name: SAMUEL    Number of Children: 2  . Years of Education: N/A   Occupational History  . RETIRED    Social History Main Topics  . Smoking status: Never Smoker   . Smokeless tobacco: Never Used  . Alcohol Use: No  . Drug Use: No  . Sexually Active: Not on file   Other Topics Concern  . Not on file   Social History Narrative  . No narrative on file     ROS:  General: Negative for anorexia, weight loss, fever, chills, fatigue, weakness. Eyes: Negative for vision changes.  ENT: + for hoarseness, difficulty swallowing. No nasal congestion. CV: Negative for chest pain, angina, palpitations, dyspnea on exertion, peripheral edema.  Respiratory: Negative for dyspnea at rest, dyspnea on exertion, cough, sputum, wheezing.  GI: See history of present illness. GU:  Negative for hematuria. +dysuria, urinary incontinence.  MS:  Negative for joint pain. + low back pain.  Derm: Negative for rash or itching.  Neuro: Negative for NEW weakness, abnormal sensation, seizure, frequent headaches, memory loss, confusion. Chronic LE weakness. Able to transfer herself to wheelchair. Psych: Negative for anxiety, depression, suicidal ideation, hallucinations.  Endo: Negative for unusual weight change.  Heme: Negative for bruising or bleeding. Allergy: Negative for rash or hives.       Physical Examination: Vital signs in last 24 hours: Temp:  [97.4 F (36.3 C)-98.6 F (37 C)] 98.5 F (36.9 C) (06/13 0639) Pulse Rate:  [78-97] 90 (06/13 0639) Resp:  [12-16] 12 (06/13 0642) BP: (92-133)/(56-73) 127/71 mmHg (06/13 0642) SpO2:  [85 %-99 %] 85 % (06/13 0642) Weight:  [249 lb 1.9 oz (113 kg)-249 lb 5.4 oz (113.1 kg)] 249 lb 1.9 oz (113 kg) (06/13 0618) Last BM Date: 01/20/13  General: Well-nourished, well-developed, obese in no acute distress.  Head: Normocephalic, atraumatic.   Eyes: Conjunctiva pale, no icterus. Mouth: Oropharyngeal mucosa moist and pink , no lesions erythema or exudate. Neck: Supple without thyromegaly, masses, or lymphadenopathy.  Lungs: Clear to auscultation bilaterally.  Heart: Regular rate and rhythm, no murmurs rubs or gallops.  Abdomen: Bowel sounds are normal, mild diffuse tenderness, nondistended, no hepatosplenomegaly or masses, no abdominal bruits or    hernia , no rebound or guarding.   Rectal: not performed Extremities: No lower extremity edema, clubbing, deformity.  Neuro: Alert and oriented x 4 , grossly normal neurologically.  Skin: Warm and dry, no rash or jaundice.   Psych: Alert and cooperative, normal mood and affect.        Intake/Output from previous day: 06/12 0701 - 06/13 0700 In: 427.5 [P.O.:240; I.V.:137.5; IV Piggyback:50] Out: -  Intake/Output this shift:    Lab Results: CBC  Recent Labs  01/21/13 1905 01/22/13 0549  WBC 9.5 10.7*  HGB 8.3* 7.4*  HCT 26.5* 24.3*   MCV 82.8  83.8  PLT 173 143*   BMET  Recent Labs  01/21/13 1905 01/22/13 0549  NA 136 137  K 4.0 3.8  CL 99 101  CO2 32 30  GLUCOSE 113* 99  BUN 12 12  CREATININE 1.11* 1.12*  CALCIUM 8.4 8.1*   LFT  Recent Labs  01/21/13 1905  BILITOT 0.5  ALKPHOS 130*  AST 23  ALT 9  PROT 6.0  ALBUMIN 2.6*   No results found for this basename: LIPASE    PT/INR  Recent Labs  01/21/13 1905 01/22/13 0632  LABPROT 20.1* 19.9*  INR 1.78* 1.76*    OLD CT A/P without contrast 07/2012 Imaging Studies: No results found.Pierre.Alas week] IMPRESSION:  1. Small bilateral pleural effusions with associated atelectasis.  2. Low attenuation of the intravascular compartment suggesting  anemia.  3. Ascending and transverse colitis with mural thickening.  Differential considerations include infection, inflammatory bowel  disease and ischemia. This includes C difficile colitis.  4. Uncomplicated postoperative changes of gastric bypass.  5. Mild ectasia of the right ureter which can be associated with  ascending urinary tract infection or due to peristaltic  contraction; correlation with urinalysis recommended.  6. Cholecystectomy. IVC filter. Hysterectomy.  7. No interval change and retroperitoneal and left iliac  adenopathy. The cause is unknown. Hematalogic malignancy cannot  be excluded.  8. Small amount of ascites. Also had splenomegaly.    Impression: 72 y/o female on chronic coumadin therapy who presents with complaints of fatigue, weakness in setting of chronic rectal bleeding. Bleeding frequently over the past several months. 07/2012 she had colitis of ascending and trv colon. C.diff negative at that time. Last TCS over two years ago but asymptomatic at that time. She may have significant anorectal bleeding in setting of coumadin but given diarrhea, colitis history, she would likely benefit from colonoscopy to rule out other causes.   She has some breakthrough heartburn, dysphagia.  Consider EGD/ED while off coumadin.   Plan: 1. Hold on FFP given INR at reasonable value. I discussed with Wandra Mannan, RN. No bleeding noted today. 2. Give vitamin K 2mg  po now. 3. Agree with prbcs. 4. Colonoscopy tomorrow. Given dysphagia/nausea/poorly controlled GERD will plan on EGD+/-ED as well given opportunity as she is off coumadin. 5. Obtain medical records from [facility].   LOS: 1 day   Tana Coast  01/22/2013, 9:14 AM  Addendum: Patient apprehensive about EGD due to her prior gastric bypass. States Dr. Juluis Mire told her to not have one done. She has had one EGD since her surgery per her report. Dr. Jena Gauss to discuss further with patient.   Attending note: Patient seen and examined. Agree with need for a diagnostic colonoscopy. With breakthrough reflux symptoms and esophageal dysphagia, her upper GI tract needs evaluation as well.  If colonoscopy negative, evaluation of her upper GI tract becomes even more important. Patient is not sure that she wants her upper GI tract evaluated at this time. I talked about the risks benefits and alternatives of both colonoscopy and EGD with her and her family at some length at the bedside.  She certainly agreeable for Dr. Olga Coaster to perform a colonoscopy tomorrow morning. However, she wants to think about a diagnostic/therapeutic EGD a little more between now and tomorrow morning.

## 2013-01-22 NOTE — Progress Notes (Signed)
During orthostatic vital signs, patient's oxygen stat went to 85% on room air when standing.  Patient stated she was slightly out of breath. Will report this off to day nurse.

## 2013-01-23 ENCOUNTER — Encounter (HOSPITAL_COMMUNITY): Payer: Self-pay | Admitting: *Deleted

## 2013-01-23 ENCOUNTER — Encounter (HOSPITAL_COMMUNITY)
Admission: EM | Disposition: A | Discharge status: Home Health | Payer: Self-pay | Source: Home / Self Care | Attending: Family Medicine

## 2013-01-23 DIAGNOSIS — Z7901 Long term (current) use of anticoagulants: Secondary | ICD-10-CM

## 2013-01-23 DIAGNOSIS — K449 Diaphragmatic hernia without obstruction or gangrene: Secondary | ICD-10-CM

## 2013-01-23 DIAGNOSIS — K922 Gastrointestinal hemorrhage, unspecified: Secondary | ICD-10-CM

## 2013-01-23 DIAGNOSIS — D649 Anemia, unspecified: Secondary | ICD-10-CM

## 2013-01-23 DIAGNOSIS — D62 Acute posthemorrhagic anemia: Secondary | ICD-10-CM

## 2013-01-23 DIAGNOSIS — K644 Residual hemorrhoidal skin tags: Secondary | ICD-10-CM

## 2013-01-23 HISTORY — PX: COLONOSCOPY, ESOPHAGOGASTRODUODENOSCOPY (EGD) AND ESOPHAGEAL DILATION: SHX5781

## 2013-01-23 LAB — TYPE AND SCREEN
ABO/RH(D): A POS
Unit division: 0

## 2013-01-23 LAB — GLUCOSE, CAPILLARY: Glucose-Capillary: 94 mg/dL (ref 70–99)

## 2013-01-23 LAB — URINE CULTURE

## 2013-01-23 LAB — CBC
HCT: 31.7 % — ABNORMAL LOW (ref 36.0–46.0)
Hemoglobin: 9.9 g/dL — ABNORMAL LOW (ref 12.0–15.0)
WBC: 12.5 10*3/uL — ABNORMAL HIGH (ref 4.0–10.5)

## 2013-01-23 LAB — PROTIME-INR: INR: 1.7 — ABNORMAL HIGH (ref 0.00–1.49)

## 2013-01-23 LAB — BASIC METABOLIC PANEL
BUN: 11 mg/dL (ref 6–23)
CO2: 31 mEq/L (ref 19–32)
Chloride: 100 mEq/L (ref 96–112)
Glucose, Bld: 105 mg/dL — ABNORMAL HIGH (ref 70–99)
Potassium: 4.3 mEq/L (ref 3.5–5.1)

## 2013-01-23 SURGERY — COLONOSCOPY, ESOPHAGOGASTRODUODENOSCOPY (EGD) AND ESOPHAGEAL DILATION (ED)
Anesthesia: Moderate Sedation

## 2013-01-23 MED ORDER — MEPERIDINE HCL 50 MG/ML IJ SOLN
INTRAMUSCULAR | Status: DC | PRN
  Administered 2013-01-23: 25 mg via INTRAVENOUS

## 2013-01-23 MED ORDER — BUTAMBEN-TETRACAINE-BENZOCAINE 2-2-14 % EX AERO
INHALATION_SPRAY | CUTANEOUS | Status: DC | PRN
  Administered 2013-01-23: 2 via TOPICAL

## 2013-01-23 MED ORDER — PANTOPRAZOLE SODIUM 40 MG PO TBEC
40.0000 mg | DELAYED_RELEASE_TABLET | Freq: Every day | ORAL | Status: DC
  Administered 2013-01-23 – 2013-01-25 (×3): 40 mg via ORAL
  Filled 2013-01-23 (×3): qty 1

## 2013-01-23 MED ORDER — MIDAZOLAM HCL 5 MG/5ML IJ SOLN
INTRAMUSCULAR | Status: AC
  Filled 2013-01-23: qty 10

## 2013-01-23 MED ORDER — MEPERIDINE HCL 50 MG/ML IJ SOLN
INTRAMUSCULAR | Status: AC
  Filled 2013-01-23: qty 1

## 2013-01-23 MED ORDER — MIDAZOLAM HCL 5 MG/5ML IJ SOLN
INTRAMUSCULAR | Status: DC | PRN
  Administered 2013-01-23: 2 mg via INTRAVENOUS
  Administered 2013-01-23: 1 mg via INTRAVENOUS
  Administered 2013-01-23: 2 mg via INTRAVENOUS
  Administered 2013-01-23: 1 mg via INTRAVENOUS
  Administered 2013-01-23 (×2): 2 mg via INTRAVENOUS

## 2013-01-23 MED ORDER — STERILE WATER FOR IRRIGATION IR SOLN
Status: DC | PRN
  Administered 2013-01-23: 13:00:00

## 2013-01-23 NOTE — Progress Notes (Signed)
To ENDO for procedure.

## 2013-01-23 NOTE — Op Note (Signed)
COLONOSCOPY AND EGD PROCEDURE REPORT  PATIENT:  [name] [name]  MR#:  [id number] Birthdate:  Apr 04, 1941, 72 y.o., female Endoscopist:  Dr. Malissa Hippo, MD Procedure Date: 01/23/2013  Procedure:  Colonoscopy followed by EGD.  Indications:  Patient is 72 year old Caucasian female with multiple medical problems who is chronically anticoagulated who presents with intermittent rectal bleeding despite to be large volume at times. Admission hemoglobin was 8.3 and dropped to 7.4. She has received 2 units of PRBCs her hemoglobin is up to 9.9 g. Her warfarin is on hold. Her INR early this a.m. was 1.7 and probably 1.5 by now. She is undergoing diagnostic colonoscopy followed by EGD if colonoscopy is negative.            Informed Consent:  The risks, benefits, alternatives & imponderables which include, but are not limited to, bleeding, infection, perforation, drug reaction and potential missed lesion have been reviewed.  The potential for biopsy, lesion removal, esophageal dilation, etc. have also been discussed.  Questions have been answered.  All parties agreeable.  Please see history & physical in medical record for more information.   Medications:  Demerol 25 mg IV Versed 10 mg IV Cetacaine spray topically for oropharyngeal anesthesia    COLONOSCOPY Description of procedure:  After a digital rectal exam was performed, that colonoscope was advanced from the anus through the rectum and colon to the area of the cecum, ileocecal valve and appendiceal orifice. The cecum was deeply intubated. These structures were well-seen and photographed for the record. From the level of the cecum and ileocecal valve, the scope was slowly and cautiously withdrawn. The mucosal surfaces were carefully surveyed utilizing scope tip to flexion to facilitate fold flattening as needed. The scope was pulled down into the rectum where a thorough exam including retroflexion was performed.  Findings:   Prep was  satisfactory. Normal mucosa of area segments of colon and rectum. Hemorrhoids noted below the dentate line.  Therapeutic/Diagnostic Maneuvers Performed:  None  Complications:  None  Cecal Withdrawal Time:  15 minutes  EGD   Description of procedure:  The endoscope was introduced through the mouth and advanced to the second portion of the duodenum without difficulty or limitations. The mucosal surfaces were surveyed very carefully during advancement of the scope and upon withdrawal.  Findings:  Esophagus:  Mucosa of the esophagus was normal. Focal erythema noted at the GE junction but no ring or stricture present.small sliding hiatal hernia noted  After she heaved. GEJ:  40 cm Stomach:  Very small gastric pouch with normal mucosa and patent anastomosis without ulceration or friability. Small bowel;  Jejunal mucosa was examined for at least 20 cm and no erosions ulcers or AV malformations noted. Therapeutic/Diagnostic Maneuvers Performed:   None.  Impression:  Colonoscopy findings; External hemorrhoids otherwise normal colonoscopy. EGD findings; Mild changes of esophagitis at GE junction(focal erythema). Small gastric pouch with patent gastrojejunostomy. Normal mucosa of proximal small bowel.   Recommendations:  Advance diet. Pantoprazole 40 mg by mouth every morning. Small bowel given capsule study. H&H in a.m.   [name] U  01/23/2013 2:34 PM  CC: Dr. Louie Boston, MD & Dr. Bonnetta Barry ref. provider found CC Dr. Roetta Sessions, MD

## 2013-01-23 NOTE — Progress Notes (Signed)
[facility] PROGRESS NOTE  [name] [name] GNF:[id number] DOB: 1940-08-22 DOA: 01/21/2013 PCP: Louie Boston, MD  Assessment/Plan: 1. Acute on chronic rectal bleeding: Etiology unclear. Upper and lower endoscopy unremarkable except for external hemorrhoids. Continue PPI. Small bowel capsule study per gastroenterology. 2. Acute on chronic blood loss anemia: Improved status post 2 units packed red blood cells. 3. Chronic warfarin therapy for history of pulmonary embolism 2010: This occurred in the context of surgery and there has been no recurrence. Additionally she has an IVC filter in place. Given these facts it is reasonable to discontinue warfarin altogether. 4. Urinary tract infection: E. Coli. Final results pending. Continue ceftriaxone. 5. Diabetes mellitus, diet controlled: Blood sugars well-controlled. Discontinue capillary blood sugar checks and sliding scale insulin. 6. History paraplegia following surgery in the past   Monitor hemoglobin.  Small bowel capsule study per GI.  Code Status: Full code DVT prophylaxis: SCDs Family Communication: Discussed with daughter at bedside Disposition Plan: Home when improved  Brendia Sacks, MD  [facility]  Pager [phone] If 7PM-7AM, please contact night-coverage at [email or web address], password [facility] 01/23/2013, 3:39 PM  LOS: 2 days   Clinical Summary: 72 year old woman presented with rectal bleeding for the last 2 months, intermittent in severity. In the emergency department she was noted to be anemic and referred for admission.  Consultants:  Gastroenterology  Procedures:  EGD: Mild changes of esophagitis at GE junction(focal erythema). Small gastric pouch with patent gastrojejunostomy. Normal mucosa of proximal small bowel.  Colonoscopy: External hemorrhoids otherwise normal.  Antibiotics: Ceftriaxone 6/13 >>   HPI/Subjective: Throat somewhat sore but otherwise doing okay. Abdomen little sore. Nurse reported 13  beat run of V. tach.  Objective: Filed Vitals:   01/23/13 1425 01/23/13 1430 01/23/13 1435 01/23/13 1507  BP: 82/36 103/38 104/48 109/53  Pulse: 72 71  73  Temp:    97.7 F (36.5 C)  TempSrc:    Oral  Resp: 13 12  14   Height:      Weight:      SpO2: 100% 100%  100%    Intake/Output Summary (Last 24 hours) at 01/23/13 1539 Last data filed at 01/23/13 0500  Gross per 24 hour  Intake    240 ml  Output   1850 ml  Net  -1610 ml     Filed Weights   01/21/13 2237 01/22/13 0618 01/23/13 0500  Weight: 113.1 kg (249 lb 5.4 oz) 113 kg (249 lb 1.9 oz) 121.5 kg (267 lb 13.7 oz)    Exam:   Appears calm and comfortable.  Cardiovascular regular rate and rhythm. 2/6 systolic murmur. No rub or gallop. No significant lower extremity edema  Telemetry sinus rhythm, no arrhythmias. I do not see any strips with arrhythmias. I do not see evidence of the reported as regular tachycardia yesterday.  Respiratory clear to auscultation bilaterally. No wheezes, rales, rhonchi. Normal respiratory effort.  Abdomen soft, nontender, nondistended.  Data Reviewed:  Magnesium normal. INR 1.70. Basic metabolic panel without significant change. Hemoglobin improved to 9.9.   Scheduled Meds: . antiseptic oral rinse  15 mL Mouth Rinse BID  . cefTRIAXone (ROCEPHIN)  IV  1 g Intravenous QHS  . [START ON 01/24/2013] fentaNYL  100 mcg Transdermal Q72H  . fluconazole (DIFLUCAN) IV  200 mg Intravenous Q24H  . insulin aspart  0-5 Units Subcutaneous QHS  . insulin aspart  0-9 Units Subcutaneous TID WC  . levothyroxine  25 mcg Oral QAC breakfast  . meperidine      . metoprolol  tartrate  12.5 mg Oral BID  . midazolam      . pantoprazole  40 mg Oral Daily  . sodium chloride  3 mL Intravenous Q12H   Continuous Infusions: . 0.9 % NaCl with KCl 20 mEq / L 50 mL/hr at 01/22/13 0202    Principal Problem:   Anemia Active Problems:   DM   HYPERTENSION   History of pulmonary embolism   Peripheral edema    GI bleed   Chronic back pain   Paraplegia following spinal cord injury   UTI (urinary tract infection)   Time spent 20 minutes

## 2013-01-23 NOTE — Progress Notes (Signed)
Sleeping quietly. Continues waiting to go to surgery.

## 2013-01-23 NOTE — Progress Notes (Signed)
In PACU for ENDO holding. Awake. Oriented. Voices no C/O at this time. Son at bedside.

## 2013-01-23 NOTE — Progress Notes (Signed)
1000 ml normal saline added to present IV site and infusing at 100 ml/hr. IV site LFA #22.

## 2013-01-24 ENCOUNTER — Encounter (HOSPITAL_COMMUNITY)
Admission: EM | Disposition: A | Discharge status: Home Health | Payer: Self-pay | Source: Home / Self Care | Attending: Family Medicine

## 2013-01-24 DIAGNOSIS — Z86711 Personal history of pulmonary embolism: Secondary | ICD-10-CM

## 2013-01-24 HISTORY — PX: GIVENS CAPSULE STUDY: SHX5432

## 2013-01-24 LAB — CBC
HCT: 30.1 % — ABNORMAL LOW (ref 36.0–46.0)
MCHC: 30.6 g/dL (ref 30.0–36.0)
MCV: 85.8 fL (ref 78.0–100.0)
RDW: 18.2 % — ABNORMAL HIGH (ref 11.5–15.5)
WBC: 11.4 10*3/uL — ABNORMAL HIGH (ref 4.0–10.5)

## 2013-01-24 SURGERY — IMAGING PROCEDURE, GI TRACT, INTRALUMINAL, VIA CAPSULE

## 2013-01-24 NOTE — Progress Notes (Addendum)
[facility] PROGRESS NOTE  [name] [name] ZOX:[id number] DOB: [birth year] DOA: 01/21/2013 PCP: Louie Boston, MD Gastroenterologist: Dr. Kendell Bane  Assessment/Plan: 1. Acute on chronic rectal bleeding: May be hemorrhoidal in nature. Upper and lower endoscopy were unremarkable except for external hemorrhoids. Small bowel capsule study per gastroenterology. Continue PPI. Small bowel capsule to be placed today. 2. Acute on chronic blood loss anemia: Appears stable status post 2 units packed red blood cells. 3. Chronic warfarin therapy for history of pulmonary embolism 2010: This occurred in the context of surgery and by patient report there has been no recurrence. Additionally she has an IVC filter in place. Given these facts and blood loss requiring transfusion warfarin has been discontinued altogether. I discussed this with her and recommend she followup with her primary care physician to ascertain whether Coumadin needs to be started in the future. Based on the facts available it would be reasonable to consider her provoked VTE treated and the benefit of continued anticoagulation outweighed by the risks. 4. Urinary tract infection Escherichia coli. Complete Rocephin today. 5. Diabetes mellitus, diet controlled: Stable. 6. History paraplegia following surgery in the past   Wean oxygen  Discontinuing telemetry  Administer capsule per gastroenterology  Discharge home anticipated 6/16  Code Status: Full code DVT prophylaxis: SCDs Family Communication: None present Disposition Plan: As above  Brendia Sacks, MD  [facility]  Pager [phone] If 7PM-7AM, please contact night-coverage at [email or web address], password [facility] 01/24/2013, 11:14 AM  LOS: 3 days   Clinical Summary: 72 year old woman presented with rectal bleeding for the last 2 months, intermittent in severity. In the emergency department she was noted to be anemic and referred for  admission.  Consultants:  Gastroenterology  Procedures:  EGD: Mild changes of esophagitis at GE junction(focal erythema). Small gastric pouch with patent gastrojejunostomy. Normal mucosa of proximal small bowel.  Colonoscopy: External hemorrhoids otherwise normal.  Small bowel capsule endoscopy pending  Antibiotics: Ceftriaxone 6/13 >> 6/14 Keflex 6/14 >> 6/15  HPI/Subjective: No bleeding that she is aware of. Overall feels okay without any new complaints. Set to undergo small bowel capsule endoscopy.  Objective: Filed Vitals:   01/24/13 0200 01/24/13 0241 01/24/13 0637 01/24/13 0900  BP: 95/38 90/55 97/43  95/45  Pulse: 69 72 75 75  Temp: 98.3 F (36.8 C)  97.8 F (36.6 C) 97.9 F (36.6 C)  TempSrc: Oral  Oral Oral  Resp: 15  16 16   Height:      Weight:   122.1 kg (269 lb 2.9 oz)   SpO2: 100%  99% 100%    Intake/Output Summary (Last 24 hours) at 01/24/13 1114 Last data filed at 01/23/13 2001  Gross per 24 hour  Intake    240 ml  Output   1050 ml  Net   -810 ml     Filed Weights   01/22/13 0618 01/23/13 0500 01/24/13 0637  Weight: 113 kg (249 lb 1.9 oz) 121.5 kg (267 lb 13.7 oz) 122.1 kg (269 lb 2.9 oz)    Exam:   In general appears calm and comfortable.  Cardiovascular regular rate and rhythm. 1/6 systolic murmur. No rub or gallop. No significant lower extremity edema.  Telemetry: Sinus rhythm very no arrhythmias  Respiratory clear to auscultation bilaterally. No wheezes, rales, rhonchi. Normal respiratory effort.  Data Reviewed:  Urine culture reviewed. Hemoglobin stable 9.2. INR unchanged 1.73.   Scheduled Meds: . antiseptic oral rinse  15 mL Mouth Rinse BID  . cefTRIAXone (ROCEPHIN)  IV  1 g Intravenous QHS  .  fentaNYL  100 mcg Transdermal Q72H  . fluconazole (DIFLUCAN) IV  200 mg Intravenous Q24H  . insulin aspart  0-5 Units Subcutaneous QHS  . insulin aspart  0-9 Units Subcutaneous TID WC  . levothyroxine  25 mcg Oral QAC breakfast  .  metoprolol tartrate  12.5 mg Oral BID  . pantoprazole  40 mg Oral Daily  . sodium chloride  3 mL Intravenous Q12H   Continuous Infusions:    Principal Problem:   Anemia Active Problems:   DM   HYPERTENSION   History of pulmonary embolism   Peripheral edema   GI bleed   Chronic back pain   Paraplegia following spinal cord injury   UTI (urinary tract infection)   Acute blood loss anemia

## 2013-01-24 NOTE — Progress Notes (Signed)
Patient has no complaints. She denies rectal bleeding or melena. H&H is 9.2 and 30.1 Patient to undergo small bowel given capsule study today. Patient can go home either and the study is completed or earlier if family member will bring recording device back to the hospital. This study would be reviewed tomorrow possibly by Dr. Jena Gauss

## 2013-01-25 DIAGNOSIS — D649 Anemia, unspecified: Secondary | ICD-10-CM

## 2013-01-25 DIAGNOSIS — K922 Gastrointestinal hemorrhage, unspecified: Secondary | ICD-10-CM

## 2013-01-25 NOTE — Progress Notes (Signed)
AVS reviewed with patient and patient's spouse.  Teach back method used.  Pt educated of medications due for remainder of day.  NP notified by pt's recent BP readings and that PB meds had previously been held.  Notified of recent BP readings.  Gave a.m. Dose of Metoprolol.  Pt's IV removed.  Site WNL.  Pt transported by NT via w/c to main entrance for discharge.  Pt stable at time of discharge.

## 2013-01-25 NOTE — Discharge Summary (Signed)
Physician Discharge Summary  [name] [name] UJW:[id number] DOB: 25-Feb-1941 DOA: 01/21/2013  PCP: Louie Boston, MD  Admit date: 01/21/2013 Discharge date: 01/25/2013  Time spent: 30 minutes  Recommendations for Outpatient Follow-up:  1. PCP 1 week for evaluation of symptoms 2. [facility] 1 week schedule follow up and results of capsule endoscopy  Discharge Diagnoses:  Principal Problem:   Anemia Active Problems:   DM   HYPERTENSION   History of pulmonary embolism   Peripheral edema   GI bleed   Chronic back pain   Paraplegia following spinal cord injury   UTI (urinary tract infection)   Acute blood loss anemia   Discharge Condition: stable  Diet recommendation: carb modified  Filed Weights   01/23/13 0500 01/24/13 0637 01/25/13 0443  Weight: 121.5 kg (267 lb 13.7 oz) 122.1 kg (269 lb 2.9 oz) 121.8 kg (268 lb 8.3 oz)    [name] [name] is a 72 y.o. female. 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 The bleeding  sometimes very heavy and sometimes scant. She has known hemorrhoids and assumed this was the cause. Her stool sometimes [name] [name] but she re[prted taking iron tablets. Her home health nurse remarked that she looked very pale and should come to the emergency room to be evaluated. In the emergency room she was noted to be anemic, beyond her baseline.  She gave a history of a colonoscopy about 5 years ago, reported to have a polyp but did not remember having any significant issues; denied a history of diverticulosis.  She had noted increasing dizziness and lightheadedness, and increasing fatigue the previous 2 months.  The nurses of note her urine to be foul-smelling   Hospital Course:  Anemia: symptomatic. Related to chronic rectal bleed. Hg 7.4 on  admission.  Pt received 2 units PRBC's. At discharge Hg 9.2. Recommend follow up with PCP for monitoring.    GI bleed: acute on chronic rectal bleed. Seen by GI.  Upper and lower endoscopy unremarkable except for external hemorrhoids. Continue PPI. S/P small bowel capsule 01/24/13. Results to be reviewed outpatient.     DM : diet controlled. A1c 5.6.    HYPERTENSION: stable.   History of pulmonary embolism: In context of surgerry and by pt report no recurrence. Additionally she has an IVC filter. Given this and #1 and #2 coumadin discontinued. Recommend OP follow up with PCP to ascertain wheterh coumadin needs to be started in future. Issue discussed with patient by Dr Irene Limbo.    Peripheral edema, chronic:stable at baseline.   Chronic back pain: stable at baseline   Paraplegia following spinal cord injury: has help at home.will request PT   UTI (urinary tract infection: [place]. Completed antibiotic therapy.   Acute renal insufficiency: pt with hx acute kidney injury 6 months ago. Creatinine lower than documented baseline at discharge.   Procedures: EGD: Mild changes of esophagitis at GE junction(focal erythema). Small gastric pouch with patent gastrojejunostomy. Normal mucosa of proximal small bowel. Colonoscopy: External hemorrhoids otherwise normal.  Small bowel capsule endoscopy 01/24/13  Consultations:  GI  Discharge Exam: Filed Vitals:   01/25/13 0216 01/25/13 0443 01/25/13 0444 01/25/13 0752  BP: 104/63 102/61 107/67   Pulse: 88 92 93   Temp: 99.3 F (37.4 C) 99.2 F (37.3 C)    TempSrc: Oral Oral  Resp: 16 16 16    Height:      Weight:  121.8 kg (268 lb 8.3 oz)    SpO2: 95% 90% 92% 93%    General: obese NAD Cardiovascular: RRR No MGR no LE edema Respiratory: normal effort BS clear bilaterally no wheeze no rhonchi Abdomen: obese, soft +BS non-tender to palpation.   Discharge Instructions  Discharge Orders   Future Orders Complete By Expires     Call MD for:   persistant nausea and vomiting  As directed     Diet - low sodium heart healthy  As directed     Increase activity slowly  As directed         Medication List    STOP taking these medications       warfarin 3 MG tablet  Commonly known as:  COUMADIN      TAKE these medications       ALPRAZolam 0.5 MG tablet  Commonly known as:  XANAX  Take 0.5 mg by mouth 3 (three) times daily as needed. For anxiety     fentaNYL 100 MCG/HR  Commonly known as:  DURAGESIC - dosed mcg/hr  Place 1 patch onto the skin every 3 (three) days.     ferrous sulfate 325 (65 FE) MG tablet  Take 325 mg by mouth daily with breakfast.     furosemide 20 MG tablet  Commonly known as:  LASIX  Take 20 mg by mouth daily.     levothyroxine 25 MCG tablet  Commonly known as:  SYNTHROID, LEVOTHROID  Take 25 mcg by mouth daily.     metoprolol tartrate 25 MG tablet  Commonly known as:  LOPRESSOR  Take 12.5 mg by mouth 2 (two) times daily.     multivitamin tablet  Take 1 tablet by mouth daily.     omeprazole 40 MG capsule  Commonly known as:  PRILOSEC  Take 40 mg by mouth daily.     oxyCODONE 5 MG immediate release tablet  Commonly known as:  Oxy IR/ROXICODONE  Take 5 mg by mouth every 4 (four) hours as needed for pain.     potassium chloride 10 MEQ tablet  Commonly known as:  K-DUR  Take 10 mEq by mouth daily.     vitamin B-12 1000 MCG tablet  Commonly known as:  CYANOCOBALAMIN  Take 1,000 mcg by mouth daily.       Allergies  Allergen Reactions  . Bee Venom   . Codeine Palpitations    Per patient, "Feels like I'm having a heart attack."       Follow-up Information   Follow up with TAPPER,DAVID B, MD. Schedule an appointment as soon as possible for a visit in 1 week. (evaluation of symptoms)    Contact information:   [address] [place] [address] [phone]       Follow up with [facility]. Call in 1 week. (for discharge follow up appointment)     Contact information:   [address] [place] [address] [phone]       The results of significant diagnostics from this hospitalization (including imaging, microbiology, ancillary and laboratory) are listed below for reference.    Significant Diagnostic Studies: No results found.  Microbiology: Recent Results (from the past 240 hour(s))  URINE CULTURE     Status: None   Collection Time    01/22/13  1:23 AM      Result Value Range Status   Specimen Description URINE, CATHETERIZED  Final   Special Requests NONE   Final   Culture  Setup Time 01/22/2013 03:15   Final   Colony Count >=100,000 COLONIES/ML   Final   Culture ESCHERICHIA COLI   Final   Report Status 01/23/2013 FINAL   Final   Organism ID, Bacteria ESCHERICHIA COLI   Final     Labs: Basic Metabolic Panel:  Recent Labs Lab 01/21/13 1905 01/22/13 0549 01/22/13 2043 01/23/13 0645  NA 136 137  --  137  K 4.0 3.8  --  4.3  CL 99 101  --  100  CO2 32 30  --  31  GLUCOSE 113* 99  --  105*  BUN 12 12  --  11  CREATININE 1.11* 1.12*  --  1.14*  CALCIUM 8.4 8.1*  --  8.3*  MG  --   --  2.3  --    Liver Function Tests:  Recent Labs Lab 01/21/13 1905  AST 23  ALT 9  ALKPHOS 130*  BILITOT 0.5  PROT 6.0  ALBUMIN 2.6*   No results found for this basename: LIPASE, AMYLASE,  in the last 168 hours No results found for this basename: AMMONIA,  in the last 168 hours CBC:  Recent Labs Lab 01/21/13 1905 01/22/13 0549 01/23/13 0645 01/24/13 0645  WBC 9.5 10.7* 12.5* 11.4*  NEUTROABS 6.7  --   --   --   HGB 8.3* 7.4* 9.9* 9.2*  HCT 26.5* 24.3* 31.7* 30.1*  MCV 82.8 83.8 84.8 85.8  PLT 173 143* 167 153   Cardiac Enzymes: No results found for this basename: CKTOTAL, CKMB, CKMBINDEX, TROPONINI,  in the last 168 hours BNP: BNP (last 3 results)  Recent Labs  08/04/12 1539 08/06/12 0940  PROBNP 8892.0* 6684.0*   CBG:  Recent Labs Lab 01/23/13 2142 01/24/13 0719 01/24/13 1132 01/24/13 1623  01/24/13 2008  GLUCAP 94 98 99 131* 109*       Signed:  Chavis Tessler M  [facility] 01/25/2013, 11:52 AM

## 2013-01-25 NOTE — Progress Notes (Signed)
[facility]  [name] [name] ZOX:[id number] DOB: 1941/02/16 DOA: 01/21/2013 PCP: Louie Boston, MD  Interval history/Subjective: No new issues overnight. No bleeding that she is aware of. Chronic abdominal is without change and long-standing. Eating okay.  Objective: Vitals stable. Appears calm and comfortable. Speech fluent and clear. Cardiovascular regular rate and rhythm. Respiratory clear to auscultation bilaterally. Abdomen soft, obese, nontender, nondistended. No significant lower extremity edema.  Labs/studies: Capillary blood sugars stable  Assessment:  Acute on chronic rectal bleeding, likely hemorrhoidal in nature  Acute on chronic blood loss anemia, stable  History of chronic warfarin therapy for pulmonary embolism 2010  Urinary tract infection, treated  Diabetes mellitus, diet controlled: Stable  History paraplegia following surgery in the past  Plan:  Discontinue Coumadin on discharge. This can be further discussed with her primary care physician but it appears reasonable to stop permanently at this time.  Capsule endoscopy will be followed up by gastroenterology  Can resume chronic medications on discharge including Xanax, fentanyl, iron, Lasix, omeprazole  Brendia Sacks, MD [facility] [phone]

## 2013-01-25 NOTE — Progress Notes (Signed)
Subjective: Gastric bypass approximately 30 years ago at [facility]. Reports constant lower abdominal pain for years. No aggravating or relieving factors. States she saw blood clots "when foley was removed".   Objective: Vital signs in last 24 hours: Temp:  [97.5 F (36.4 C)-99.3 F (37.4 C)] 99.2 F (37.3 C) (06/16 0443) Pulse Rate:  [74-93] 93 (06/16 0444) Resp:  [16-18] 16 (06/16 0444) BP: (91-107)/(44-67) 107/67 mmHg (06/16 0444) SpO2:  [90 %-100 %] 93 % (06/16 0752) Weight:  [268 lb 8.3 oz (121.8 kg)] 268 lb 8.3 oz (121.8 kg) (06/16 0443) Last BM Date: 01/23/13 General:   Alert and oriented, pleasant Head:  Normocephalic and atraumatic. Eyes:  No icterus, sclera clear. Conjuctiva pink.  Heart:  S1, S2 present, +systolic murmur Lungs: Clear to auscultation bilaterally, without wheezing, rales, or rhonchi.  Abdomen:  Bowel sounds present, soft, non-tender, non-distended. Obese Extremities:  2+ pedal edema   Intake/Output from previous day: 06/15 0701 - 06/16 0700 In: 390 [P.O.:390] Out: 575 [Urine:575] Intake/Output this shift:    Lab Results:  Recent Labs  01/23/13 0645 01/24/13 0645  WBC 12.5* 11.4*  HGB 9.9* 9.2*  HCT 31.7* 30.1*  PLT 167 153   BMET  Recent Labs  01/23/13 0645  NA 137  K 4.3  CL 100  CO2 31  GLUCOSE 105*  BUN 11  CREATININE 1.14*  CALCIUM 8.3*   PT/INR  Recent Labs  01/23/13 0645 01/24/13 0645  LABPROT 19.4* 19.7*  INR 1.70* 1.73*     Assessment: 72 year old female admitted with rectal bleeding in the setting of Coumadin, as well as dysphagia. EGD with mild esophagitis at GE junction, no evidence of anastomotic ulceration, patent gastrojejunostomy. Colonoscopy with external hemorrhoids. Capsule study to be reviewed today. Patient is stable clinically for discharge with outpatient follow-up. Lower abdominal pain is chronic in nature; differentials could include chronic abdominal pain, adhesive disease, consider GYN evaluation as  outpatient.    Plan: Capsule study to be reviewed today Appropriate for d/c home Outpatient follow-up in our office Signing off.   Nira Retort, ANP-BC [facility]  9:23 AM   LOS: 4 days    01/25/2013, 8:51 AM

## 2013-01-25 NOTE — Discharge Summary (Signed)
Patient seen and examined. Agree with discharge.  Lynn Recendiz, MD [facility] [phone]  

## 2013-01-27 DIAGNOSIS — R1310 Dysphagia, unspecified: Secondary | ICD-10-CM

## 2013-01-27 DIAGNOSIS — K209 Esophagitis, unspecified: Secondary | ICD-10-CM

## 2013-01-27 DIAGNOSIS — D649 Anemia, unspecified: Secondary | ICD-10-CM

## 2013-01-27 NOTE — Procedures (Signed)
  Small Bowel Givens Capsule Study Procedure date:  January 24, 2013  Referring Provider:  Dr. Karilyn Cota  PCP:  Dr. Louie Boston, MD  Indication for procedure:  72 year old female admitted with rectal bleeding in the setting of Coumadin, as well as dysphagia. EGD with mild esophagitis at GE junction, no evidence of anastomotic ulceration, patent gastrojejunostomy. Colonoscopy with external hemorrhoids. No convincing evidence on upper or lower GI evaluation; therefore, capsule study undertaken to rule out small bowel etiology. As of note, patient is status post gastric bypass in the past, so a small portion of the bypassed intestine will not be visualized.     Findings:   The capsule study was complete to the cecum. Possible non-bleeding erosions noted at 29:02, 34:42, 1:29:18. Further areas of erosions/ulcers, non-bleeding, noted at  3:1:54, 5:21:58, and 6:0:21. Starting at 5:58:02 through 5:58:49, a "cobblestone" effect was noted. Debris noted intermittently. No overt evidence of bleeding or mass apparent.    First Cecal image: 6:04:17 Small Bowel Passage time:  6h 61m  Summary & Recommendations: 72 year old female with a history of gastric bypass, presenting with rectal bleeding in the setting of anticoagulation, with EGD and colonoscopy performed as above. Capsule study reviewed without overt evidence of GI bleeding. Will be discussed in more depth with attending physician. Due to post-surgical anatomy, unable to exclude a source of bleed from the small intestine not visualized, as patient had a gastric bypass. Anemia is likely multifactorial in this setting, with malabsorptive state a contributor. Monitor for further rectal bleeding, persistent anemia. Areas of cobblestone effect are non-specific; doubt dealing with IBD in this setting. May want to visit the possibility of further imaging such as CT enterography if further concern exists in future.  Will arrange followup in the office to  reassess.  Nira Retort, ANP-BC [facility]

## 2013-01-28 LAB — PREPARE FRESH FROZEN PLASMA

## 2013-02-01 ENCOUNTER — Encounter (HOSPITAL_COMMUNITY): Payer: Self-pay | Admitting: Internal Medicine

## 2013-02-09 ENCOUNTER — Encounter: Payer: Self-pay | Admitting: Internal Medicine

## 2013-02-10 ENCOUNTER — Encounter: Payer: Self-pay | Admitting: Gastroenterology

## 2013-02-10 ENCOUNTER — Ambulatory Visit (INDEPENDENT_AMBULATORY_CARE_PROVIDER_SITE_OTHER): Payer: Medicaid Other | Admitting: Gastroenterology

## 2013-02-10 ENCOUNTER — Ambulatory Visit: Payer: Medicare Other | Admitting: Gastroenterology

## 2013-02-10 ENCOUNTER — Inpatient Hospital Stay: Payer: Medicare Other | Admitting: Gastroenterology

## 2013-02-10 VITALS — BP 99/62 | HR 97 | Temp 97.4°F | Ht 67.0 in | Wt 242.0 lb

## 2013-02-10 DIAGNOSIS — R399 Unspecified symptoms and signs involving the genitourinary system: Secondary | ICD-10-CM

## 2013-02-10 DIAGNOSIS — R3989 Other symptoms and signs involving the genitourinary system: Secondary | ICD-10-CM

## 2013-02-10 DIAGNOSIS — D649 Anemia, unspecified: Secondary | ICD-10-CM

## 2013-02-10 MED ORDER — HYDROCORTISONE ACETATE 25 MG RE SUPP: 25.0000 mg | Freq: Two times a day (BID) | RECTAL | Status: DC

## 2013-02-10 NOTE — Patient Instructions (Addendum)
Start using Anusol suppositories twice a day for 6 days.   Please have blood work and urine sample completed. We will call when the results are available.  Please call if any worsening of rectal bleeding.  We will see you in 6-8 weeks!

## 2013-02-10 NOTE — Progress Notes (Signed)
Referring Provider: Louie Boston., MD Primary Care Physician:  Louie Boston, MD Primary GI: Dr. Jena Gauss   Chief Complaint  Patient presents with  . Follow-up    swelling, about the same as she was at the hospital    HPI:   Presents today in hospital follow-up secondary to rectal bleeding. TCS/EGD performed during admission with external hemorrhoids noted, no real evidence of source of bleeding. Capsule study revealed no active GI bleeding but a possible ulcer. Possible cobble-stoning effect.   RLQ discomfort, 99% of the time. Intermittent. Ovaries remain. Last GYN eval about 2 years ago. Sometimes exacerbated by movement. Sometimes laying on the side helps ease. Rectal bleeding significantly improved, almost resolved. Lower abdominal burning is chronic, burns with urinating, stretches up to navel. +foul odor. Notes chronic issues with alternating diarrhea and constipation. Cramping prior to loose stool noted. Possible virus over weekend. Notes numbness in her hands.   Past Medical History  Diagnosis Date  . Other chest pain   . Other chronic pain   . Esophageal reflux   . Unspecified essential hypertension   . Personal history of pulmonary embolism   . Myalgia and myositis, unspecified   . Coronary atherosclerosis of native coronary artery     CATHETERIZATION X 3  . Dysthymic disorder   . Morbid obesity   . Type II or unspecified type diabetes mellitus without mention of complication, not stated as uncontrolled   . Fibromyalgia   . GERD (gastroesophageal reflux disease)   . Anxiety   . Myocardial infarction   . TIA (transient ischemic attack)   . Acute kidney injury   . Complication of anesthesia   . PONV (postoperative nausea and vomiting)     Past Surgical History  Procedure Laterality Date  . Back surgery      X 2 FOR DISK PROBLEMS. three total back surgery  . Abdominal hysterectomy    . Gastric bypass      HISTORY OF  . Carpal tunnel release      bilateral  .  Bladder suspension    . Cholecystectomy    . Colonoscopy      Three, h/o polyps. last one ?2 years ago Dr. Teena Dunk  . Esophagogastroduodenoscopy      remote  . Colonoscopy, esophagogastroduodenoscopy (egd) and esophageal dilation N/A 01/23/2013    [id number] hemorrhoids otherwise normal colonoscopy/Mild changes of esophagitis at GE junction(focal erythema).Small gastric pouch with patent gastrojejunostomy.Normal mucosa of proximal small bowel  . Givens capsule study N/A 01/24/2013    Procedure: GIVENS CAPSULE STUDY;  Surgeon: Malissa Hippo, MD;  Location: [facility];  Service: Endoscopy;  Laterality: N/A;    Current Outpatient Prescriptions  Medication Sig Dispense Refill  . ALPRAZolam (XANAX) 0.5 MG tablet Take 0.5 mg by mouth 3 (three) times daily as needed. For anxiety      . citalopram (CELEXA) 20 MG tablet Take 20 mg by mouth daily.       . fentaNYL (DURAGESIC - DOSED MCG/HR) 100 MCG/HR Place 1 patch onto the skin every 3 (three) days.      . ferrous sulfate 325 (65 FE) MG tablet Take 325 mg by mouth daily with breakfast.      . furosemide (LASIX) 20 MG tablet Take 20 mg by mouth daily.      Marland Kitchen levothyroxine (SYNTHROID, LEVOTHROID) 25 MCG tablet Take 25 mcg by mouth daily.      . metoprolol tartrate (LOPRESSOR) 25 MG tablet Take 12.5 mg by mouth 2 (  two) times daily.      . Multiple Vitamin (MULTIVITAMIN) tablet Take 1 tablet by mouth daily.        Marland Kitchen omeprazole (PRILOSEC) 40 MG capsule Take 40 mg by mouth daily.      Marland Kitchen oxyCODONE (OXY IR/ROXICODONE) 5 MG immediate release tablet Take 5 mg by mouth every 4 (four) hours as needed for pain.      . potassium chloride (K-DUR) 10 MEQ tablet Take 10 mEq by mouth daily.      . vitamin B-12 (CYANOCOBALAMIN) 1000 MCG tablet Take 1,000 mcg by mouth daily.       No current facility-administered medications for this visit.    Allergies as of 02/10/2013 - Review Complete 02/10/2013  Allergen Reaction Noted  . Bee venom  07/16/2012  . Codeine  Palpitations 10/24/2009    Family History  Problem Relation Age of Onset  . Coronary artery disease      FAMILY HISTORY  . Diabetes type II Mother   . Heart disease Mother   . Lymphoma Father   . Colon cancer Sister     late 61s    History   Social History  . Marital Status: Married    Spouse Name: SAMUEL    Number of Children: 2  . Years of Education: N/A   Occupational History  . RETIRED    Social History Main Topics  . Smoking status: Never Smoker   . Smokeless tobacco: Never Used  . Alcohol Use: No  . Drug Use: No  . Sexually Active: None   Other Topics Concern  . None   Social History Narrative  . None    Review of Systems: Negative unless mentioned in HPI.   Physical Exam: BP 99/62  Pulse 97  Temp(Src) 97.4 F (36.3 C) (Oral)  Ht 5\' 7"  (1.702 m)  Wt 242 lb (109.77 kg)  BMI 37.89 kg/m2 General:   Alert and oriented. No distress noted. Pleasant and cooperative.  Head:  Normocephalic and atraumatic. Eyes:  Conjuctiva clear without scleral icterus. Mouth:  Oral mucosa pink and moist. Good dentition. No lesions. Heart:  S1, S2 present without murmurs, rubs, or gallops. Regular rate and rhythm. Abdomen:  +BS, soft, non-tender and non-distended. No rebound or guarding. No HSM or masses noted. Extremities:  1-2+ edema LLE, 1+ edema RLE. LLE chronic swelling.  Neurologic:  Alert and  oriented x4;  grossly normal neurologically. Skin:  Intact without significant lesions or rashes. Cervical Nodes:  No significant cervical adenopathy. Psych:  Alert and cooperative. Normal mood and affect.

## 2013-02-11 ENCOUNTER — Telehealth: Payer: Self-pay

## 2013-02-11 MED ORDER — HYDROCORTISONE 2.5 % RE CREA: TOPICAL_CREAM | Freq: Two times a day (BID) | RECTAL | Status: DC

## 2013-02-11 NOTE — Telephone Encounter (Signed)
Beth from [facility] called and left voicemail- pts insurance wont pay for the hydrocortisone suppositories but they will pay for the hydrocortisone cream. They want to know if we can change it.

## 2013-02-11 NOTE — Telephone Encounter (Signed)
Done

## 2013-02-13 DIAGNOSIS — R399 Unspecified symptoms and signs involving the genitourinary system: Secondary | ICD-10-CM | POA: Insufficient documentation

## 2013-02-13 NOTE — Assessment & Plan Note (Addendum)
Recent hospitalization due to rectal bleeding without obvious source. TCS/EGD/capsule on file. No outright source of bleeding on capsule. Query hemorrhoids as culprit for significant rectal bleeding in the setting of Coumadin, which is still on hold. Significant improvement since discharge. It must be noted that her intestinal resection s/p Roux-en-Y gastric bypass did not allow complete visualization of small intestine via capsule endoscopy. Anemia likely multifactorial. Recommend supportive measures to include Anusol BID, recheck CBC now. 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 Check UA today due to urinary symptoms. Further recommendations to follow. 6-8 weeks return.   As separate issue, check labs to include Vit D, B1, B12, CBC, iron, ferritin due to malabsorptive state. I question possible thiamin deficiency (parasthesias in hands)

## 2013-02-15 NOTE — Progress Notes (Signed)
Cc PCP 

## 2013-02-22 NOTE — Progress Notes (Signed)
OPENED IN ERROR

## 2013-03-19 LAB — CBC WITH DIFFERENTIAL/PLATELET
Eosinophils Relative: 1 % (ref 0–5)
HCT: 28 % — ABNORMAL LOW (ref 36.0–46.0)
Hemoglobin: 8.9 g/dL — ABNORMAL LOW (ref 12.0–15.0)
Lymphocytes Relative: 11 % — ABNORMAL LOW (ref 12–46)
Lymphs Abs: 1.1 10*3/uL (ref 0.7–4.0)
MCV: 88.6 fL (ref 78.0–100.0)
Monocytes Relative: 7 % (ref 3–12)
Platelets: 229 10*3/uL (ref 150–400)
RBC: 3.16 MIL/uL — ABNORMAL LOW (ref 3.87–5.11)
WBC: 10.2 10*3/uL (ref 4.0–10.5)

## 2013-03-19 LAB — URINALYSIS W MICROSCOPIC + REFLEX CULTURE
Bilirubin Urine: NEGATIVE
Ketones, ur: NEGATIVE mg/dL
Protein, ur: NEGATIVE mg/dL
Squamous Epithelial / LPF: NONE SEEN
Urobilinogen, UA: 0.2 mg/dL (ref 0.0–1.0)

## 2013-03-19 LAB — VITAMIN B12: Vitamin B-12: 1227 pg/mL — ABNORMAL HIGH (ref 211–911)

## 2013-03-22 LAB — VITAMIN B1: Vitamin B1 (Thiamine): 9 nmol/L (ref 8–30)

## 2013-03-24 NOTE — Progress Notes (Signed)
Quick Note:  Can we check on culture report? May not be available. Looks like this was contaminated. Best thing would be to recheck UA with culture if still having symptoms. ______

## 2013-03-30 ENCOUNTER — Telehealth: Payer: Self-pay

## 2013-03-30 MED ORDER — ERGOCALCIFEROL 1.25 MG (50000 UT) PO CAPS: 50000.0000 [IU] | ORAL_CAPSULE | ORAL | Status: DC

## 2013-03-30 MED ORDER — VITAMIN B-1 100 MG PO TABS: 100.0000 mg | ORAL_TABLET | Freq: Every day | ORAL | Status: DC

## 2013-03-30 MED ORDER — FERROUS SULFATE 325 (65 FE) MG PO TABS: 325.0000 mg | ORAL_TABLET | Freq: Two times a day (BID) | ORAL | Status: DC

## 2013-03-30 NOTE — Progress Notes (Signed)
Quick Note:  Persistent anemia, low iron but elevated ferritin.  Thiamine is right at the cusp of deficiency; this is likely secondary to malabsorptive state.  Significantly Vit D deficiency noted.   She needs to take Iron 325 mg po BID.  Thiamine 100 mg po daily Vit D 50,000 iu gelcap once weekly for 8 weeks. Needs to take a complete multivitamin daily. Looks like her urinalysis was contaminated. If persistent symptoms, obtain repeat UA with culture.  I have sent in all of those medications to the pharmacy. ______

## 2013-03-30 NOTE — Telephone Encounter (Signed)
She needs to take Iron 325 mg po BID.  Thiamine 100 mg po daily Vit D 50,000 iu gelcap once weekly for 8 weeks. Needs to take a complete multivitamin daily.  Part of delay was due to waiting on urine culture to return, but it was never completed. I have sent the medications into the pharmacy. This is also documented under result notes.

## 2013-03-30 NOTE — Telephone Encounter (Signed)
Patient  Would like to know the results of her lab work.Please advise

## 2013-03-31 NOTE — Progress Notes (Signed)
Quick Note:  Have her return in about 8 weeks. ______

## 2013-04-01 ENCOUNTER — Ambulatory Visit: Payer: Medicare Other | Admitting: Gastroenterology

## 2013-04-01 NOTE — Progress Notes (Signed)
Pt has a appt. 04/28/13 with Tobi Bastos sams

## 2013-04-01 NOTE — Progress Notes (Signed)
There is a appt. In there for 04/28/13 with anna sams

## 2013-04-06 NOTE — Progress Notes (Signed)
Pt has appt. For 04/29/13 with [name] [name]

## 2013-04-28 ENCOUNTER — Ambulatory Visit (INDEPENDENT_AMBULATORY_CARE_PROVIDER_SITE_OTHER): Payer: PRIVATE HEALTH INSURANCE | Admitting: Gastroenterology

## 2013-04-28 ENCOUNTER — Encounter: Payer: Self-pay | Admitting: Gastroenterology

## 2013-04-28 VITALS — BP 99/61 | HR 93 | Temp 98.1°F | Ht 67.0 in

## 2013-04-28 DIAGNOSIS — D649 Anemia, unspecified: Secondary | ICD-10-CM

## 2013-04-28 DIAGNOSIS — K5900 Constipation, unspecified: Secondary | ICD-10-CM

## 2013-04-28 MED ORDER — LUBIPROSTONE 8 MCG PO CAPS: 8.0000 ug | ORAL_CAPSULE | Freq: Two times a day (BID) | ORAL | Status: DC

## 2013-04-28 NOTE — Progress Notes (Signed)
Referring Provider: Louie Boston., MD Primary Care Physician:  Louie Boston, MD Primary GI: Dr. Jena Gauss   Chief Complaint  Patient presents with  . Follow-up    HPI:   [name] [name] presents today in routine follow-up with a history of chronic abdominal pain, IBS, and history of gastric bypass. Labs early August with persistent anemia, borderline low normal thiamine, Vit D deficiency. Started on replacement Vit D X 8 weeks. One bad episode of rectal bleeding. If she doesn't take a laxative, becomes constipated. Takes stool softeners more than Miralax. EGD/TCS, capsule study on file.  Taken off iron by another provider due to possibility of causing rash. Off several weeks. Overall improved and feels better.   Past Medical History  Diagnosis Date  . Other chest pain   . Other chronic pain   . Esophageal reflux   . Unspecified essential hypertension   . Personal history of pulmonary embolism   . Myalgia and myositis, unspecified   . Coronary atherosclerosis of native coronary artery     CATHETERIZATION X 3  . Dysthymic disorder   . Morbid obesity   . Type II or unspecified type diabetes mellitus without mention of complication, not stated as uncontrolled   . Fibromyalgia   . GERD (gastroesophageal reflux disease)   . Anxiety   . Myocardial infarction   . TIA (transient ischemic attack)   . Acute kidney injury   . Complication of anesthesia   . PONV (postoperative nausea and vomiting)     Past Surgical History  Procedure Laterality Date  . Back surgery      X 2 FOR DISK PROBLEMS. three total back surgery  . Abdominal hysterectomy    . Gastric bypass      HISTORY OF  . Carpal tunnel release      bilateral  . Bladder suspension    . Cholecystectomy    . Colonoscopy      Three, h/o polyps. last one ?2 years ago Dr. Teena Dunk  . Esophagogastroduodenoscopy      remote  . Colonoscopy, esophagogastroduodenoscopy (egd) and esophageal dilation N/A 01/23/2013    [id number]  hemorrhoids otherwise normal colonoscopy/Mild changes of esophagitis at GE junction(focal erythema).Small gastric pouch with patent gastrojejunostomy.Normal mucosa of proximal small bowel  . Givens capsule study N/A 01/24/2013    Procedure: GIVENS CAPSULE STUDY;  Surgeon: Malissa Hippo, MD;  Location: [facility];  Service: Endoscopy;  Laterality: N/A;    Current Outpatient Prescriptions  Medication Sig Dispense Refill  . ALPRAZolam (XANAX) 0.5 MG tablet Take 0.5 mg by mouth 3 (three) times daily as needed. For anxiety      . citalopram (CELEXA) 20 MG tablet Take 20 mg by mouth daily.       . ergocalciferol (VITAMIN D2) 50000 UNITS capsule Take 1 capsule (50,000 Units total) by mouth once a week. For 8 weeks.  8 capsule  1  . fentaNYL (DURAGESIC - DOSED MCG/HR) 100 MCG/HR Place 1 patch onto the skin every 3 (three) days.      . furosemide (LASIX) 20 MG tablet Take 40 mg by mouth 2 (two) times daily.       . hydrocortisone (ANUSOL-HC) 2.5 % rectal cream Place rectally 2 (two) times daily.  30 g  1  . levothyroxine (SYNTHROID, LEVOTHROID) 25 MCG tablet Take 25 mcg by mouth daily.      . methocarbamol (ROBAXIN) 750 MG tablet Take 750 mg by mouth as needed.      Marland Kitchen  oxyCODONE (OXY IR/ROXICODONE) 5 MG immediate release tablet Take 10 mg by mouth every 4 (four) hours as needed for pain.       . ferrous sulfate 325 (65 FE) MG tablet Take 1 tablet (325 mg total) by mouth 2 (two) times daily.  60 tablet  3  . hydrocortisone (ANUSOL-HC) 25 MG suppository Place 1 suppository (25 mg total) rectally every 12 (twelve) hours.  12 suppository  1  . metoprolol tartrate (LOPRESSOR) 25 MG tablet Take 12.5 mg by mouth 2 (two) times daily.      . Multiple Vitamin (MULTIVITAMIN) tablet Take 1 tablet by mouth daily.        Marland Kitchen omeprazole (PRILOSEC) 40 MG capsule Take 40 mg by mouth daily.      . potassium chloride (K-DUR) 10 MEQ tablet Take 10 mEq by mouth daily.      Marland Kitchen thiamine (VITAMIN B-1) 100 MG tablet Take 1  tablet (100 mg total) by mouth daily.  30 tablet  5  . vitamin B-12 (CYANOCOBALAMIN) 1000 MCG tablet Take 1,000 mcg by mouth daily.       No current facility-administered medications for this visit.    Allergies as of 04/28/2013 - Review Complete 04/28/2013  Allergen Reaction Noted  . Bee venom  07/16/2012  . Codeine Palpitations 10/24/2009    Family History  Problem Relation Age of Onset  . Coronary artery disease      FAMILY HISTORY  . Diabetes type II Mother   . Heart disease Mother   . Lymphoma Father   . Colon cancer Sister     late 29s    History   Social History  . Marital Status: Married    Spouse Name: SAMUEL    Number of Children: 2  . Years of Education: N/A   Occupational History  . RETIRED    Social History Main Topics  . Smoking status: Never Smoker   . Smokeless tobacco: Never Used  . Alcohol Use: No  . Drug Use: No  . Sexual Activity: None   Other Topics Concern  . None   Social History Narrative  . None    Review of Systems: As mentioned in HPI.   Physical Exam: BP 99/61  Pulse 93  Temp(Src) 98.1 F (36.7 C) (Oral)  Ht 5\' 7"  (1.702 m) General:   Alert and oriented. No distress noted. Pleasant and cooperative.  Head:  Normocephalic and atraumatic. Eyes:  Conjuctiva clear without scleral icterus. Mouth:  Oral mucosa pink and moist. Good dentition. No lesions. Heart:  S1, S2 present without murmurs, rubs, or gallops. Regular rate and rhythm. Abdomen:  +BS, soft, non-tender and non-distended. No rebound or guarding. Obese. Limited exam as patient is in wheelchair.  Extremities:  Bilateral lower extremity edema, chronic.  Neurologic:  Alert and  oriented x4;  grossly normal neurologically. Skin:  Intact without significant lesions or rashes. Psych:  Alert and cooperative. Normal mood and affect.  Lab Results  Component Value Date   WBC 10.2 03/18/2013   HGB 8.9* 03/18/2013   HCT 28.0* 03/18/2013   MCV 88.6 03/18/2013   PLT 229 03/18/2013    Lab Results  Component Value Date   IRON 15* 03/18/2013   FERRITIN 331* 03/18/2013

## 2013-04-28 NOTE — Patient Instructions (Addendum)
Start taking Amitiza 1 capsule each evening with dinner. TAKE WITH FOOD to avoid nausea. Increase this to twice a day if you need better results. We have provided samples, and I sent the medication to your pharmacy.  We will recheck your blood work when you return in 4 weeks.

## 2013-04-29 DIAGNOSIS — K5900 Constipation, unspecified: Secondary | ICD-10-CM | POA: Insufficient documentation

## 2013-04-29 NOTE — Assessment & Plan Note (Signed)
Start Amitiza now. Return in 4 weeks.

## 2013-04-29 NOTE — Progress Notes (Signed)
CC'd to PCP 

## 2013-04-29 NOTE — Assessment & Plan Note (Signed)
TCS/EGD/capsule study on file. Rectal bleeding significantly improved. Query possible need for CR[facility] banding. Likely multifactorial in setting of chronic malabsorption s/p gastric bypass. Off iron; I have asked that she restart this. Recheck CBC, iron, ferritin in 4 weeks when she returns. May need evaluation by hematology if no improvement with oral iron due to malabsorptive state.

## 2013-05-21 DIAGNOSIS — C8589 Other specified types of non-Hodgkin lymphoma, extranodal and solid organ sites: Secondary | ICD-10-CM

## 2013-05-21 DIAGNOSIS — Z23 Encounter for immunization: Secondary | ICD-10-CM

## 2013-05-31 ENCOUNTER — Ambulatory Visit: Payer: PRIVATE HEALTH INSURANCE | Admitting: Gastroenterology

## 2013-06-01 DIAGNOSIS — R32 Unspecified urinary incontinence: Secondary | ICD-10-CM

## 2013-06-01 DIAGNOSIS — A498 Other bacterial infections of unspecified site: Secondary | ICD-10-CM

## 2013-06-01 DIAGNOSIS — C8589 Other specified types of non-Hodgkin lymphoma, extranodal and solid organ sites: Secondary | ICD-10-CM

## 2013-06-01 DIAGNOSIS — R609 Edema, unspecified: Secondary | ICD-10-CM

## 2013-06-01 DIAGNOSIS — N390 Urinary tract infection, site not specified: Secondary | ICD-10-CM

## 2013-06-01 DIAGNOSIS — C779 Secondary and unspecified malignant neoplasm of lymph node, unspecified: Secondary | ICD-10-CM

## 2013-06-07 DIAGNOSIS — Z5111 Encounter for antineoplastic chemotherapy: Secondary | ICD-10-CM

## 2013-06-07 DIAGNOSIS — C8589 Other specified types of non-Hodgkin lymphoma, extranodal and solid organ sites: Secondary | ICD-10-CM

## 2013-06-08 DIAGNOSIS — Z5112 Encounter for antineoplastic immunotherapy: Secondary | ICD-10-CM

## 2013-06-08 DIAGNOSIS — C8589 Other specified types of non-Hodgkin lymphoma, extranodal and solid organ sites: Secondary | ICD-10-CM

## 2013-06-10 DIAGNOSIS — D702 Other drug-induced agranulocytosis: Secondary | ICD-10-CM

## 2013-06-10 DIAGNOSIS — D649 Anemia, unspecified: Secondary | ICD-10-CM

## 2013-06-15 DIAGNOSIS — D649 Anemia, unspecified: Secondary | ICD-10-CM

## 2013-06-23 DIAGNOSIS — C8589 Other specified types of non-Hodgkin lymphoma, extranodal and solid organ sites: Secondary | ICD-10-CM

## 2013-06-23 DIAGNOSIS — T451X5A Adverse effect of antineoplastic and immunosuppressive drugs, initial encounter: Secondary | ICD-10-CM

## 2013-06-23 DIAGNOSIS — K922 Gastrointestinal hemorrhage, unspecified: Secondary | ICD-10-CM

## 2013-06-23 DIAGNOSIS — D649 Anemia, unspecified: Secondary | ICD-10-CM

## 2013-06-23 DIAGNOSIS — D6481 Anemia due to antineoplastic chemotherapy: Secondary | ICD-10-CM

## 2013-07-13 DIAGNOSIS — K121 Other forms of stomatitis: Secondary | ICD-10-CM

## 2013-07-13 DIAGNOSIS — K1230 Oral mucositis (ulcerative), unspecified: Secondary | ICD-10-CM

## 2013-07-13 DIAGNOSIS — I82409 Acute embolism and thrombosis of unspecified deep veins of unspecified lower extremity: Secondary | ICD-10-CM

## 2013-07-13 DIAGNOSIS — C8589 Other specified types of non-Hodgkin lymphoma, extranodal and solid organ sites: Secondary | ICD-10-CM

## 2013-07-13 DIAGNOSIS — R350 Frequency of micturition: Secondary | ICD-10-CM

## 2013-07-13 DIAGNOSIS — D649 Anemia, unspecified: Secondary | ICD-10-CM

## 2013-07-14 DIAGNOSIS — C8589 Other specified types of non-Hodgkin lymphoma, extranodal and solid organ sites: Secondary | ICD-10-CM

## 2013-07-14 DIAGNOSIS — Z5112 Encounter for antineoplastic immunotherapy: Secondary | ICD-10-CM

## 2013-07-14 DIAGNOSIS — Z5111 Encounter for antineoplastic chemotherapy: Secondary | ICD-10-CM

## 2013-07-16 ENCOUNTER — Encounter: Payer: Self-pay | Admitting: Cardiology

## 2013-07-16 DIAGNOSIS — C8589 Other specified types of non-Hodgkin lymphoma, extranodal and solid organ sites: Secondary | ICD-10-CM

## 2013-07-26 ENCOUNTER — Other Ambulatory Visit: Payer: Self-pay

## 2013-07-27 NOTE — Telephone Encounter (Signed)
Should have completed vitamin D regimen by now. She was supposed to have labs and return OV per Tobi Bastos, see last 04/2013 OV note.  Please arrange for labs and OV with Tobi Bastos.

## 2013-07-28 ENCOUNTER — Other Ambulatory Visit: Payer: Self-pay

## 2013-07-28 ENCOUNTER — Other Ambulatory Visit: Payer: Self-pay | Admitting: Gastroenterology

## 2013-07-28 DIAGNOSIS — D649 Anemia, unspecified: Secondary | ICD-10-CM

## 2013-07-28 NOTE — Telephone Encounter (Signed)
Letter and lab order mailed to pt.  [name], please schedule ov. 

## 2013-07-28 NOTE — Telephone Encounter (Signed)
Pt is aware of OV on 1/15 at 0930 with [facility] and appt card was mailed

## 2013-07-29 ENCOUNTER — Other Ambulatory Visit: Payer: Self-pay | Admitting: Gastroenterology

## 2013-08-13 ENCOUNTER — Ambulatory Visit: Payer: PRIVATE HEALTH INSURANCE | Admitting: Cardiology

## 2013-08-18 ENCOUNTER — Encounter: Payer: Self-pay | Admitting: Cardiology

## 2013-08-26 ENCOUNTER — Ambulatory Visit: Payer: PRIVATE HEALTH INSURANCE | Admitting: Gastroenterology

## 2013-08-26 ENCOUNTER — Ambulatory Visit: Payer: PRIVATE HEALTH INSURANCE | Admitting: Cardiology

## 2013-08-26 NOTE — Progress Notes (Unsigned)
Clinical Summary [name] [name] is a 73 y.o.female former patient of Dr [name] [name], this is our first visit together. She is seen for the following medical problems.  1. Atypical chest pain - long historyof chest pain, prior false positive Cardiolite with  Non-obstructive CAD by cath in 2008. There is brief mention of other caths but I do not see these reports.  - pain thought previously to be GI related or potentially fibromyalgia.   2. TIA - on coumadin  3. Cellulitis - recent admission at [facility] 08/2013 with pain and swelling in legs.  - treated with diuretics and antibiotics. Diureses nearly 8 liters, her home lasix was changed to torsemide at discharge.  4. 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 Other chest pain   . Other chronic pain   . Esophageal reflux   . Unspecified essential hypertension   . Personal history of pulmonary embolism   . Myalgia and myositis, unspecified   . Coronary atherosclerosis of native coronary artery     CATHETERIZATION X 3  . Dysthymic disorder   . Morbid obesity   . Type II or unspecified type diabetes mellitus without mention of complication, not stated as uncontrolled   . Fibromyalgia   . GERD (gastroesophageal reflux disease)   . Anxiety   . Myocardial infarction   . TIA (transient ischemic attack)   . Acute kidney injury   . Complication of anesthesia   . PONV (postoperative nausea and vomiting)      Allergies  Allergen Reactions  . Bee Venom   . Codeine Palpitations    Per patient, "Feels like I'm having a heart attack."     Current Outpatient Prescriptions  Medication Sig Dispense Refill  . ALPRAZolam (XANAX) 0.5 MG tablet Take 0.5 mg by mouth 3 (three) times daily as needed. For anxiety      . citalopram (CELEXA) 20 MG tablet Take 20 mg by mouth daily.       . ergocalciferol (VITAMIN D2) 50000 UNITS capsule Take 1 capsule (50,000 Units total) by mouth once a week. For  8 weeks.  8 capsule  1  . fentaNYL (DURAGESIC - DOSED MCG/HR) 100 MCG/HR Place 1 patch onto the skin every 3 (three) days.      . ferrous sulfate 325 (65 FE) MG tablet Take 1 tablet (325 mg total) by mouth 2 (two) times daily.  60 tablet  3  . furosemide (LASIX) 20 MG tablet Take 40 mg by mouth 2 (two) times daily.       . hydrocortisone (ANUSOL-HC) 2.5 % rectal cream Place rectally 2 (two) times daily.  30 g  1  . hydrocortisone (ANUSOL-HC) 25 MG suppository Place 1 suppository (25 mg total) rectally every 12 (twelve) hours.  12 suppository  1  . levothyroxine (SYNTHROID, LEVOTHROID) 25 MCG tablet Take 25 mcg by mouth daily.      Marland Kitchen lubiprostone (AMITIZA) 8 MCG capsule Take 1 capsule (8 mcg total) by mouth 2 (two) times daily with a meal.  60 capsule  3  . methocarbamol (ROBAXIN) 750 MG tablet Take 750 mg by mouth as needed.      . metoprolol tartrate (LOPRESSOR) 25 MG tablet Take 12.5 mg by mouth 2 (two) times daily.      . Multiple Vitamin (MULTIVITAMIN) tablet Take 1 tablet by mouth daily.        Marland Kitchen omeprazole (PRILOSEC) 40 MG capsule Take 40 mg by  mouth daily.      Marland Kitchen oxyCODONE (OXY IR/ROXICODONE) 5 MG immediate release tablet Take 10 mg by mouth every 4 (four) hours as needed for pain.       . potassium chloride (K-DUR) 10 MEQ tablet Take 10 mEq by mouth daily.      Marland Kitchen thiamine (VITAMIN B-1) 100 MG tablet Take 1 tablet (100 mg total) by mouth daily.  30 tablet  5  . vitamin B-12 (CYANOCOBALAMIN) 1000 MCG tablet Take 1,000 mcg by mouth daily.       No current facility-administered medications for this visit.     Past Surgical History  Procedure Laterality Date  . Back surgery      X 2 FOR DISK PROBLEMS. three total back surgery  . Abdominal hysterectomy    . Gastric bypass      HISTORY OF  . Carpal tunnel release      bilateral  . Bladder suspension    . Cholecystectomy    . Colonoscopy      Three, h/o polyps. last one ?2 years ago Dr. Britta [name]  . Esophagogastroduodenoscopy       remote  . Colonoscopy, esophagogastroduodenoscopy (egd) and esophageal dilation N/A 01/23/2013    [id number] hemorrhoids otherwise normal colonoscopy/Mild changes of esophagitis at GE junction(focal erythema).Small gastric pouch with patent gastrojejunostomy.Normal mucosa of proximal small bowel  . Givens capsule study N/A 01/24/2013    Procedure: GIVENS CAPSULE STUDY;  Surgeon: [name] Houston, MD;  Location: [facility];  Service: Endoscopy;  Laterality: N/A;     Allergies  Allergen Reactions  . Bee Venom   . Codeine Palpitations    Per patient, "Feels like I'm having a heart attack."      Family History  Problem Relation Age of Onset  . Coronary artery disease      FAMILY HISTORY  . Diabetes type II Mother   . Heart disease Mother   . Lymphoma Father   . Colon cancer Sister     late 45s     Social History [name] [name] reports that she has never smoked. She has never used smokeless tobacco. [name] [name] reports that she does not drink alcohol.   Review of Systems CONSTITUTIONAL: No weight loss, fever, chills, weakness or fatigue.  HEENT: Eyes: No visual loss, blurred vision, double vision or yellow sclerae.No hearing loss, sneezing, congestion, runny nose or sore throat.  SKIN: No rash or itching.  CARDIOVASCULAR:  RESPIRATORY: No shortness of breath, cough or sputum.  GASTROINTESTINAL: No anorexia, nausea, vomiting or diarrhea. No abdominal pain or blood.  GENITOURINARY: No burning on urination, no polyuria NEUROLOGICAL: No headache, dizziness, syncope, paralysis, ataxia, numbness or tingling in the extremities. No change in bowel or bladder control.  MUSCULOSKELETAL: No muscle, back pain, joint pain or stiffness.  LYMPHATICS: No enlarged nodes. No history of splenectomy.  PSYCHIATRIC: No history of depression or anxiety.  ENDOCRINOLOGIC: No reports of sweating, cold or heat intolerance. No polyuria or polydipsia.  Marland Kitchen   Physical Examination There were no vitals  filed for this visit. There were no vitals filed for this visit.  Gen: resting comfortably, no acute distress HEENT: no scleral icterus, pupils equal round and reactive, no palptable cervical adenopathy,  CV Resp: Clear to auscultation bilaterally GI: abdomen is soft, non-tender, non-distended, normal bowel sounds, no hepatosplenomegaly MSK: extremities are warm, no edema.  Skin: warm, no rash Neuro:  no focal deficits Psych: appropriate affect   Diagnostic Studies 10/2006 cath RESULTS:  HEMODYNAMICS: LV  186/22, AO 165/115.  CORONARIES: The left main was normal. The LAD had luminal  irregularities. First diagonal was moderate and normal. The circumflex  in the AV groove was normal. First obtuse marginal was moderate and  normal. Second obtuse marginal was large and normal. Posterolateral  was large and normal. The right coronary artery was dominant. There  was proximal 25% stenosis. The PDA was of moderate size and normal.  The posterolateral was small x2 and normal.  LEFT VENTRICULOGRAM: The left ventriculogram was obtained in the RAO  projection. The EF was 65%.  CONCLUSION: Minimal coronary plaque. Normal left ventricular function.  There did appear to be left ventricular outflow gradient. However,  there was significant ventricular ectopy. This needs to be put into  clinical context, as there does not appear to be significant valvular  abnormality or other outflow obstruction per Dr. Arlina Robes note.      Assessment and Plan        Arnoldo Lenis, M.D., F.A.C.C.

## 2013-08-27 ENCOUNTER — Ambulatory Visit: Payer: PRIVATE HEALTH INSURANCE | Admitting: Cardiology

## 2013-08-29 ENCOUNTER — Encounter (HOSPITAL_COMMUNITY): Payer: PRIVATE HEALTH INSURANCE | Admitting: Anesthesiology

## 2013-08-29 ENCOUNTER — Encounter (HOSPITAL_COMMUNITY)
Admission: EM | Disposition: A | Discharge status: Skilled Nursing Facility | Payer: Self-pay | Source: Other Acute Inpatient Hospital | Attending: Internal Medicine

## 2013-08-29 ENCOUNTER — Inpatient Hospital Stay (HOSPITAL_COMMUNITY): Payer: PRIVATE HEALTH INSURANCE

## 2013-08-29 ENCOUNTER — Inpatient Hospital Stay (HOSPITAL_COMMUNITY)
Admission: EM | Admit: 2013-08-29 | Discharge: 2013-09-01 | DRG: 470 | Disposition: A | Discharge status: Skilled Nursing Facility | Payer: PRIVATE HEALTH INSURANCE | Source: Other Acute Inpatient Hospital | Attending: Internal Medicine | Admitting: Internal Medicine

## 2013-08-29 ENCOUNTER — Encounter (HOSPITAL_COMMUNITY): Payer: Self-pay | Admitting: *Deleted

## 2013-08-29 ENCOUNTER — Inpatient Hospital Stay (HOSPITAL_COMMUNITY): Payer: PRIVATE HEALTH INSURANCE | Admitting: Anesthesiology

## 2013-08-29 DIAGNOSIS — Z79899 Other long term (current) drug therapy: Secondary | ICD-10-CM

## 2013-08-29 DIAGNOSIS — Z86718 Personal history of other venous thrombosis and embolism: Secondary | ICD-10-CM

## 2013-08-29 DIAGNOSIS — M62838 Other muscle spasm: Secondary | ICD-10-CM | POA: Diagnosis not present

## 2013-08-29 DIAGNOSIS — Z9884 Bariatric surgery status: Secondary | ICD-10-CM

## 2013-08-29 DIAGNOSIS — Z6837 Body mass index (BMI) 37.0-37.9, adult: Secondary | ICD-10-CM

## 2013-08-29 DIAGNOSIS — I2510 Atherosclerotic heart disease of native coronary artery without angina pectoris: Secondary | ICD-10-CM | POA: Diagnosis present

## 2013-08-29 DIAGNOSIS — Z885 Allergy status to narcotic agent status: Secondary | ICD-10-CM

## 2013-08-29 DIAGNOSIS — Z833 Family history of diabetes mellitus: Secondary | ICD-10-CM

## 2013-08-29 DIAGNOSIS — Y838 Other surgical procedures as the cause of abnormal reaction of the patient, or of later complication, without mention of misadventure at the time of the procedure: Secondary | ICD-10-CM | POA: Diagnosis present

## 2013-08-29 DIAGNOSIS — L02419 Cutaneous abscess of limb, unspecified: Secondary | ICD-10-CM | POA: Diagnosis present

## 2013-08-29 DIAGNOSIS — G8220 Paraplegia, unspecified: Secondary | ICD-10-CM

## 2013-08-29 DIAGNOSIS — S72009A Fracture of unspecified part of neck of unspecified femur, initial encounter for closed fracture: Principal | ICD-10-CM | POA: Diagnosis present

## 2013-08-29 DIAGNOSIS — C8190 Hodgkin lymphoma, unspecified, unspecified site: Secondary | ICD-10-CM | POA: Diagnosis present

## 2013-08-29 DIAGNOSIS — I252 Old myocardial infarction: Secondary | ICD-10-CM

## 2013-08-29 DIAGNOSIS — Z807 Family history of other malignant neoplasms of lymphoid, hematopoietic and related tissues: Secondary | ICD-10-CM

## 2013-08-29 DIAGNOSIS — L03119 Cellulitis of unspecified part of limb: Secondary | ICD-10-CM

## 2013-08-29 DIAGNOSIS — R296 Repeated falls: Secondary | ICD-10-CM | POA: Diagnosis present

## 2013-08-29 DIAGNOSIS — IMO0002 Reserved for concepts with insufficient information to code with codable children: Secondary | ICD-10-CM

## 2013-08-29 DIAGNOSIS — Z86711 Personal history of pulmonary embolism: Secondary | ICD-10-CM

## 2013-08-29 DIAGNOSIS — M549 Dorsalgia, unspecified: Secondary | ICD-10-CM

## 2013-08-29 DIAGNOSIS — Z9089 Acquired absence of other organs: Secondary | ICD-10-CM

## 2013-08-29 DIAGNOSIS — K219 Gastro-esophageal reflux disease without esophagitis: Secondary | ICD-10-CM | POA: Diagnosis present

## 2013-08-29 DIAGNOSIS — D649 Anemia, unspecified: Secondary | ICD-10-CM | POA: Diagnosis present

## 2013-08-29 DIAGNOSIS — I10 Essential (primary) hypertension: Secondary | ICD-10-CM | POA: Diagnosis present

## 2013-08-29 DIAGNOSIS — IMO0001 Reserved for inherently not codable concepts without codable children: Secondary | ICD-10-CM | POA: Diagnosis present

## 2013-08-29 DIAGNOSIS — Z8249 Family history of ischemic heart disease and other diseases of the circulatory system: Secondary | ICD-10-CM

## 2013-08-29 DIAGNOSIS — L0291 Cutaneous abscess, unspecified: Secondary | ICD-10-CM

## 2013-08-29 DIAGNOSIS — Y92009 Unspecified place in unspecified non-institutional (private) residence as the place of occurrence of the external cause: Secondary | ICD-10-CM

## 2013-08-29 DIAGNOSIS — Z993 Dependence on wheelchair: Secondary | ICD-10-CM

## 2013-08-29 DIAGNOSIS — G8929 Other chronic pain: Secondary | ICD-10-CM | POA: Diagnosis present

## 2013-08-29 DIAGNOSIS — Z7901 Long term (current) use of anticoagulants: Secondary | ICD-10-CM

## 2013-08-29 DIAGNOSIS — E119 Type 2 diabetes mellitus without complications: Secondary | ICD-10-CM | POA: Diagnosis present

## 2013-08-29 DIAGNOSIS — L0390 Cellulitis, unspecified: Secondary | ICD-10-CM | POA: Diagnosis present

## 2013-08-29 DIAGNOSIS — Z91038 Other insect allergy status: Secondary | ICD-10-CM

## 2013-08-29 DIAGNOSIS — F341 Dysthymic disorder: Secondary | ICD-10-CM | POA: Diagnosis present

## 2013-08-29 DIAGNOSIS — M797 Fibromyalgia: Secondary | ICD-10-CM | POA: Diagnosis present

## 2013-08-29 DIAGNOSIS — J9600 Acute respiratory failure, unspecified whether with hypoxia or hypercapnia: Secondary | ICD-10-CM

## 2013-08-29 DIAGNOSIS — D62 Acute posthemorrhagic anemia: Secondary | ICD-10-CM

## 2013-08-29 DIAGNOSIS — Z8673 Personal history of transient ischemic attack (TIA), and cerebral infarction without residual deficits: Secondary | ICD-10-CM

## 2013-08-29 HISTORY — PX: HIP ARTHROPLASTY: SHX981

## 2013-08-29 LAB — COMPREHENSIVE METABOLIC PANEL
ALT: 17 U/L (ref 0–35)
AST: 26 U/L (ref 0–37)
Albumin: 2.4 g/dL — ABNORMAL LOW (ref 3.5–5.2)
Alkaline Phosphatase: 137 U/L — ABNORMAL HIGH (ref 39–117)
BUN: 20 mg/dL (ref 6–23)
CALCIUM: 7.8 mg/dL — AB (ref 8.4–10.5)
CO2: 26 meq/L (ref 19–32)
CREATININE: 1.01 mg/dL (ref 0.50–1.10)
Chloride: 101 mEq/L (ref 96–112)
GFR, EST AFRICAN AMERICAN: 63 mL/min — AB (ref >90)
GFR, EST NON AFRICAN AMERICAN: 55 mL/min — AB (ref >90)
Glucose, Bld: 116 mg/dL — ABNORMAL HIGH (ref 70–99)
Potassium: 4.6 mEq/L (ref 3.7–5.3)
SODIUM: 137 meq/L (ref 137–147)
TOTAL PROTEIN: 5.2 g/dL — AB (ref 6.0–8.3)
Total Bilirubin: 0.5 mg/dL (ref 0.3–1.2)

## 2013-08-29 LAB — CBC
HEMATOCRIT: 29.6 % — AB (ref 36.0–46.0)
Hemoglobin: 9.3 g/dL — ABNORMAL LOW (ref 12.0–15.0)
MCH: 29.6 pg (ref 26.0–34.0)
MCHC: 31.4 g/dL (ref 30.0–36.0)
MCV: 94.3 fL (ref 78.0–100.0)
PLATELETS: 127 10*3/uL — AB (ref 150–400)
RBC: 3.14 MIL/uL — ABNORMAL LOW (ref 3.87–5.11)
RDW: 16.2 % — AB (ref 11.5–15.5)
WBC: 8.3 10*3/uL (ref 4.0–10.5)

## 2013-08-29 LAB — PROTIME-INR
INR: 1.4 (ref 0.00–1.49)
Prothrombin Time: 16.8 seconds — ABNORMAL HIGH (ref 11.6–15.2)

## 2013-08-29 LAB — SURGICAL PCR SCREEN
MRSA, PCR: NEGATIVE
Staphylococcus aureus: NEGATIVE

## 2013-08-29 LAB — TYPE AND SCREEN
ABO/RH(D): A POS
ANTIBODY SCREEN: NEGATIVE

## 2013-08-29 LAB — APTT: aPTT: 36 seconds (ref 24–37)

## 2013-08-29 LAB — ABO/RH: ABO/RH(D): A POS

## 2013-08-29 SURGERY — HEMIARTHROPLASTY, HIP, DIRECT ANTERIOR APPROACH, FOR FRACTURE
Anesthesia: General | Site: Hip | Laterality: Right

## 2013-08-29 MED ORDER — ONDANSETRON HCL 4 MG/2ML IJ SOLN: 4.0000 mg | Freq: Four times a day (QID) | INTRAMUSCULAR | Status: DC | PRN

## 2013-08-29 MED ORDER — TORSEMIDE 5 MG PO TABS
5.0000 mg | ORAL_TABLET | Freq: Every morning | ORAL | Status: DC
  Administered 2013-08-29 – 2013-08-31 (×3): 5 mg via ORAL
  Filled 2013-08-29 (×3): qty 1

## 2013-08-29 MED ORDER — VITAMIN B-1 100 MG PO TABS
100.0000 mg | ORAL_TABLET | Freq: Every day | ORAL | Status: DC
  Administered 2013-08-30 – 2013-09-01 (×3): 100 mg via ORAL
  Filled 2013-08-29 (×4): qty 1

## 2013-08-29 MED ORDER — HYDROCORTISONE ACETATE 25 MG RE SUPP
25.0000 mg | Freq: Two times a day (BID) | RECTAL | Status: DC
  Administered 2013-08-29 – 2013-08-31 (×4): 25 mg via RECTAL
  Filled 2013-08-29 (×9): qty 1

## 2013-08-29 MED ORDER — LEVOTHYROXINE SODIUM 25 MCG PO TABS
25.0000 ug | ORAL_TABLET | Freq: Every day | ORAL | Status: DC
  Filled 2013-08-29 (×2): qty 1

## 2013-08-29 MED ORDER — ACETAMINOPHEN 325 MG PO TABS
650.0000 mg | ORAL_TABLET | Freq: Four times a day (QID) | ORAL | Status: DC | PRN
  Administered 2013-08-30 (×2): 650 mg via ORAL
  Filled 2013-08-29 (×2): qty 2

## 2013-08-29 MED ORDER — CEPHALEXIN 500 MG PO CAPS: 500.0000 mg | ORAL_CAPSULE | Freq: Three times a day (TID) | ORAL | Status: DC

## 2013-08-29 MED ORDER — PREDNISONE 20 MG PO TABS: 20.0000 mg | ORAL_TABLET | Freq: Every day | ORAL | Status: DC

## 2013-08-29 MED ORDER — POTASSIUM CHLORIDE CRYS ER 20 MEQ PO TBCR
20.0000 meq | EXTENDED_RELEASE_TABLET | Freq: Two times a day (BID) | ORAL | Status: DC
  Administered 2013-08-29 – 2013-09-01 (×7): 20 meq via ORAL
  Filled 2013-08-29 (×8): qty 1

## 2013-08-29 MED ORDER — PROPOFOL 10 MG/ML IV BOLUS
INTRAVENOUS | Status: AC
  Filled 2013-08-29: qty 20

## 2013-08-29 MED ORDER — HYDROCORTISONE SOD SUCCINATE 100 MG IJ SOLR
100.0000 mg | Freq: Every evening | INTRAMUSCULAR | Status: AC
  Administered 2013-08-29: 100 g via INTRAVENOUS

## 2013-08-29 MED ORDER — LIDOCAINE HCL (CARDIAC) 20 MG/ML IV SOLN
INTRAVENOUS | Status: DC | PRN
  Administered 2013-08-29: 30 mg via INTRAVENOUS

## 2013-08-29 MED ORDER — SUCCINYLCHOLINE CHLORIDE 20 MG/ML IJ SOLN
INTRAMUSCULAR | Status: DC | PRN
  Administered 2013-08-29: 100 mg via INTRAVENOUS

## 2013-08-29 MED ORDER — LEVOTHYROXINE SODIUM 50 MCG PO TABS
50.0000 ug | ORAL_TABLET | Freq: Every day | ORAL | Status: DC
  Administered 2013-08-30 – 2013-09-01 (×3): 50 ug via ORAL
  Filled 2013-08-29 (×5): qty 1

## 2013-08-29 MED ORDER — KETAMINE HCL 10 MG/ML IJ SOLN
INTRAMUSCULAR | Status: AC
  Filled 2013-08-29: qty 1

## 2013-08-29 MED ORDER — SODIUM CHLORIDE 0.9 % IV SOLN
INTRAVENOUS | Status: DC
  Administered 2013-08-29 (×2): via INTRAVENOUS
  Filled 2013-08-29 (×5): qty 1000

## 2013-08-29 MED ORDER — HYDROCORTISONE SOD SUCCINATE 100 MG IJ SOLR
INTRAMUSCULAR | Status: AC
  Filled 2013-08-29: qty 2

## 2013-08-29 MED ORDER — HYDROMORPHONE HCL PF 1 MG/ML IJ SOLN
INTRAMUSCULAR | Status: AC
  Filled 2013-08-29: qty 1

## 2013-08-29 MED ORDER — FENTANYL CITRATE 0.05 MG/ML IJ SOLN
INTRAMUSCULAR | Status: DC | PRN
  Administered 2013-08-29: 100 ug via INTRAVENOUS

## 2013-08-29 MED ORDER — ONDANSETRON HCL 4 MG/2ML IJ SOLN
INTRAMUSCULAR | Status: AC
  Filled 2013-08-29: qty 2

## 2013-08-29 MED ORDER — METOCLOPRAMIDE HCL 5 MG/ML IJ SOLN: 5.0000 mg | Freq: Three times a day (TID) | INTRAMUSCULAR | Status: DC | PRN

## 2013-08-29 MED ORDER — FENTANYL 100 MCG/HR TD PT72
100.0000 ug | MEDICATED_PATCH | TRANSDERMAL | Status: DC
  Administered 2013-08-29 – 2013-09-01 (×2): 100 ug via TRANSDERMAL
  Filled 2013-08-29 (×2): qty 1

## 2013-08-29 MED ORDER — LACTATED RINGERS IV SOLN: INTRAVENOUS | Status: DC

## 2013-08-29 MED ORDER — METHOCARBAMOL 500 MG PO TABS: 750.0000 mg | ORAL_TABLET | Freq: Three times a day (TID) | ORAL | Status: DC | PRN

## 2013-08-29 MED ORDER — CITALOPRAM HYDROBROMIDE 20 MG PO TABS
20.0000 mg | ORAL_TABLET | Freq: Every morning | ORAL | Status: DC
  Administered 2013-08-30 – 2013-09-01 (×3): 20 mg via ORAL
  Filled 2013-08-29 (×4): qty 1

## 2013-08-29 MED ORDER — DEXTROSE 5 % IV SOLN
500.0000 mg | Freq: Four times a day (QID) | INTRAVENOUS | Status: DC | PRN
  Filled 2013-08-29: qty 5

## 2013-08-29 MED ORDER — GABAPENTIN 300 MG PO CAPS
300.0000 mg | ORAL_CAPSULE | Freq: Three times a day (TID) | ORAL | Status: DC
  Administered 2013-08-29 – 2013-08-30 (×3): 300 mg via ORAL
  Filled 2013-08-29 (×6): qty 1

## 2013-08-29 MED ORDER — EPHEDRINE SULFATE 50 MG/ML IJ SOLN
INTRAMUSCULAR | Status: DC | PRN
  Administered 2013-08-29: 10 mg via INTRAVENOUS

## 2013-08-29 MED ORDER — POLYETHYLENE GLYCOL 3350 17 G PO PACK
17.0000 g | PACK | Freq: Two times a day (BID) | ORAL | Status: DC
  Administered 2013-08-29 – 2013-09-01 (×5): 17 g via ORAL

## 2013-08-29 MED ORDER — HYDROMORPHONE HCL PF 2 MG/ML IJ SOLN
INTRAMUSCULAR | Status: AC
  Filled 2013-08-29: qty 1

## 2013-08-29 MED ORDER — METOPROLOL TARTRATE 12.5 MG HALF TABLET
12.5000 mg | ORAL_TABLET | Freq: Two times a day (BID) | ORAL | Status: DC
  Administered 2013-08-29 – 2013-09-01 (×7): 12.5 mg via ORAL
  Filled 2013-08-29 (×8): qty 1

## 2013-08-29 MED ORDER — ACETAMINOPHEN 650 MG RE SUPP: 650.0000 mg | Freq: Four times a day (QID) | RECTAL | Status: DC | PRN

## 2013-08-29 MED ORDER — ENOXAPARIN SODIUM 30 MG/0.3ML ~~LOC~~ SOLN
30.0000 mg | SUBCUTANEOUS | Status: DC
  Filled 2013-08-29: qty 0.3

## 2013-08-29 MED ORDER — ENOXAPARIN SODIUM 40 MG/0.4ML ~~LOC~~ SOLN
40.0000 mg | SUBCUTANEOUS | Status: DC
  Administered 2013-08-30 – 2013-09-01 (×3): 40 mg via SUBCUTANEOUS
  Filled 2013-08-29 (×4): qty 0.4

## 2013-08-29 MED ORDER — OXYCODONE HCL 5 MG PO TABS
10.0000 mg | ORAL_TABLET | ORAL | Status: DC | PRN
  Administered 2013-08-29 – 2013-09-01 (×13): 10 mg via ORAL
  Filled 2013-08-29: qty 1
  Filled 2013-08-29 (×9): qty 2
  Filled 2013-08-29: qty 1
  Filled 2013-08-29 (×4): qty 2

## 2013-08-29 MED ORDER — METOCLOPRAMIDE HCL 10 MG PO TABS: 5.0000 mg | ORAL_TABLET | Freq: Three times a day (TID) | ORAL | Status: DC | PRN

## 2013-08-29 MED ORDER — FENTANYL CITRATE 0.05 MG/ML IJ SOLN
INTRAMUSCULAR | Status: AC
  Filled 2013-08-29: qty 2

## 2013-08-29 MED ORDER — MENTHOL 3 MG MT LOZG: 1.0000 | LOZENGE | OROMUCOSAL | Status: DC | PRN

## 2013-08-29 MED ORDER — CEFAZOLIN SODIUM-DEXTROSE 2-3 GM-% IV SOLR
2.0000 g | Freq: Once | INTRAVENOUS | Status: AC
  Administered 2013-08-29: 2 g via INTRAVENOUS

## 2013-08-29 MED ORDER — ONDANSETRON HCL 4 MG/2ML IJ SOLN
INTRAMUSCULAR | Status: DC | PRN
  Administered 2013-08-29 (×2): 2 mg via INTRAVENOUS

## 2013-08-29 MED ORDER — HYDROMORPHONE HCL PF 1 MG/ML IJ SOLN
INTRAMUSCULAR | Status: DC | PRN
  Administered 2013-08-29 (×4): 0.5 mg via INTRAVENOUS

## 2013-08-29 MED ORDER — LACTATED RINGERS IV SOLN: INTRAVENOUS | Status: DC | PRN

## 2013-08-29 MED ORDER — DOCUSATE SODIUM 100 MG PO CAPS
100.0000 mg | ORAL_CAPSULE | Freq: Two times a day (BID) | ORAL | Status: DC
  Administered 2013-08-29 – 2013-09-01 (×7): 100 mg via ORAL

## 2013-08-29 MED ORDER — HYDROMORPHONE HCL PF 1 MG/ML IJ SOLN
0.5000 mg | INTRAMUSCULAR | Status: DC | PRN
  Administered 2013-08-29 – 2013-08-31 (×2): 1 mg via INTRAVENOUS
  Filled 2013-08-29 (×2): qty 1

## 2013-08-29 MED ORDER — SODIUM CHLORIDE 0.9 % IV SOLN
INTRAVENOUS | Status: DC
  Administered 2013-08-29 – 2013-08-31 (×3): via INTRAVENOUS

## 2013-08-29 MED ORDER — PANTOPRAZOLE SODIUM 40 MG PO TBEC
40.0000 mg | DELAYED_RELEASE_TABLET | Freq: Every day | ORAL | Status: DC
  Administered 2013-08-30 – 2013-09-01 (×3): 40 mg via ORAL
  Filled 2013-08-29 (×3): qty 1

## 2013-08-29 MED ORDER — HYDROMORPHONE HCL PF 1 MG/ML IJ SOLN
0.2500 mg | INTRAMUSCULAR | Status: DC | PRN
  Administered 2013-08-29 (×2): 0.5 mg via INTRAVENOUS

## 2013-08-29 MED ORDER — HYDROCORTISONE SOD SUCCINATE 100 MG PF FOR IT USE: 50.0000 mg | Freq: Once | INTRAMUSCULAR | Status: DC

## 2013-08-29 MED ORDER — HYDROCODONE-ACETAMINOPHEN 5-325 MG PO TABS: 1.0000 | ORAL_TABLET | Freq: Four times a day (QID) | ORAL | Status: DC | PRN

## 2013-08-29 MED ORDER — WARFARIN - PHARMACIST DOSING INPATIENT: Freq: Every day | Status: DC

## 2013-08-29 MED ORDER — ONDANSETRON HCL 4 MG PO TABS: 4.0000 mg | ORAL_TABLET | Freq: Four times a day (QID) | ORAL | Status: DC | PRN

## 2013-08-29 MED ORDER — HYDROMORPHONE HCL PF 1 MG/ML IJ SOLN
0.5000 mg | INTRAMUSCULAR | Status: DC | PRN
  Administered 2013-08-29: 0.5 mg via INTRAVENOUS
  Filled 2013-08-29: qty 1

## 2013-08-29 MED ORDER — MORPHINE SULFATE 2 MG/ML IJ SOLN: 2.0000 mg | INTRAMUSCULAR | Status: DC | PRN

## 2013-08-29 MED ORDER — WARFARIN SODIUM 5 MG PO TABS
5.0000 mg | ORAL_TABLET | Freq: Once | ORAL | Status: AC
  Administered 2013-08-29: 5 mg via ORAL
  Filled 2013-08-29: qty 1

## 2013-08-29 MED ORDER — FERROUS SULFATE 325 (65 FE) MG PO TABS
325.0000 mg | ORAL_TABLET | Freq: Two times a day (BID) | ORAL | Status: DC
  Administered 2013-08-29 – 2013-09-01 (×6): 325 mg via ORAL
  Filled 2013-08-29 (×8): qty 1

## 2013-08-29 MED ORDER — PHENOL 1.4 % MT LIQD: 1.0000 | OROMUCOSAL | Status: DC | PRN

## 2013-08-29 MED ORDER — SODIUM CHLORIDE 0.9 % IJ SOLN
INTRAMUSCULAR | Status: AC
  Filled 2013-08-29: qty 10

## 2013-08-29 MED ORDER — CEFAZOLIN SODIUM-DEXTROSE 2-3 GM-% IV SOLR
2.0000 g | Freq: Four times a day (QID) | INTRAVENOUS | Status: AC
  Administered 2013-08-29 (×2): 2 g via INTRAVENOUS
  Filled 2013-08-29 (×2): qty 50

## 2013-08-29 MED ORDER — KETAMINE HCL 10 MG/ML IJ SOLN
INTRAMUSCULAR | Status: DC | PRN
  Administered 2013-08-29: 25 mg via INTRAVENOUS

## 2013-08-29 MED ORDER — EPHEDRINE SULFATE 50 MG/ML IJ SOLN
INTRAMUSCULAR | Status: AC
  Filled 2013-08-29: qty 1

## 2013-08-29 MED ORDER — 0.9 % SODIUM CHLORIDE (POUR BTL) OPTIME
TOPICAL | Status: DC | PRN
  Administered 2013-08-29: 1000 mL

## 2013-08-29 MED ORDER — VANCOMYCIN HCL 10 G IV SOLR
1500.0000 mg | INTRAVENOUS | Status: DC
  Administered 2013-08-30 – 2013-09-01 (×3): 1500 mg via INTRAVENOUS
  Filled 2013-08-29 (×3): qty 1500

## 2013-08-29 MED ORDER — VANCOMYCIN HCL 10 G IV SOLR
1500.0000 mg | Freq: Once | INTRAVENOUS | Status: AC
  Administered 2013-08-29: 1500 mg via INTRAVENOUS
  Filled 2013-08-29: qty 1500

## 2013-08-29 MED ORDER — PROPOFOL 10 MG/ML IV BOLUS
INTRAVENOUS | Status: DC | PRN
  Administered 2013-08-29: 150 mg via INTRAVENOUS

## 2013-08-29 MED ORDER — ISOSORBIDE MONONITRATE ER 30 MG PO TB24
30.0000 mg | ORAL_TABLET | Freq: Every morning | ORAL | Status: DC
  Administered 2013-08-29 – 2013-09-01 (×4): 30 mg via ORAL
  Filled 2013-08-29 (×4): qty 1

## 2013-08-29 MED ORDER — ALPRAZOLAM 0.5 MG PO TABS
0.5000 mg | ORAL_TABLET | Freq: Three times a day (TID) | ORAL | Status: DC | PRN
  Administered 2013-08-29 – 2013-09-01 (×5): 0.5 mg via ORAL
  Filled 2013-08-29 (×5): qty 1

## 2013-08-29 MED ORDER — CEFAZOLIN SODIUM-DEXTROSE 2-3 GM-% IV SOLR
INTRAVENOUS | Status: AC
Start: 2013-08-29 — End: 2013-08-29
  Filled 2013-08-29: qty 50

## 2013-08-29 MED ORDER — ALLOPURINOL 300 MG PO TABS
300.0000 mg | ORAL_TABLET | Freq: Two times a day (BID) | ORAL | Status: DC
  Administered 2013-08-29 – 2013-09-01 (×6): 300 mg via ORAL
  Filled 2013-08-29 (×8): qty 1

## 2013-08-29 SURGICAL SUPPLY — 49 items
ADH SKN CLS APL DERMABOND .7 (GAUZE/BANDAGES/DRESSINGS) ×1
BAG SPEC THK2 15X12 ZIP CLS (MISCELLANEOUS) ×1
BAG ZIPLOCK 12X15 (MISCELLANEOUS) ×3 IMPLANT
BLADE SAW SGTL 18X1.27X75 (BLADE) ×2 IMPLANT
BLADE SAW SGTL 18X1.27X75MM (BLADE) ×1
CAPT HIP FX BIPOLAR/UNIPOLAR ×3 IMPLANT
CLOSURE WOUND 1/2 X4 (GAUZE/BANDAGES/DRESSINGS) ×2
DERMABOND ADVANCED (GAUZE/BANDAGES/DRESSINGS) ×2
DERMABOND ADVANCED .7 DNX12 (GAUZE/BANDAGES/DRESSINGS) ×1 IMPLANT
DRAPE INCISE IOBAN 85X60 (DRAPES) ×3 IMPLANT
DRAPE ORTHO SPLIT 77X108 STRL (DRAPES) ×6
DRAPE POUCH INSTRU U-SHP 10X18 (DRAPES) ×3 IMPLANT
DRAPE SURG 17X11 SM STRL (DRAPES) ×3 IMPLANT
DRAPE SURG ORHT 6 SPLT 77X108 (DRAPES) ×2 IMPLANT
DRAPE U-SHAPE 47X51 STRL (DRAPES) ×3 IMPLANT
DRSG AQUACEL AG ADV 3.5X10 (GAUZE/BANDAGES/DRESSINGS) ×3 IMPLANT
DRSG TEGADERM 4X4.75 (GAUZE/BANDAGES/DRESSINGS) IMPLANT
DURAPREP 26ML APPLICATOR (WOUND CARE) ×3 IMPLANT
ELECT BLADE TIP CTD 4 INCH (ELECTRODE) ×3 IMPLANT
ELECT REM PT RETURN 9FT ADLT (ELECTROSURGICAL) ×3
ELECTRODE REM PT RTRN 9FT ADLT (ELECTROSURGICAL) ×1 IMPLANT
EVACUATOR 1/8 PVC DRAIN (DRAIN) IMPLANT
FACESHIELD LNG OPTICON STERILE (SAFETY) ×12 IMPLANT
GAUZE SPONGE 2X2 8PLY STRL LF (GAUZE/BANDAGES/DRESSINGS) IMPLANT
GLOVE BIOGEL PI IND STRL 7.5 (GLOVE) ×1 IMPLANT
GLOVE BIOGEL PI IND STRL 8 (GLOVE) ×1 IMPLANT
GLOVE BIOGEL PI INDICATOR 7.5 (GLOVE) ×2
GLOVE BIOGEL PI INDICATOR 8 (GLOVE) ×2
GLOVE ECLIPSE 8.0 STRL XLNG CF (GLOVE) IMPLANT
GLOVE ORTHO TXT STRL SZ7.5 (GLOVE) ×6 IMPLANT
GLOVE SURG ORTHO 8.0 STRL STRW (GLOVE) ×3 IMPLANT
GOWN SPEC L3 XXLG W/TWL (GOWN DISPOSABLE) ×6 IMPLANT
GOWN STRL REUS W/TWL LRG LVL3 (GOWN DISPOSABLE) ×3 IMPLANT
HANDPIECE INTERPULSE COAX TIP (DISPOSABLE)
IMMOBILIZER KNEE 20 (SOFTGOODS) IMPLANT
KIT BASIN OR (CUSTOM PROCEDURE TRAY) ×3 IMPLANT
MANIFOLD NEPTUNE II (INSTRUMENTS) ×3 IMPLANT
PACK TOTAL JOINT (CUSTOM PROCEDURE TRAY) ×3 IMPLANT
POSITIONER SURGICAL ARM (MISCELLANEOUS) ×3 IMPLANT
SET HNDPC FAN SPRY TIP SCT (DISPOSABLE) IMPLANT
SPONGE GAUZE 2X2 STER 10/PKG (GAUZE/BANDAGES/DRESSINGS)
STRIP CLOSURE SKIN 1/2X4 (GAUZE/BANDAGES/DRESSINGS) ×4 IMPLANT
SUT ETHIBOND NAB CT1 #1 30IN (SUTURE) IMPLANT
SUT MNCRL AB 4-0 PS2 18 (SUTURE) ×3 IMPLANT
SUT VIC AB 1 CT1 36 (SUTURE) ×9 IMPLANT
SUT VIC AB 2-0 CT1 27 (SUTURE) ×6
SUT VIC AB 2-0 CT1 TAPERPNT 27 (SUTURE) ×2 IMPLANT
TOWEL OR 17X26 10 PK STRL BLUE (TOWEL DISPOSABLE) ×6 IMPLANT
TRAY FOLEY CATH 14FRSI W/METER (CATHETERS) IMPLANT

## 2013-08-29 NOTE — Progress Notes (Signed)
ANTICOAGULATION CONSULT NOTE - Initial Consult  Pharmacy Consult for warfarin Indication: hx DVT, s/p hip fx repair  Allergies  Allergen Reactions  . Bee Venom Hives  . Codeine Palpitations    Per patient, "Feels like I'm having a heart attack."    Patient Measurements: Height: 5\' 7"  (170.2 cm) Weight: 240 lb 8.4 oz (109.1 kg) IBW/kg (Calculated) : 61.6  Vital Signs: Temp: 98.7 F (37.1 C) (01/18 1424) Temp src: Oral (01/18 1420) BP: 123/74 mmHg (01/18 1424) Pulse Rate: 80 (01/18 1424)  Labs:  Recent Labs  08/29/13 0747  HGB 9.3*  HCT 29.6*  PLT 127*  APTT 36  LABPROT 16.8*  INR 1.40  CREATININE 1.01    Estimated Creatinine Clearance: 65 ml/min (by C-G formula based on Cr of 1.01).   Medical History: Past Medical History  Diagnosis Date  . Other chest pain   . Other chronic pain   . Esophageal reflux   . Unspecified essential hypertension   . Personal history of pulmonary embolism   . Myalgia and myositis, unspecified   . Coronary atherosclerosis of native coronary artery     CATHETERIZATION X 3  . Dysthymic disorder   . Morbid obesity   . Type II or unspecified type diabetes mellitus without mention of complication, not stated as uncontrolled   . Fibromyalgia   . GERD (gastroesophageal reflux disease)   . Anxiety   . Myocardial infarction   . TIA (transient ischemic attack)   . Acute kidney injury   . Complication of anesthesia   . PONV (postoperative nausea and vomiting)     Medications:  Scheduled:  . allopurinol  300 mg Oral BID  .  ceFAZolin (ANCEF) IV  2 g Intravenous Q6H  . cephALEXin  500 mg Oral TID  . citalopram  20 mg Oral q morning - 10a  . docusate sodium  100 mg Oral BID  . [START ON 08/30/2013] enoxaparin (LOVENOX) injection  40 mg Subcutaneous Q24H  . fentaNYL  100 mcg Transdermal Q72H  . ferrous sulfate  325 mg Oral BID  . gabapentin  300 mg Oral TID  . hydrocortisone  25 mg Rectal Q12H  . [START ON 08/30/2013] hydrocortisone  sodium succinate  50 mg Intrathecal Once  . HYDROmorphone      . isosorbide mononitrate  30 mg Oral q morning - 10a  . levothyroxine  50 mcg Oral QAC breakfast  . metoprolol tartrate  12.5 mg Oral BID  . pantoprazole  40 mg Oral Daily  . polyethylene glycol  17 g Oral BID  . potassium chloride SA  20 mEq Oral BID  . [START ON 08/30/2013] predniSONE  20 mg Oral Q breakfast  . thiamine  100 mg Oral Daily  . torsemide  5 mg Oral q morning - 10a  . [START ON 08/30/2013] vancomycin  1,500 mg Intravenous Q24H   Infusions:  . sodium chloride 50 mL/hr at 08/29/13 0608  . lactated ringers    . sodium chloride 0.9 % 1,000 mL with potassium chloride 10 mEq infusion 50 mL/hr at 08/29/13 1400    Assessment: 73 yo female fell prior to admission and found to have right hip fx s/p R hemi arthroplasty today 1/18. Patient was on warfarin 5mg  qpm PTA for hx DVT s/p IVC filter. Last dose was 1/16 but INR was only 1.4 this AM anyway. Lovenox 40mg  q24 to be started tomorrow AM and warfarin also restarted tonight.  Goal of Therapy:  INR 2-3  Plan:  1) Warfarin 5mg  tonight 2) D/C Lovenox when INR > 2 3) Daily INR   Adrian Saran, PharmD, BCPS Pager [phone] 08/29/2013 3:58 PM

## 2013-08-29 NOTE — Anesthesia Preprocedure Evaluation (Addendum)
Anesthesia Evaluation  Patient identified by MRN, date of birth, ID band Patient awake    Reviewed: Allergy & Precautions, H&P , NPO status , Patient's Chart, lab work & pertinent test results, reviewed documented beta blocker date and time   History of Anesthesia Complications (+) PONV  Airway Mallampati: II TM Distance: >3 FB Neck ROM: full    Dental  (+) Edentulous Upper, Edentulous Lower and Dental Advisory Given   Pulmonary shortness of breath and with exertion,  History PE breath sounds clear to auscultation  Pulmonary exam normal       Cardiovascular hypertension, Pt. on home beta blockers + CAD and + Past MI Rhythm:regular Rate:Normal     Neuro/Psych  Headaches, Anxiety Depression Back surgery TIAnegative psych ROS   GI/Hepatic negative GI ROS, Neg liver ROS, GERD-  Medicated and Controlled,  Endo/Other  diabetes, Type 2Morbid obesityDiet controlled DM  Renal/GU negative Renal ROS  negative genitourinary   Musculoskeletal  (+) Fibromyalgia -, narcotic dependent  Abdominal (+) + obese,   Peds  Hematology negative hematology ROS (+) anemia , Hodgkin's Lymphoma. Hgb 9.3   Anesthesia Other Findings   Reproductive/Obstetrics negative OB ROS                         Anesthesia Physical Anesthesia Plan  ASA: III  Anesthesia Plan: General   Post-op Pain Management:    Induction: Intravenous  Airway Management Planned: Oral ETT  Additional Equipment:   Intra-op Plan:   Post-operative Plan: Extubation in OR  Informed Consent: I have reviewed the patients History and Physical, chart, labs and discussed the procedure including the risks, benefits and alternatives for the proposed anesthesia with the patient or authorized representative who has indicated his/her understanding and acceptance.   Dental Advisory Given  Plan Discussed with: CRNA and Surgeon  Anesthesia Plan Comments:          Anesthesia Quick Evaluation

## 2013-08-29 NOTE — Transfer of Care (Signed)
Immediate Anesthesia Transfer of Care Note  Patient: [name] [name]  Procedure(s) Performed: Procedure(s): HEMI ARTHROPLASTY right hip (Right)  Patient Location: PACU  Anesthesia Type:General  Level of Consciousness: awake, alert , oriented and patient cooperative  Airway & Oxygen Therapy: Patient Spontanous Breathing and Patient connected to face mask oxygen  Post-op Assessment: Report given to PACU RN and Post -op Vital signs reviewed and stable  Post vital signs: stable  Complications: No apparent anesthesia complications

## 2013-08-29 NOTE — H&P (Signed)
[facility] History and Physical  Patient: [name] [name]  WCB:[id number]  DOB: Jan 22, 1941  DOS: the patient was seen and examined on 08/29/2013 PCP: [name], MD  Chief Complaint: Mechanical fall  HPI: [name] [name] is a 73 y.o. 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 The patient presented with an episode of fall that happen at home. She mentions that she is generally wheelchair-bound but can be what herself from wheelchair to bed and vice versa. Today she woke up from her sleep and wanted to go to the restroom at which time she tried to move herself from the bed to the wheelchair. She felt the wheelchair was locked and as soon as she stood up the wheelchair give away and she fell on the right knee and then on her right side. She was in significant pain and could not stand up and therefore she asked for help by the time her husband arrived he found her on the ground and they called 911 and she was taken to [facility]. She had some cough that started early this morning with yellow sputum production. She denies any fever or chills or chest pain. She has baseline shortness of breath which is not getting worse and stable  She had an episode of nausea, one episode of vomiting without any blood, diffuse lower abdominal pain, few episode of diarrhea without any blood Friday night which resolved on Saturday morning. She has on and off dizziness at her baseline. At her baseline she is unable to more her legs due to her paraplegia from prior spinal surgery. In early January 5 she mentions she was admitted to [facility] for right leg cellulitis and she was placed on  Keflex and discharged on Saturday after 5 day hospital stay which he finished for 5 more days. She mentions the redness that started on her right leg has improved and then is him back on the left leg since last 2 days. She denies any other focal neurological deficit. Review of Systems: as mentioned in the history of present illness.  A Comprehensive review of the other systems is negative.  Past Medical History  Diagnosis Date  . Other chest pain   . Other chronic pain   . Esophageal reflux   . Unspecified essential hypertension   . Personal history of pulmonary embolism   . Myalgia and myositis, unspecified   . Coronary atherosclerosis of native coronary artery     CATHETERIZATION X 3  . Dysthymic disorder   . Morbid obesity   . Type II or unspecified type diabetes mellitus without mention of complication, not stated as uncontrolled   . Fibromyalgia   . GERD (gastroesophageal reflux disease)   . Anxiety   . Myocardial infarction   . TIA (transient ischemic attack)   . Acute kidney injury   . Complication of anesthesia   . PONV (postoperative nausea and vomiting)    Past Surgical History  Procedure Laterality Date  . Back surgery      X 2 FOR DISK PROBLEMS. three total back surgery  . Abdominal hysterectomy    . Gastric bypass      HISTORY OF  . Carpal tunnel release  bilateral  . Bladder suspension    . Cholecystectomy    . Colonoscopy      Three, h/o polyps. last one ?2 years ago Dr. Britta Mccreedy  . Esophagogastroduodenoscopy      remote  . Colonoscopy, esophagogastroduodenoscopy (egd) and esophageal dilation N/A 01/23/2013    [id number] hemorrhoids otherwise normal colonoscopy/Mild changes of esophagitis at GE junction(focal erythema).Small gastric pouch with patent gastrojejunostomy.Normal mucosa of proximal small bowel  . Givens capsule study N/A 01/24/2013    Procedure: GIVENS CAPSULE STUDY;  Surgeon: Rogene Houston, MD;  [facility];  Service: Endoscopy;   Laterality: N/A;   Social History:  reports that she has never smoked. She has never used smokeless tobacco. She reports that she does not drink alcohol or use illicit drugs. Independent for most of her  ADL.  Allergies  Allergen Reactions  . Bee Venom Hives  . Codeine Palpitations    Per patient, "Feels like I'm having a heart attack."    Family History  Problem Relation Age of Onset  . Coronary artery disease      FAMILY HISTORY  . Diabetes type II Mother   . Heart disease Mother   . Lymphoma Father   . Colon cancer Sister     late 73s    Prior to Admission medications   Medication Sig Start Date End Date Taking? Authorizing Provider  allopurinol (ZYLOPRIM) 300 MG tablet Take 300 mg by mouth 2 (two) times daily.   Yes Historical Provider, MD  isosorbide dinitrate (ISORDIL) 30 MG tablet Take 30 mg by mouth daily.   Yes Historical Provider, MD  levothyroxine (SYNTHROID, LEVOTHROID) 50 MCG tablet Take 50 mcg by mouth daily before breakfast.   Yes Historical Provider, MD  torsemide (DEMADEX) 20 MG tablet Take 60 mg by mouth daily.   Yes Historical Provider, MD  ALPRAZolam Duanne Moron) 0.5 MG tablet Take 0.5 mg by mouth 3 (three) times daily as needed for anxiety. For anxiety    Historical Provider, MD  citalopram (CELEXA) 20 MG tablet Take 20 mg by mouth every morning.  02/09/13   Historical Provider, MD  fentaNYL (DURAGESIC - DOSED MCG/HR) 100 MCG/HR Place 1 patch onto the skin every 3 (three) days.    Historical Provider, MD  ferrous sulfate 325 (65 FE) MG tablet Take 1 tablet (325 mg total) by mouth 2 (two) times daily. 03/30/13   Orvil Feil, NP  gabapentin (NEURONTIN) 300 MG capsule Take 1 capsule by mouth 3 (three) times daily. 08/23/13   Historical Provider, MD  hydrocortisone (ANUSOL-HC) 2.5 % rectal cream Place rectally 2 (two) times daily. 02/11/13   Mahala Menghini, PA-C  hydrocortisone (ANUSOL-HC) 25 MG suppository Place 1 suppository (25 mg total) rectally every 12 (twelve) hours.  02/10/13   Orvil Feil, NP  lubiprostone (AMITIZA) 8 MCG capsule Take 1 capsule (8 mcg total) by mouth 2 (two) times daily with a meal. 04/28/13   Orvil Feil, NP  methocarbamol (ROBAXIN) 750 MG tablet Take 750 mg by mouth as needed.    Historical Provider, MD  metoprolol tartrate (LOPRESSOR) 25 MG tablet Take 12.5 mg by mouth 2 (two) times daily.    Historical Provider, MD  omeprazole (PRILOSEC) 40 MG capsule Take 40 mg by mouth daily.    Historical Provider, MD  oxyCODONE (OXY IR/ROXICODONE) 5 MG immediate release tablet Take 10 mg by mouth every 4 (four) hours as needed for pain.     Historical Provider, MD  potassium chloride (  K-DUR) 10 MEQ tablet Take 10 mEq by mouth daily.    Historical Provider, MD  thiamine (VITAMIN B-1) 100 MG tablet Take 1 tablet (100 mg total) by mouth daily. 03/30/13   Orvil Feil, NP  vitamin B-12 (CYANOCOBALAMIN) 1000 MCG tablet Take 1,000 mcg by mouth every morning.     Historical Provider, MD  warfarin (COUMADIN) 5 MG tablet Take 1 tablet by mouth every evening. 07/30/13   Historical Provider, MD    Physical Exam: Filed Vitals:   08/29/13 0420 08/29/13 0500  BP: 113/69   Pulse: 96   Temp: 99.4 F (37.4 C)   TempSrc: Oral   Resp: 16   Height:  5\' 7"  (1.702 m)  Weight:  109.1 kg (240 lb 8.4 oz)  SpO2: 90%     General: Alert, Awake and Oriented to Time, Place and Person. Appear in marked distress Eyes: PERRL ENT: Oral Mucosa clear dry. Neck: No JVD Cardiovascular: S1 and S2 Present, aortic systolic Murmur, Peripheral Pulses Present Respiratory: Bilateral Air entry equal and Decreased, Clear to Auscultation,  No Crackles, no wheezes Abdomen: Bowel Sound Present, Soft and minimally tender on lower quadrant more on the left Skin: No Rash Extremities: Bilaterally Pedal edema, no calf tenderness, redness present left more than right Neurologic: Grossly Unremarkable. Other than paraplegia  Labs on Admission:  CBC: WBC 11.1 Hemoglobin 11.1 Platelets  142  CMP  Sodium 137, potassium 4.3, chloride 101, CO2 29, creatinine 1.09, glucose 127, calcium 8.2 INR 1.5 PTT 31.6 Urine WBC 5-10, nitrite negative  Radiological Exams on Admission: CT of the right hip Nondisplaced, impacted subcapital transcervical right femoral neck fracture  Assessment/Plan Principal Problem:   Hip fracture Active Problems:   HYPERTENSION   Cellulitis   History of DVT of lower extremity   Anemia   Paraplegia following spinal cord injury   Hodgkin's lymphoma   Fibromyalgia   1. Hip fracture The patient has presented with a mechanical fall. She denies any head trauma or neck pain. She denies any other focal neurological deficit which is new for her nausea chest pain or palpitation. She has undergone a CT scan of her hip on the right side which shows above-mentioned fracture. Orthopedics has been consulted who will see the patient early in the morning. Patient will be kept n.p.o. For pain control I would continue her on home fentanyl and Dilaudid as needed.  2. Cellulitis The patient has redness that involves the left leg on the lateral aspect it appears warm to touch as compared to the other side on the right. There is a high suspicion for bilateral cellulitis for which I will obtain blood culture and put her on IV vancomycin since she has recently been in the hospital. I have requested records from [facility] which at the time of this dictation has not arrived yet.  3. History of DVT with IVC filter She mentions she has a history of reciprocal since 2010. Her INR was subtherapeutic. She'll resume Coumadin post surgery.  4. History of hypertension and diastolic dysfunction I would continue her atenolol and hold her amlodipine as well as Demadex  5. History of Hodgkin's lipoma Patient is ongoing on chemotherapy. Will get further records from [place]: Orthopedics, appreciate input  DVT Prophylaxis: subcutaneous  Heparin Nutrition: N.p.o.  Code Status: Full  Disposition: Admitted to inpatient in med-surge unit.  Author: Berle Mull, MD [facility] Pager: [phone] 08/29/2013, 5:15 AM    If 7PM-7AM, please contact night-coverage [email or web address] Password  [email or web address]

## 2013-08-29 NOTE — Consult Note (Signed)
Reason for Consult: Right hip, femoral neck fracture Referring Physician:  Clementeen Graham, MD  [name] [name] is an 73 y.o. female.  PCP: [name], MD  Chief Complaint: Mechanical fall with right hip pain HPI: EDDIS PINGLETON is a 73 y.o. 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  The patient presented with an episode of fall that happen at home. She mentions that she is generally wheelchair-bound but can be what herself from wheelchair to bed and vice versa. Today she woke up from her sleep and wanted to go to the restroom at which time she tried to move herself from the bed to the wheelchair. She felt the wheelchair was locked and as soon as she stood up the wheelchair give away and she fell on the right knee and then on her right side. She was in significant pain and could not stand up and therefore she asked for help by the time her husband arrived he found her on the ground and they called 911 and she was taken to the [facility].   Due to the lack of Orthopaedic coverage in [place] we were contacted through [place] to accept Orthopaedic care.  She was transferred to [facility] for admission to the medical service and Orthopaedic consultation     Past Medical History  Diagnosis Date  . Other chest pain   . Other chronic pain   . Esophageal reflux   . Unspecified essential hypertension   . Personal history of pulmonary embolism   . Myalgia and myositis, unspecified   . Coronary atherosclerosis of native coronary artery     CATHETERIZATION X 3  . Dysthymic disorder   . Morbid obesity   . Type II or unspecified type diabetes mellitus without mention of complication, not  stated as uncontrolled   . Fibromyalgia   . GERD (gastroesophageal reflux disease)   . Anxiety   . Myocardial infarction   . TIA (transient ischemic attack)   . Acute kidney injury   . Complication of anesthesia   . PONV (postoperative nausea and vomiting)     Past Surgical History  Procedure Laterality Date  . Back surgery      X 2 FOR DISK PROBLEMS. three total back surgery  . Abdominal hysterectomy    . Gastric bypass      HISTORY OF  . Carpal tunnel release      bilateral  . Bladder suspension    . Cholecystectomy    . Colonoscopy      Three, h/o polyps. last one ?2 years ago Dr. Britta Mccreedy  . Esophagogastroduodenoscopy      remote  . Colonoscopy, esophagogastroduodenoscopy (egd) and esophageal dilation N/A 01/23/2013    NOB:[id number] hemorrhoids otherwise normal colonoscopy/Mild changes of esophagitis at GE junction(focal erythema).Small gastric pouch with patent gastrojejunostomy.Normal mucosa of proximal small bowel  . Givens capsule study N/A 01/24/2013    Procedure: GIVENS CAPSULE STUDY;  Surgeon: Rogene Houston, MD;  Location[facility];  Service: Endoscopy;  Laterality: N/A;    Family History  Problem Relation Age of Onset  . Coronary artery disease      FAMILY HISTORY  . Diabetes type II Mother   . Heart disease Mother   .  Lymphoma Father   . Colon cancer Sister     late 8s    Social History:  reports that she has never smoked. She has never used smokeless tobacco. She reports that she does not drink alcohol or use illicit drugs.  Allergies:  Allergies  Allergen Reactions  . Bee Venom Hives  . Codeine Palpitations    Per patient, "Feels like I'm having a heart attack."    Medications:  I have reviewed the patient's current medications. Scheduled: . allopurinol  300 mg Oral BID  . citalopram  20 mg Oral q morning - 10a  . enoxaparin (LOVENOX) injection  30 mg Subcutaneous Q24H  . fentaNYL  100 mcg Transdermal Q72H  . ferrous sulfate  325 mg Oral  BID  . gabapentin  300 mg Oral TID  . hydrocortisone  25 mg Rectal Q12H  . levothyroxine  50 mcg Oral QAC breakfast  . metoprolol tartrate  12.5 mg Oral BID  . pantoprazole  40 mg Oral Daily  . thiamine  100 mg Oral Daily  . vancomycin  1,500 mg Intravenous Once    No results found for this or any previous visit (from the past 24 hour(s)).   X-ray: Plain films and CT of right hip had been ordered revealing femoral neck fracture with posterior neck comminution and anterior angulation at fracture site  ROS Review of Systems: as mentioned in the history of present illness.  She had some cough that started early this morning with yellow sputum production. She denies any fever or chills or chest pain.  She has baseline shortness of breath which is not getting worse and stable  She had an episode of nausea, one episode of vomiting without any blood, diffuse lower abdominal pain, few episode of diarrhea without any blood Friday night which resolved on Saturday morning.  She has on and off dizziness at her baseline.  At her baseline she is unable to more her legs due to her paraplegia from prior spinal surgery.  In early January 5 she mentions she was admitted to [facility] for right leg cellulitis and she was placed on Keflex and discharged on Saturday after 5 day hospital stay which he finished for 5 more days. She mentions the redness that started on her right leg has improved and then is him back on the left leg since last 2 days. She denies any other focal neurological deficit.  Blood pressure 126/70, pulse 88, temperature 99.2 F (37.3 C), temperature source Oral, resp. rate 17, height 5\' 7"  (1.702 m), weight 109.1 kg (240 lb 8.4 oz), SpO2 96.00%.  Physical Exam Filed Vitals:    08/29/13 0420  08/29/13 0500   BP:  113/69    Pulse:  96    Temp:  99.4 F (37.4 C)    TempSrc:  Oral    Resp:  16    Height:   5\' 7"  (1.702 m)   Weight:   109.1 kg (240 lb 8.4 oz)   SpO2:  90%      General: Alert, Awake and Oriented to Time, Place and Person. Appear in marked distress, obese Eyes: PERRL  ENT: Oral Mucosa clear dry.  Neck: No JVD  Cardiovascular: S1 and S2 Present, aortic systolic Murmur, Peripheral Pulses Present  Respiratory: Bilateral Air entry equal and Decreased, Clear to Auscultation,  No Crackles, no wheezes  Abdomen: Bowel Sound Present, Soft and minimally tender on lower quadrant more on the left,  obese Skin: No Rash  Extremities:  Bilaterally Pedal edema, no calf tenderness, redness present left more than right   Pain with right hip movement Neurologic: Grossly Unremarkable. Other than paraplegia   Assessment/Plan: Right hip, femoral neck fracture  Based on presentation, examination and radiographic evaluation she will need treatment of her hip fracture Based on her medical history, surgical history and current limited activity level as a result of these I recommend that she have a right hip hemiarthroplasty performed to treat fracture allow for unprotected activity and limit possible need for future surgery  NPO Plan is to go surgery today Consent to be obtained  Aleesa Sweigert D 08/29/2013, 7:52 AM

## 2013-08-29 NOTE — Op Note (Signed)
[name] [name], [name] [name]               ACCOUNT NO.:  [id number]  MEDICAL RECORD NO.:  [id number]  LOCATION:  2951                         FACILITY:  [facility]  PHYSICIAN:  Pietro Cassis. Alvan Dame, M.D.  DATE OF BIRTH:  1941-01-16  DATE OF PROCEDURE:  08/29/2013 DATE OF DISCHARGE:                              OPERATIVE REPORT   PREOPERATIVE DIAGNOSIS:  Right hip fracture, femoral neck fracture.  POSTOPERATIVE DIAGNOSIS:  Right hip fracture, femoral neck fracture.  PROCEDURE:  Right hip hemiarthroplasty.  COMPONENTS USED:  DePuy size 8 standard Summit basic press-fit femoral component, with a 52 unipolar ball, and a 0 adapter.  SURGEON:  Pietro Cassis. Alvan Dame, MD  ASSISTANT:  Surgical team.  ANESTHESIA:  General.  SPECIMENS:  None.  COMPLICATIONS:  None.  DRAINS:  None.  BLOOD LOSS:  About 200 mL.  INDICATIONS FOR PROCEDURE:  [name] [name] is a 73 year old female, currently with medical comorbidities included obesity status post gastric bypass surgery currently still weighed about 250 pounds, in addition to currently undergoing chemotherapy for a lymphoma.  She unfortunately had a fall as she is very limited in her activity when her wheelchair slipped away from her and she fell on her right side.  She had immediate onset of pain and was taken initially to [facility] and was subsequently transferred to [facility] based on lack of orthopedic coverage.  She was seen and evaluated and surgical indications discussed and risk with she and her family.  Consent was obtained for benefit of pain control and mobility for a hemiarthroplasty as opposed to other options available.  Risks of infection, increased risk for DVT with history of vena cava filter previously placed were discussed and reviewed.  Consent was obtained for benefit of fracture repair and pain.  PROCEDURE IN DETAIL:  The patient was brought to operative theater. Once adequate anesthesia, preoperative antibiotics, and  Ancef administered, she was positioned into the left lateral decubitus position with the right side up.  The right lower extremity was then prepped and draped in a sterile fashion.  Time-out was performed identifying the patient, planned procedure, and extremity.  A lateral based incision was made over the trochanter.  She was noted to have approximately 3 inches of adipose tissue down to the trochanter with significant edematous changes to the bone and noted edematous fluid in this wound area.  The iliotibial band and gluteal fascia were then split in line with the incision for a posterior approach to the hip.  The posterior capsule and short external rotators were taken down as a single layer.  The fracture site identified, the hip was internally rotated, and neck osteotomy made into the trochanteric fossa, and the remaining comminuted femoral neck segment removed.  The femoral head was then removed and measured on the back table to be 52-mm in diameter.  Following proximal femoral exposure and a retractor were placed.  The proximal femur was opened with a starting drill and hand reamed once and then irrigated to try to prevent fat emboli.  I then began broaching with a size 1 broach and broached all the way up to a size 8 broach with good medial and lateral metaphyseal fit with  an anteversion set at 20-25 degrees.  Given this, the orientation of her neck cut, the location of the femoral stem and trial, I selected the 8 standard Summit basic stem, it was opened.  The canal was re-irrigated, at this point, the final stem was impacted to the level of the neck cut with a collared prosthesis in place.  At this point, I did a trial reduction with a 52 unipolar ball with 0 Adapter and found that her hip was stable and the leg lengths were equal to her down leg.  Given these findings, we removed the trial components following dislocation, selected a 52 unipolar ball with a 0 Adapter.  The 2  were impacted together on the back table and then as a single unipolar ball impacted onto a clean and dry trunnion.  The hip was then reduced.  I reapproximated the posterior capsule with #1 Vicryl.  No Hemovac drain was placed nor needed.  The iliotibial band was then reapproximated using interrupted #1 Vicryl sutures.  The remainder of the wound was closed with 2-0 Vicryl and then a running 3-0 Monocryl.  The hip wound was then cleaned, dried, and dressed sterilely using Dermabond and Aquacel dressing.  She was brought to recovery room in stable condition and extubated.  She is on prednisone daily and was given a 100 mg Solu-Medrol hydrocortisone dose and will get a 50 mg dose tomorrow.  She will be placed on DVT prophylaxis including SCDs and chemo prophylaxis. Findings were reviewed with family.  She will be weightbearing as tolerated due to limited activity, nonetheless.     Pietro Cassis Alvan Dame, M.D.     MDO/MEDQ  D:  08/29/2013  T:  08/29/2013  Job:  [id number]

## 2013-08-29 NOTE — Progress Notes (Signed)
pacu nursing:   Portable pelvis xray done

## 2013-08-29 NOTE — Anesthesia Postprocedure Evaluation (Signed)
  Anesthesia Post-op Note  Patient: [name] [name]  Procedure(s) Performed: Procedure(s) (LRB): HEMI ARTHROPLASTY right hip (Right)  Patient Location: PACU  Anesthesia Type: General  Level of Consciousness: awake and alert   Airway and Oxygen Therapy: Patient Spontanous Breathing  Post-op Pain: mild  Post-op Assessment: Post-op Vital signs reviewed, Patient's Cardiovascular Status Stable, Respiratory Function Stable, Patent Airway and No signs of Nausea or vomiting  Last Vitals:  Filed Vitals:   08/29/13 1303  BP: 146/66  Pulse: 74  Temp: 36.7 C  Resp: 12    Post-op Vital Signs: stable   Complications: No apparent anesthesia complications

## 2013-08-29 NOTE — Progress Notes (Signed)
ANTIBIOTIC CONSULT NOTE - INITIAL  Pharmacy Consult for vancomycin Indication: cellulitis  Allergies  Allergen Reactions  . Bee Venom Hives  . Codeine Palpitations    Per patient, "Feels like I'm having a heart attack."    Patient Measurements: Height: 5\' 7"  (170.2 cm) Weight: 240 lb 8.4 oz (109.1 kg) IBW/kg (Calculated) : 61.6  Vital Signs: Temp: 99.2 F (37.3 C) (01/18 0548) Temp src: Oral (01/18 0548) BP: 126/70 mmHg (01/18 0548) Pulse Rate: 88 (01/18 0548) Intake/Output from previous day: 01/17 0701 - 01/18 0700 In: -  Out: 500 [Urine:500] Intake/Output from this shift:    Labs:  Recent Labs  08/29/13 0747  WBC 8.3  HGB 9.3*  PLT 127*  CREATININE 1.01   Estimated Creatinine Clearance: 65 ml/min (by C-G formula based on Cr of 1.01). No results found for this basename: VANCOTROUGH, VANCOPEAK, VANCORANDOM, GENTTROUGH, GENTPEAK, GENTRANDOM, TOBRATROUGH, TOBRAPEAK, TOBRARND, AMIKACINPEAK, AMIKACINTROU, AMIKACIN,  in the last 72 hours   Microbiology: No results found for this or any previous visit (from the past 720 hour(s)).  Medical History: Past Medical History  Diagnosis Date  . Other chest pain   . Other chronic pain   . Esophageal reflux   . Unspecified essential hypertension   . Personal history of pulmonary embolism   . Myalgia and myositis, unspecified   . Coronary atherosclerosis of native coronary artery     CATHETERIZATION X 3  . Dysthymic disorder   . Morbid obesity   . Type II or unspecified type diabetes mellitus without mention of complication, not stated as uncontrolled   . Fibromyalgia   . GERD (gastroesophageal reflux disease)   . Anxiety   . Myocardial infarction   . TIA (transient ischemic attack)   . Acute kidney injury   . Complication of anesthesia   . PONV (postoperative nausea and vomiting)     Medications:  Scheduled:  . allopurinol  300 mg Oral BID  . citalopram  20 mg Oral q morning - 10a  . enoxaparin (LOVENOX)  injection  30 mg Subcutaneous Q24H  . fentaNYL  100 mcg Transdermal Q72H  . ferrous sulfate  325 mg Oral BID  . gabapentin  300 mg Oral TID  . hydrocortisone  25 mg Rectal Q12H  . levothyroxine  50 mcg Oral QAC breakfast  . metoprolol tartrate  12.5 mg Oral BID  . pantoprazole  40 mg Oral Daily  . thiamine  100 mg Oral Daily   Infusions:  . sodium chloride 50 mL/hr at 08/29/13 0608   Assessment: 73 yo female presented to ER with mechanical fall to have surgery today for repair of hip fracture today by ortho. Patient with hx HL with ongoing chemo last given 2 weeks ago per notes. Patient is treated at [facility] so unsure what agent she is getting at this point. Patient also with hx DVT s/p IVC filter on warfarin PTA that will be resumed post op ortho surgery most likely. Pharmacy consulted for treatment of bilateral cellulitis with vancomycin. WBC currently WNL. Afebrile. SCr of 1.01 so estimated CrCl of 65 ml/min (57 ml/min/1.46m2)  Goal of Therapy:  Vancomycin trough level 10-15 mcg/ml  Plan:  1) Vancomycin 1500mg  IV x 1 already ordered this AM and given at 6am 2) Start vancomycin 1500mg  IV q24 beginning tomorrow AM   Adrian Saran, PharmD, BCPS Pager [phone] 08/29/2013 9:42 AM

## 2013-08-29 NOTE — Brief Op Note (Signed)
08/29/2013  11:17 AM  PATIENT:  [name] [name]  73 y.o. female  PRE-OPERATIVE DIAGNOSIS:  right hip, femoral neck fracture  POST-OPERATIVE DIAGNOSIS:   right hip, femoral neck fracture  PROCEDURE:  Procedure(s): HEMI ARTHROPLASTY right hip (Right) Depuy Summit Basic Pres Fit  SURGEON:  Surgeon(s) and Role:    * Mauri Pole, MD - Primary  PHYSICIAN ASSISTANT: None  ASSISTANTS: Surgical team   ANESTHESIA:   general  EBL:   200 cc  BLOOD ADMINISTERED:none  DRAINS: none   LOCAL MEDICATIONS USED:  NONE  SPECIMEN:  No Specimen  DISPOSITION OF SPECIMEN:  N/A  COUNTS:  YES  TOURNIQUET:  * No tourniquets in log *  DICTATION: .Other Dictation: Dictation Number [id number]  PLAN OF CARE: Admit to inpatient   PATIENT DISPOSITION:  PACU - hemodynamically stable.   Delay start of Pharmacological VTE agent (>24hrs) due to surgical blood loss or risk of bleeding: no

## 2013-08-29 NOTE — Progress Notes (Addendum)
[facility] PROGRESS NOTE  [name] [name] PIR:[id number] DOB: Nov 18, 1940 DOA: 08/29/2013 PCP: [name], MD  Assessment/Plan:  right Hip fracture  S/o eight hip hemiarthroplasty today. Continue pain control with prn dilaudid. Will add IV robaxin for muscle spasms  PT in am  bowel regimen Coumadin for DVT prophylaxis    B/l leg cellulitis.  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 Her INR was subtherapeutic.   resume Coumadin post surgery.   hypertension and diastolic dysfunction   continue her atenolol.  Holding amlodipine as well as Demadex   History of Hodgkin's lymphoma Reports stage IV disease.  Patient is on chemotherapy. Follows  with Dr Evette Cristal at  [facility].     DVT Prophylaxis: subcutaneous Heparin , resume coumadin today Nutrition: cardiac   Code Status: full Family Communication:  Family at bedside Disposition Plan: likely needs SNF   Consultants:  Dr Alvan Dame  Procedures:  Right hip hemiarthorplasty  Antibiotics:  none  HPI/Subjective: Patient seen after returining from OR. reports right hip pain  Objective: Filed Vitals:   08/29/13 1424  BP: 123/74  Pulse: 80  Temp: 98.7 F (37.1 C)  Resp: 16    Intake/Output Summary (Last 24 hours) at 08/29/13 1526 Last data filed at 08/29/13 1400  Gross per 24 hour  Intake    900 ml  Output   1275 ml  Net   -375 ml   Filed Weights   08/29/13 0500  Weight: 109.1 kg (240 lb 8.4 oz)    Exam:   General:  Elderly female in NAD   HEETN: no pallor, moist mucosa  Chest: clear b/l, no added sounds   CVS: YT0&Z6 , 2/6 systolic murmur  ABD: soft, NT, ND, BS+, foley in place  Ext: rt hip dressing  CNS: AAOX3    Data Reviewed: Basic Metabolic Panel:  Recent Labs Lab 08/29/13 0747  NA 137  K 4.6  CL 101  CO2 26  GLUCOSE 116*  BUN 20  CREATININE 1.01  CALCIUM 7.8*   Liver Function  Tests:  Recent Labs Lab 08/29/13 0747  AST 26  ALT 17  ALKPHOS 137*  BILITOT 0.5  PROT 5.2*  ALBUMIN 2.4*   No results found for this basename: LIPASE, AMYLASE,  in the last 168 hours No results found for this basename: AMMONIA,  in the last 168 hours CBC:  Recent Labs Lab 08/29/13 0747  WBC 8.3  HGB 9.3*  HCT 29.6*  MCV 94.3  PLT 127*   Cardiac Enzymes: No results found for this basename: CKTOTAL, CKMB, CKMBINDEX, TROPONINI,  in the last 168 hours BNP (last 3 results) No results found for this basename: PROBNP,  in the last 8760 hours CBG: No results found for this basename: GLUCAP,  in the last 168 hours  Recent Results (from the past 240 hour(s))  SURGICAL PCR SCREEN     Status: None   Collection Time    08/29/13 10:11 AM      Result Value Range Status   MRSA, PCR NEGATIVE  NEGATIVE Final   Staphylococcus aureus NEGATIVE  NEGATIVE Final   Comment:            The Xpert SA Assay (FDA     approved for NASAL specimens     in patients over 72 years of age),     is one component of     a comprehensive surveillance  program.  Test performance has     been validated by [facility] for patients greater     than or equal to 72 year old.     It is not intended     to diagnose infection nor to     guide or monitor treatment.     Studies: US Abdomen Complete  08/29/2013   CLINICAL DATA:  Diffuse left-sided abdominal discomfort, history of cholecystectomy and hysterectomy. Also history of diabetes in lymphoma  EXAM: ULTRASOUND ABDOMEN COMPLETE  COMPARISON:  None.  FINDINGS: Gallbladder:  The gallbladder is surgically absent. There are no abnormal findings in the gallbladder fossa.  Common bile duct:  Diameter: 8.9 mm which is not unusual in the post cholecystectomy state.  Liver:  The echotexture of the liver is mildly increased. There is no focal mass. No intrahepatic ductal dilation is demonstrated. Portal venous flow remains normal in direction toward the liver.   IVC:  No abnormality visualized.  Pancreas:  Visualized portion unremarkable.  Spleen:  The spleen exhibits normal echotexture and measures 8.4 cm in length.  Right Kidney:  Length: 10.4 cm. There is no hydronephrosis. Subjective mild thinning of the cortex is present. There is no focal mass  Left Kidney:  Length: 10.7 cm. There is no evidence of hydronephrosis or focal mass. The cortical echotexture appears normal but there is mild cortical thinning.  Abdominal aorta:  No aneurysm visualized.  Other findings:  No ascites is demonstrated.  IMPRESSION: 1. The gallbladder is surgically absent. No acute abnormality of the liver or pancreas or spleen is demonstrated. 2. The kidneys exhibit no acute abnormalities. There is mild cortical atrophy diffusely. 3. No ascites is demonstrated.   Electronically Signed   By: David  Martinique   On: 08/29/2013 12:04   Dg Pelvis Portable  08/29/2013   CLINICAL DATA:  Postop hip surgery.  EXAM: PORTABLE PELVIS 1-2 VIEWS  COMPARISON:  08/28/2013  FINDINGS: A right hip arthroplasty has been performed. The hip arthroplasty appears to be grossly located on these frontal images. Pelvic bony ring is intact. Postsurgical changes at the lumbosacral junction. No gross abnormality to the left hip.  IMPRESSION: Right hip arthroplasty without complicating features.   Electronically Signed   By: Markus Daft M.D.   On: 08/29/2013 13:56   Dg Chest Portable 1 View  08/29/2013   CLINICAL DATA:  73 year old hypoxia.  EXAM: PORTABLE CHEST - 1 VIEW  COMPARISON:  07/16/2013, 06/28/2013 and chest CT 05/10/2013  FINDINGS: Port-A-Cath tip in the SVC region. Again noted are patchy densities in the right upper lung. These densities have not significantly changed since 06/28/2013 and may be related to known lung lesions. Again noted are coarse lung markings and cannot exclude small lung lesions based on previous CT findings. Heart size is within normal limits.  IMPRESSION: Patchy lung densities, particularly  in the right upper lung. Findings could be related to known lung lesions. Difficult to exclude acute on chronic disease. Lungs may be better characterized with a chest CT.   Electronically Signed   By: Markus Daft M.D.   On: 08/29/2013 08:41    Scheduled Meds: . allopurinol  300 mg Oral BID  .  ceFAZolin (ANCEF) IV  2 g Intravenous Q6H  . cephALEXin  500 mg Oral TID  . citalopram  20 mg Oral q morning - 10a  . docusate sodium  100 mg Oral BID  . [START ON 08/30/2013] enoxaparin (LOVENOX) injection  40 mg  Subcutaneous Q24H  . fentaNYL  100 mcg Transdermal Q72H  . ferrous sulfate  325 mg Oral BID  . gabapentin  300 mg Oral TID  . hydrocortisone  25 mg Rectal Q12H  . [START ON 08/30/2013] hydrocortisone sodium succinate  50 mg Intrathecal Once  . HYDROmorphone      . isosorbide mononitrate  30 mg Oral q morning - 10a  . levothyroxine  50 mcg Oral QAC breakfast  . metoprolol tartrate  12.5 mg Oral BID  . pantoprazole  40 mg Oral Daily  . polyethylene glycol  17 g Oral BID  . potassium chloride SA  20 mEq Oral BID  . [START ON 08/30/2013] predniSONE  20 mg Oral Q breakfast  . thiamine  100 mg Oral Daily  . torsemide  5 mg Oral q morning - 10a  . [START ON 08/30/2013] vancomycin  1,500 mg Intravenous Q24H   Continuous Infusions: . sodium chloride 50 mL/hr at 08/29/13 0608  . lactated ringers    . sodium chloride 0.9 % 1,000 mL with potassium chloride 10 mEq infusion        Time spent: 25 minutes    Brayah Urquilla, [place]  [facility] Pager [phone] If 7PM-7AM, please contact night-coverage at [email or web address], password [facility] 08/29/2013, 3:26 PM  LOS: 0 days

## 2013-08-29 NOTE — Progress Notes (Signed)
pacu nursing: Pt to pacu with hearing aid in right ear, head wrap remains on.

## 2013-08-30 ENCOUNTER — Encounter (HOSPITAL_COMMUNITY): Payer: Self-pay

## 2013-08-30 LAB — CBC
HCT: 26.8 % — ABNORMAL LOW (ref 36.0–46.0)
Hemoglobin: 8.6 g/dL — ABNORMAL LOW (ref 12.0–15.0)
MCH: 30.3 pg (ref 26.0–34.0)
MCHC: 32.1 g/dL (ref 30.0–36.0)
MCV: 94.4 fL (ref 78.0–100.0)
PLATELETS: 107 10*3/uL — AB (ref 150–400)
RBC: 2.84 MIL/uL — ABNORMAL LOW (ref 3.87–5.11)
RDW: 16.1 % — ABNORMAL HIGH (ref 11.5–15.5)
WBC: 6.9 10*3/uL (ref 4.0–10.5)

## 2013-08-30 LAB — BASIC METABOLIC PANEL
BUN: 22 mg/dL (ref 6–23)
CHLORIDE: 104 meq/L (ref 96–112)
CO2: 25 mEq/L (ref 19–32)
Calcium: 7.7 mg/dL — ABNORMAL LOW (ref 8.4–10.5)
Creatinine, Ser: 0.99 mg/dL (ref 0.50–1.10)
GFR calc non Af Amer: 56 mL/min — ABNORMAL LOW (ref >90)
GFR, EST AFRICAN AMERICAN: 65 mL/min — AB (ref >90)
Glucose, Bld: 132 mg/dL — ABNORMAL HIGH (ref 70–99)
POTASSIUM: 4.6 meq/L (ref 3.7–5.3)
SODIUM: 139 meq/L (ref 137–147)

## 2013-08-30 LAB — INFLUENZA PANEL BY PCR (TYPE A & B)
H1N1 flu by pcr: NOT DETECTED
INFLBPCR: NEGATIVE
Influenza A By PCR: NEGATIVE

## 2013-08-30 LAB — VITAMIN D 25 HYDROXY (VIT D DEFICIENCY, FRACTURES): VIT D 25 HYDROXY: 23 ng/mL — AB (ref 30–89)

## 2013-08-30 LAB — PROTIME-INR
INR: 1.34 (ref 0.00–1.49)
PROTHROMBIN TIME: 16.3 s — AB (ref 11.6–15.2)

## 2013-08-30 MED ORDER — HYDROMORPHONE HCL PF 1 MG/ML IJ SOLN: 1.0000 mg | INTRAMUSCULAR | Status: DC | PRN

## 2013-08-30 MED ORDER — BOOST / RESOURCE BREEZE PO LIQD
1.0000 | Freq: Three times a day (TID) | ORAL | Status: DC
  Administered 2013-08-30 – 2013-09-01 (×5): 1 via ORAL

## 2013-08-30 MED ORDER — GABAPENTIN 300 MG PO CAPS
600.0000 mg | ORAL_CAPSULE | Freq: Three times a day (TID) | ORAL | Status: DC
  Administered 2013-08-30 – 2013-09-01 (×6): 600 mg via ORAL
  Filled 2013-08-30 (×8): qty 2

## 2013-08-30 MED ORDER — OXYCODONE-ACETAMINOPHEN 10-325 MG PO TABS: 1.0000 | ORAL_TABLET | Freq: Four times a day (QID) | ORAL | Status: DC | PRN

## 2013-08-30 MED ORDER — METHOCARBAMOL 500 MG PO TABS
500.0000 mg | ORAL_TABLET | Freq: Four times a day (QID) | ORAL | Status: DC | PRN
  Administered 2013-08-30 – 2013-09-01 (×5): 500 mg via ORAL
  Filled 2013-08-30 (×5): qty 1

## 2013-08-30 MED ORDER — WARFARIN SODIUM 6 MG PO TABS
6.0000 mg | ORAL_TABLET | Freq: Once | ORAL | Status: AC
  Administered 2013-08-30: 6 mg via ORAL
  Filled 2013-08-30: qty 1

## 2013-08-30 MED ORDER — ENOXAPARIN SODIUM 40 MG/0.4ML ~~LOC~~ SOLN: 40.0000 mg | SUBCUTANEOUS | Status: DC

## 2013-08-30 NOTE — Progress Notes (Signed)
INITIAL NUTRITION ASSESSMENT  DOCUMENTATION CODES Per approved criteria  -Obesity Unspecified   INTERVENTION: -Resource Breeze po TID, each supplement provides 250 kcal and 9 grams of protein. - Provided education and handouts on a heart healthy diet per pt and pt's family request.  NUTRITION DIAGNOSIS: Inadequate oral intake related to Hodgkin's Lymphoma as evidenced by suspected wt loss and reported intake less than estimated needs.   Goal: Pt to meet >/= 90% of their estimated nutrition needs  Monitor:  Wt, po intake, acceptance of supplements, labs  Reason for Assessment: MST  73 y.o. female  Admitting Dx: Hip fracture  ASSESSMENT: 73 y.o. 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  Pt's and family reported that pt's po intake has been minimal. She says that she suspects she has lost weight, but is unsure of her usual body weight. Pt's family wanted more information on a heart healthy diet. Pt asked for Resource Breeze nutritional supplements because she likes them more than Ensure Complete.   Height: Ht Readings from Last 1 Encounters:  08/29/13 5\' 7"  (1.702 m)    Weight: Wt Readings from Last 1 Encounters:  08/29/13 240 lb 8.4 oz (109.1 kg)    Ideal Body Weight: 61.6 kg  % Ideal Body Weight: 177%  Wt Readings from Last 10 Encounters:  08/29/13 240 lb 8.4 oz (109.1 kg)  08/29/13 240 lb 8.4 oz (109.1 kg)  02/10/13 242 lb (109.77 kg)  02/10/13 242 lb (109.77 kg)  01/25/13 268 lb 8.3 oz (121.8 kg)  01/25/13 268 lb 8.3 oz (121.8 kg)  01/25/13 268 lb 8.3 oz (121.8 kg)  01/25/13 268 lb 8.3 oz (121.8 kg)  08/05/12 279 lb 15.8 oz (127 kg)  07/24/12 291 lb (131.997 kg)    Usual Body Weight:  unknown  % Usual Body Weight: unknown  BMI:  Body mass index is 37.66 kg/(m^2).  Estimated Nutritional Needs: Kcal: 1850-2150 Protein: 115-125 g Fluid: 1.9-2.2 L  Skin:  Stage I pressure ulcer on labia Stage II pressure ulcer on coccyx Incision on right hip  Diet Order: Cardiac  EDUCATION NEEDS: -Education needs addressed   Intake/Output Summary (Last 24 hours) at 08/30/13 1649 Last data filed at 08/30/13 0934  Gross per 24 hour  Intake   1410 ml  Output   1025 ml  Net    385 ml    Last BM: none recorded   Labs:   Recent Labs Lab 08/29/13 0747 08/30/13 0533  NA 137 139  K 4.6 4.6  CL 101 104  CO2 26 25  BUN 20 22  CREATININE 1.01 0.99  CALCIUM 7.8* 7.7*  GLUCOSE 116* 132*    CBG (last 3)  No results found for this basename: GLUCAP,  in the last 72 hours  Scheduled Meds: . allopurinol  300 mg Oral BID  . citalopram  20 mg Oral q morning - 10a  . docusate sodium  100 mg Oral BID  . enoxaparin (LOVENOX) injection  40 mg Subcutaneous Q24H  . fentaNYL  100 mcg Transdermal Q72H  . ferrous sulfate  325 mg Oral BID  . gabapentin  600 mg Oral TID  . hydrocortisone  25 mg Rectal Q12H  . isosorbide mononitrate  30 mg Oral q morning - 10a  .  levothyroxine  50 mcg Oral QAC breakfast  . metoprolol tartrate  12.5 mg Oral BID  . pantoprazole  40 mg Oral Daily  . polyethylene glycol  17 g Oral BID  . potassium chloride SA  20 mEq Oral BID  . thiamine  100 mg Oral Daily  . torsemide  5 mg Oral q morning - 10a  . vancomycin  1,500 mg Intravenous Q24H  . warfarin  6 mg Oral ONCE-1800  . Warfarin - Pharmacist Dosing Inpatient   Does not apply q1800    Continuous Infusions: . sodium chloride 50 mL/hr at 08/29/13 0608  . sodium chloride 0.9 % 1,000 mL with potassium chloride 10 mEq infusion 50 mL/hr at 08/29/13 1721    Past Medical History  Diagnosis Date  . Other chest pain   . Other chronic pain   . Esophageal reflux   . Unspecified essential  hypertension   . Personal history of pulmonary embolism   . Myalgia and myositis, unspecified   . Coronary atherosclerosis of native coronary artery     CATHETERIZATION X 3  . Dysthymic disorder   . Morbid obesity   . Fibromyalgia   . GERD (gastroesophageal reflux disease)   . Anxiety   . Myocardial infarction   . TIA (transient ischemic attack)   . Acute kidney injury   . Complication of anesthesia   . PONV (postoperative nausea and vomiting)     Past Surgical History  Procedure Laterality Date  . Back surgery      X 2 FOR DISK PROBLEMS. three total back surgery  . Abdominal hysterectomy    . Gastric bypass      HISTORY OF  . Carpal tunnel release      bilateral  . Bladder suspension    . Cholecystectomy    . Colonoscopy      Three, h/o polyps. last one ?2 years ago Dr. Britta Mccreedy  . Esophagogastroduodenoscopy      remote  . Colonoscopy, esophagogastroduodenoscopy (egd) and esophageal dilation N/A 01/23/2013    MWU:[id number] hemorrhoids otherwise normal colonoscopy/Mild changes of esophagitis at GE junction(focal erythema).Small gastric pouch with patent gastrojejunostomy.Normal mucosa of proximal small bowel  . Givens capsule study N/A 01/24/2013    Procedure: GIVENS CAPSULE STUDY;  Surgeon: Rogene Houston, MD;  Location: [facility] SUITE;  Service: Endoscopy;  Laterality: N/A;  . Hip arthroplasty Right 08/29/2013    Procedure: HEMI ARTHROPLASTY right hip;  Surgeon: Mauri Pole, MD;  Location: [facility] ORS;  Service: Orthopedics;  Laterality: Right;    Terrace Arabia RD, LDN

## 2013-08-30 NOTE — Progress Notes (Signed)
Clinical Social Work Department BRIEF PSYCHOSOCIAL ASSESSMENT 08/30/2013  Patient:  IDALI, LAFEVER     Account Number:  [id number]     Admit date:  08/29/2013  Clinical Social Worker:  Lacie Scotts  Date/Time:  08/30/2013 02:30 PM  Referred by:  Physician  Date Referred:  08/29/2013 Referred for  SNF Placement   Other Referral:   Interview type:  Patient Other interview type:    PSYCHOSOCIAL DATA Living Status:  HUSBAND Admitted from facility:   Level of care:   Primary support name:  Dell Ponto Primary support relationship to patient:  SPOUSE Degree of support available:   unclear    CURRENT CONCERNS Current Concerns  Post-Acute Placement   Other Concerns:    SOCIAL WORK ASSESSMENT / PLAN Pt is a 73 yr old female hospitalized with a hip fx. Surgery preformed on 1/18. CSW met with pt to assist with d/c planning. ST Rehab will be needed following hospital d/c. Pt has requested [facility]. SNF has been contacted and bed offer made . CSW will continue to follow to assist with d/c planning.   Assessment/plan status:  Psychosocial Support/Ongoing Assessment of Needs Other assessment/ plan:   Information/referral to community resources:   Insurance coverage for SNF reviewed.    PATIENT'S/FAMILY'S RESPONSE TO PLAN OF CARE: Pt is pleased [facility] has an opening for her. " I want to be close to home. "    Werner Lean LCSW [phone]

## 2013-08-30 NOTE — Progress Notes (Signed)
ANTICOAGULATION CONSULT NOTE - Follow Up Consult  Pharmacy Consult for warfarin Indication: history of DVT  Allergies  Allergen Reactions  . Bee Venom Hives  . Codeine Palpitations    Per patient, "Feels like I'm having a heart attack."    Patient Measurements: Height: 5\' 7"  (170.2 cm) Weight: 240 lb 8.4 oz (109.1 kg) IBW/kg (Calculated) : 61.6  Vital Signs: Temp: 98.8 F (37.1 C) (01/19 0952) Temp src: Oral (01/19 0952) BP: 108/41 mmHg (01/19 0952) Pulse Rate: 92 (01/19 0952)  Labs:  Recent Labs  08/29/13 0747 08/30/13 0533  HGB 9.3* 8.6*  HCT 29.6* 26.8*  PLT 127* 107*  APTT 36  --   LABPROT 16.8* 16.3*  INR 1.40 1.34  CREATININE 1.01 0.99    Estimated Creatinine Clearance: 66.3 ml/min (by C-G formula based on Cr of 0.99).   Medical History: Past Medical History  Diagnosis Date  . Other chest pain   . Other chronic pain   . Esophageal reflux   . Unspecified essential hypertension   . Personal history of pulmonary embolism   . Myalgia and myositis, unspecified   . Coronary atherosclerosis of native coronary artery     CATHETERIZATION X 3  . Dysthymic disorder   . Morbid obesity   . Type II or unspecified type diabetes mellitus without mention of complication, not stated as uncontrolled   . Fibromyalgia   . GERD (gastroesophageal reflux disease)   . Anxiety   . Myocardial infarction   . TIA (transient ischemic attack)   . Acute kidney injury   . Complication of anesthesia   . PONV (postoperative nausea and vomiting)     Medications:  Scheduled:  . allopurinol  300 mg Oral BID  . citalopram  20 mg Oral q morning - 10a  . docusate sodium  100 mg Oral BID  . enoxaparin (LOVENOX) injection  40 mg Subcutaneous Q24H  . fentaNYL  100 mcg Transdermal Q72H  . ferrous sulfate  325 mg Oral BID  . gabapentin  300 mg Oral TID  . hydrocortisone  25 mg Rectal Q12H  . isosorbide mononitrate  30 mg Oral q morning - 10a  . levothyroxine  50 mcg Oral QAC  breakfast  . metoprolol tartrate  12.5 mg Oral BID  . pantoprazole  40 mg Oral Daily  . polyethylene glycol  17 g Oral BID  . potassium chloride SA  20 mEq Oral BID  . thiamine  100 mg Oral Daily  . torsemide  5 mg Oral q morning - 10a  . vancomycin  1,500 mg Intravenous Q24H  . Warfarin - Pharmacist Dosing Inpatient   Does not apply q1800   Infusions:  . sodium chloride 50 mL/hr at 08/29/13 0608  . lactated ringers    . sodium chloride 0.9 % 1,000 mL with potassium chloride 10 mEq infusion 50 mL/hr at 08/29/13 1721    Assessment: 73 yo female fell prior to admission and found to have right hip fx.  Underwent R hemi arthroplasty 1/18. Patient was on warfarin 5mg  qpm PTA for hx DVT; also reportedly has IVC filter. Postoperatively warfarin was resumed and Lovenox 40mg  SQ qAM was added until INR response adequate.   1/19: INR 1.34 after warfarin 5 mg yesterday.  Began prophylactic-dose Lovenox 40mg  SQ q24h this AM.  Hgb and Pltc down slightly.  No overt bleeding reported.  Goal of Therapy:  INR 2-3   Plan:  1.  Warfarin 6 mg PO x 1 tonight 2.  Continue Lovenox 40mg  SQ q24h while INR < 2. 3.  Watch H/H, pltc, monitor for signs of bleeding.  Clayburn Pert, PharmD, BCPS Pager: [phone] 08/30/2013  11:01 AM

## 2013-08-30 NOTE — Progress Notes (Signed)
Patient reported seeing black floaters without any other symptoms presence. Bp 107/66, pulse 77.

## 2013-08-30 NOTE — Evaluation (Signed)
Physical Therapy Evaluation Patient Details Name: [name] [name] MRN: [id number] DOB: October 21, 1940 Today's Date: 08/30/2013 Time: 1106-1140 PT Time Calculation (min): 34 min  PT Assessment / Plan / Recommendation History of Present Illness  s/p right hip  hemiarthroplasty  Clinical Impression  Pt will benefit from PT to address deficits below; she will likely need SNF to get back to her baseline  PT Assessment  Patient needs continued PT services    Follow Up Recommendations  SNF    Does the patient have the potential to tolerate intense rehabilitation      Barriers to Discharge        Equipment Recommendations  None recommended by PT    Recommendations for Other Services     Frequency Min 3X/week    Precautions / Restrictions Precautions Precautions: Posterior Hip Restrictions Other Position/Activity Restrictions: WBAT   Pertinent Vitals/Pain Pain 10/10 right hip;   Unable to get OOB due pain;  Pt was premedicated      Mobility  Bed Mobility Overal bed mobility: Needs Assistance;+2 for physical assistance Bed Mobility: Supine to Sit;Sit to Supine Supine to sit: +2 for physical assistance;Total assist Sit to supine: +2 for physical assistance;Total assist General bed mobility comments: cues for  technique     Exercises General Exercises - Lower Extremity Heel Slides: AAROM;Left;10 reps   PT Diagnosis: Generalized weakness  PT Problem List: Decreased strength;Decreased activity tolerance;Decreased mobility PT Treatment Interventions:       PT Goals(Current goals can be found in the care plan section) Acute Rehab PT Goals Patient Stated Goal: to get stronger PT Goal Formulation: With patient Time For Goal Achievement: 09/06/13 Potential to Achieve Goals: Good  Visit Information  Last PT Received On: 08/30/13 Assistance Needed: +2 History of Present Illness: s/p right hip  hemiarthroplasty       Prior [place] expects to  be discharged to:: Private residence Living Arrangements: Spouse/significant other Available Help at Discharge: Available 24 hours/day;Family Type of Home: Mobile home Home Access: Ramped entrance [place]: [facility] bed;Walker - 2 wheels;Wheelchair - Education officer, community - power Prior Passenger transport manager / Transfers Assistance Needed: pt transfers to w/c independently or after set up; functions from w/c level at baseline (for the last 5years or so since back surgery that resulted in spinal injury) ADL's / Homemaking Assistance Needed: family assist with showers and LB dressing, has grab bars and walk in shower  Communication Communication: No difficulties    Cognition  Cognition Arousal/Alertness: Awake/alert Behavior During Therapy: WFL for tasks assessed/performed Overall Cognitive Status: Within Functional Limits for tasks assessed    Extremity/Trunk Assessment Upper Extremity Assessment Upper Extremity Assessment: Generalized weakness Lower Extremity Assessment Lower Extremity Assessment: RLE deficits/detail;LLE deficits/detail RLE Deficits / Details: pt with bil LE weakness at baseline due to back surgery 5 years ago;   grossly   2+/5 left knee and hip; pain limits testing on right RLE Sensation: decreased light touch;decreased proprioception RLE Coordination: decreased gross motor;decreased fine motor LLE Sensation: decreased light touch;decreased proprioception LLE Coordination: decreased gross motor;decreased fine motor   Balance Balance Sitting-balance support: No upper extremity supported;Feet supported Sitting balance-Leahy Scale: Fair Sitting balance - Comments: supervision for safety  End of Session PT - End of Session Equipment Utilized During Treatment: Gait belt Activity Tolerance: Patient limited by pain Patient left: in bed;with call bell/phone within reach Nurse Communication: Mobility status  GP     [facility] 08/30/2013, 1:41  PM

## 2013-08-30 NOTE — Progress Notes (Signed)
Clinical Social Work Department CLINICAL SOCIAL WORK PLACEMENT NOTE 08/30/2013  Patient:  [name] [name], [name] [name]  Account Number:  [id number] Admit date:  08/29/2013  Clinical Social Worker:  Werner Lean, LCSW  Date/time:  08/30/2013 02:36 PM  Clinical Social Work is seeking post-discharge placement for this patient at the following level of care:   SKILLED NURSING   (*CSW will update this form in Epic as items are completed)     Patient/family provided with [place] Department of Clinical Social Work's list of facilities offering this level of care within the geographic area requested by the patient (or if unable, by the patient's family).  08/30/2013  Patient/family informed of their freedom to choose among providers that offer the needed level of care, that participate in Medicare, Medicaid or managed care program needed by the patient, have an available bed and are willing to accept the patient.  08/30/2013  Patient/family informed of [facility]' ownership interest in [facility], as well as of the fact that they are under no obligation to receive care at this facility.  PASARR submitted to [employer] on  PASARR number received from [employer] on 10/25/2008  FL2 transmitted to all facilities in geographic area requested by pt/family on  08/30/2013 FL2 transmitted to all facilities within larger geographic area on   Patient informed that his/her managed care company has contracts with or will negotiate with  certain facilities, including the following:     Patient/family informed of bed offers received:  08/30/2013 Patient chooses bed at [facility] Physician recommends and patient chooses bed at    Patient to be transferred to [place] on   Patient to be transferred to facility by   The following physician request were entered in Epic:   Additional Comments:  Werner Lean LCSW [phone]

## 2013-08-30 NOTE — Progress Notes (Signed)
Patient ID: [name] [name], female   DOB: 1940/11/07, 73 y.o.   MRN: [id number] Subjective: 1 Day Post-Op Procedure(s) (LRB): HEMI ARTHROPLASTY right hip (Right)    Patient reports pain as mild to moderate, pain with movement.  At this point anxious about moving around due to concern for pain  Objective:   VITALS:   Filed Vitals:   08/30/13 0952  BP: 108/41  Pulse: 92  Temp: 98.8 F (37.1 C)  Resp: 16    Neurovascular intact Incision: dressing C/D/I  LABS  Recent Labs  08/29/13 0747 08/30/13 0533  HGB 9.3* 8.6*  HCT 29.6* 26.8*  WBC 8.3 6.9  PLT 127* 107*     Recent Labs  08/29/13 0747 08/30/13 0533  NA 137 139  K 4.6 4.6  BUN 20 22  CREATININE 1.01 0.99  GLUCOSE 116* 132*     Recent Labs  08/29/13 0747 08/30/13 0533  INR 1.40 1.34     Assessment/Plan: 1 Day Post-Op Procedure(s) (LRB): HEMI ARTHROPLASTY right hip (Right)   Advance diet Up with therapy Discharge to SNF when stable  Will plan to maintain foley to better monitor I&Os due to reduced mobility until tomorrow as we anticipate D/C soon plus after therapy mobility may improve

## 2013-08-30 NOTE — Discharge Instructions (Signed)
WBAT right lower extremity Keep incision dry  Maintain surgical dressing as it is water proof and will be removed at first office visit  Any questions please contact our office at [phone]

## 2013-08-30 NOTE — Evaluation (Signed)
Occupational Therapy Evaluation Patient Details Name: [name] [name] MRN: [id number] DOB: 03/22/1941 Today's Date: 08/30/2013 Time: 1417-1446 OT Time Calculation (min): 29 min  OT Assessment / Plan / Recommendation History of present illness s/p right hip  hemiarthroplasty, pt with paraplegia since spinal sx in 2010, used w/c for mobility, but could perform transfers.   Clinical Impression   Pt with pain limiting tolerance of mobility today.  She requires +2 assist for all mobility and is dependent in LB ADL, toileting activities.  She will need to learn an alternative technique for transfers as she was leaning forward using grab bars or momentum to complete transfers and is now limited by posterior hip precautions.  Pt has limited assist of her husband at home.  Recommending post acute rehab.   OT Assessment  All further OT needs can be met in the next venue of care    Follow Up Recommendations  SNF    Barriers to Discharge      Equipment Recommendations       Recommendations for Other Services    Frequency       Precautions / Restrictions Precautions Precautions: Posterior Hip;Fall Precaution Comments: reviewed posterior hip precautions Restrictions Weight Bearing Restrictions: No Other Position/Activity Restrictions: WBAT   Pertinent Vitals/Pain 10/10 R hip, RN notified, repositioned, VSS    ADL  Eating/Feeding: Independent Where Assessed - Eating/Feeding: Bed level Grooming: Wash/dry hands;Wash/dry face;Set up Where Assessed - Grooming: Supine, head of bed up Upper Body Bathing: Minimal assistance Where Assessed - Upper Body Bathing: Unsupported sitting Lower Body Bathing: +2 Total assistance Where Assessed - Lower Body Bathing: Supine, head of bed up;Rolling right and/or left Upper Body Dressing: Minimal assistance Where Assessed - Upper Body Dressing: Supine, head of bed up Lower Body Dressing: +2 Total assistance Where Assessed - Lower Body Dressing: Supine,  head of bed up;Rolling right and/or left Transfers/Ambulation Related to ADLs: pt limited by pain, unable to tolerate OOB    OT Diagnosis: Generalized weakness;Acute pain;Paresis  OT Problem List: Decreased strength;Decreased activity tolerance;Impaired balance (sitting and/or standing);Decreased knowledge of use of DME or AE;Decreased knowledge of precautions;Obesity;Pain;Impaired sensation;Impaired tone OT Treatment Interventions:     OT Goals(Current goals can be found in the care plan section) Acute Rehab OT Goals Patient Stated Goal: to get stronger  Visit Information  Last OT Received On: 08/30/13 Assistance Needed: +2 History of Present Illness: s/p right hip  hemiarthroplasty, pt with paraplegia since spinal sx in 2010, used w/c for mobility, but could perform transfers.       Prior Functioning     Home Living Family/patient expects to be discharged to:: Skilled nursing facility Living Arrangements: Spouse/significant other (husband cannot physically assist) Available Help at Discharge: Available 24 hours/day;Family Type of Home: Mobile home Home Access: Ramped entrance Home Layout: One level Home Equipment: Hospital bed;Walker - 2 wheels;Wheelchair - manual;Wheelchair - power;Shower seat;Grab bars - tub/shower (transfer board with rollers) Prior Function Level of Independence: Needs assistance Gait / Transfers Assistance Needed: pt transfers to w/c independently or after set up; functions from w/c level at baseline (for the last 5years or so since back surgery that resulted in spinal injury) ADL's / Homemaking Assistance Needed: family assist with showers and LB dressing, has grab bars and walk in shower  Comments: pt was using momentum to lean forward and transfer Communication Communication: No difficulties Dominant Hand: Right         Vision/Perception Vision - History Patient Visual Report: No change from baseline     Cognition  Cognition Arousal/Alertness:  Awake/alert Behavior During Therapy: WFL for tasks assessed/performed Overall Cognitive Status: Within Functional Limits for tasks assessed    Extremity/Trunk Assessment Upper Extremity Assessment Upper Extremity Assessment: Overall WFL for tasks assessed Lower Extremity Assessment Lower Extremity Assessment: Defer to PT evaluation RLE Deficits / Details: pt with bil LE weakness at baseline due to back surgery 5 years ago;   grossly   2+/5 left knee and hip; pain limits testing on right RLE Sensation: decreased light touch;decreased proprioception RLE Coordination: decreased gross motor;decreased fine motor LLE Sensation: decreased light touch;decreased proprioception LLE Coordination: decreased gross motor;decreased fine motor     Mobility Bed Mobility Overal bed mobility: Needs Assistance;+2 for physical assistance Bed Mobility: Supine to Sit;Sit to Supine Supine to sit: +2 for physical assistance;Total assist Sit to supine: +2 for physical assistance;Total assist General bed mobility comments: cues for  technique      Exercise    Balance Balance Sitting-balance support: No upper extremity supported;Feet supported Sitting balance-Leahy Scale: Fair Sitting balance - Comments: supervision for safety   End of Session OT - End of Session Activity Tolerance: Patient limited by pain Patient left: in bed;with call bell/phone within reach;with family/visitor present Nurse Communication: Patient requests pain meds  GO     ,  Lynn 08/30/2013, 2:47 PM [phone]  

## 2013-08-30 NOTE — Progress Notes (Signed)
[facility] PROGRESS NOTE  [name] [name] [id number] DOB: Dec 24, 1940 DOA: 08/29/2013 PCP: [name], MD   Brief narrative 73 y.o. 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  Assessment/Plan:  right Hip fracture  S/o right hip hemiarthroplasty on 1/18. Continue pain control with vicodin and prn dilaudid. IV robaxin for muscle spasms  PT /OT eval bowel regimen  Coumadin for DVT prophylaxis   B/l leg cellulitis.  Was admitted to more head 2 weeks back for cellulitis of  the rt leg.  On empiric vancomycin   History of DVT with IVC filter  . Her INR on presentation was subtherapeutic.  resumed Coumadin post surgery.   hypertension and diastolic dysfunction  continue  atenolol. Holding amlodipine as well as Demadex as BP is soft.   History of Hodgkin's lymphoma  Reports stage IV disease.  Patient is on chemotherapy. Follows with Dr Evette Cristal at [facility].   DVT Prophylaxis: subcutaneous Heparin ,coumadin   Nutrition: cardiac   Code Status: full   Family Communication: Family at bedside   Disposition Plan:  needs SNF ( in 1-2 days)  Consultants:  Dr Alvan Dame  Procedures:  Right hip hemiarthorplasty Antibiotics:  none HPI/Subjective:  Patient seen and examined this am.    Objective: Filed Vitals:   08/30/13 0952  BP: 108/41  Pulse: 92  Temp: 98.8 F (37.1 C)  Resp: 16    Intake/Output Summary (Last 24 hours) at 08/30/13 1203 Last data filed at 08/30/13 0934  Gross per 24 hour  Intake   2310 ml  Output   1600 ml  Net    710 ml   Filed Weights   08/29/13 0500  Weight: 109.1 kg (240 lb 8.4 oz)     Exam: General: Elderly female in NAD  HEENT: no pallor, moist mucosa  Chest: clear b/l, no added sounds  CVS: [id number] , 2/6 systolic murmur  ABD: soft, NT, ND, BS+, foley in place  Ext: rt hip dressing  CNS: AAOX3, paraplegic   Data Reviewed: Basic Metabolic Panel:  Recent Labs Lab 08/29/13 0747 08/30/13 0533  NA 137 139  K 4.6 4.6  CL 101 104  CO2 26 25  GLUCOSE 116* 132*  BUN 20 22  CREATININE 1.01 0.99  CALCIUM 7.8* 7.7*   Liver Function Tests:  Recent Labs Lab 08/29/13 0747  AST 26  ALT 17  ALKPHOS 137*  BILITOT 0.5  PROT 5.2*  ALBUMIN 2.4*   No results found for this basename: LIPASE, AMYLASE,  in the last 168 hours No results found for this basename: AMMONIA,  in the last 168 hours CBC:  Recent Labs Lab 08/29/13 0747 08/30/13 0533  WBC 8.3 6.9  HGB 9.3* 8.6*  HCT 29.6* 26.8*  MCV 94.3 94.4  PLT 127* 107*   Cardiac Enzymes: No results found for this basename: CKTOTAL, CKMB, CKMBINDEX, TROPONINI,  in the last 168 hours BNP (last 3 results) No results found for this basename: PROBNP,  in the last 8760 hours CBG: No results found for this basename: GLUCAP,  in the last 168 hours  Recent Results (from the past 240 hour(s))  CULTURE, BLOOD (ROUTINE X 2)  Status: None   Collection Time    08/29/13  7:35 AM      Result Value Range Status   Specimen Description BLOOD LEFT HAND   Final   Special Requests BOTTLES DRAWN AEROBIC ONLY 1CC   Final   Culture  Setup Time     Final   Value: 08/29/2013 17:49     Performed at [facility]   Culture     Final   Value:        BLOOD CULTURE RECEIVED NO GROWTH TO DATE CULTURE WILL BE HELD FOR 5 DAYS BEFORE ISSUING A FINAL NEGATIVE REPORT     Performed at [facility]   Report Status PENDING   Incomplete  CULTURE, BLOOD (ROUTINE X 2)     Status: None   Collection Time    08/29/13  7:47 AM      Result Value Range Status   Specimen Description BLOOD LEFT ARM   Final   Special Requests  BOTTLES DRAWN AEROBIC AND ANAEROBIC 1.5CC EACH   Final   Culture  Setup Time     Final   Value: 08/29/2013 23:17     Performed at [facility]   Culture     Final   Value:        BLOOD CULTURE RECEIVED NO GROWTH TO DATE CULTURE WILL BE HELD FOR 5 DAYS BEFORE ISSUING A FINAL NEGATIVE REPORT     Performed at [facility]   Report Status PENDING   Incomplete  SURGICAL PCR SCREEN     Status: None   Collection Time    08/29/13 10:11 AM      Result Value Range Status   MRSA, PCR NEGATIVE  NEGATIVE Final   Staphylococcus aureus NEGATIVE  NEGATIVE Final   Comment:            The Xpert SA Assay (FDA     approved for NASAL specimens     in patients over 73 years of age),     is one component of     a comprehensive surveillance     program.  Test performance has     been validated by [employer] for patients greater     than or equal to 16 year old.     It is not intended     to diagnose infection nor to     guide or monitor treatment.     Studies: US Abdomen Complete  08/29/2013   CLINICAL DATA:  Diffuse left-sided abdominal discomfort, history of cholecystectomy and hysterectomy. Also history of diabetes in lymphoma  EXAM: ULTRASOUND ABDOMEN COMPLETE  COMPARISON:  None.  FINDINGS: Gallbladder:  The gallbladder is surgically absent. There are no abnormal findings in the gallbladder fossa.  Common bile duct:  Diameter: 8.9 mm which is not unusual in the post cholecystectomy state.  Liver:  The echotexture of the liver is mildly increased. There is no focal mass. No intrahepatic ductal dilation is demonstrated. Portal venous flow remains normal in direction toward the liver.  IVC:  No abnormality visualized.  Pancreas:  Visualized portion unremarkable.  Spleen:  The spleen exhibits normal echotexture and measures 8.4 cm in length.  Right Kidney:  Length: 10.4 cm. There is no hydronephrosis. Subjective mild thinning of the cortex is present. There is no focal mass  Left Kidney:   Length: 10.7 cm. There is no evidence of hydronephrosis or focal mass. The cortical echotexture appears normal but there  is mild cortical thinning.  Abdominal aorta:  No aneurysm visualized.  Other findings:  No ascites is demonstrated.  IMPRESSION: 1. The gallbladder is surgically absent. No acute abnormality of the liver or pancreas or spleen is demonstrated. 2. The kidneys exhibit no acute abnormalities. There is mild cortical atrophy diffusely. 3. No ascites is demonstrated.   Electronically Signed   By: David  Martinique   On: 08/29/2013 12:04   Dg Pelvis Portable  08/29/2013   CLINICAL DATA:  Postop hip surgery.  EXAM: PORTABLE PELVIS 1-2 VIEWS  COMPARISON:  08/28/2013  FINDINGS: A right hip arthroplasty has been performed. The hip arthroplasty appears to be grossly located on these frontal images. Pelvic bony ring is intact. Postsurgical changes at the lumbosacral junction. No gross abnormality to the left hip.  IMPRESSION: Right hip arthroplasty without complicating features.   Electronically Signed   By: Markus Daft M.D.   On: 08/29/2013 13:56   Dg Chest Portable 1 View  08/29/2013   CLINICAL DATA:  73 year old hypoxia.  EXAM: PORTABLE CHEST - 1 VIEW  COMPARISON:  07/16/2013, 06/28/2013 and chest CT 05/10/2013  FINDINGS: Port-A-Cath tip in the SVC region. Again noted are patchy densities in the right upper lung. These densities have not significantly changed since 06/28/2013 and may be related to known lung lesions. Again noted are coarse lung markings and cannot exclude small lung lesions based on previous CT findings. Heart size is within normal limits.  IMPRESSION: Patchy lung densities, particularly in the right upper lung. Findings could be related to known lung lesions. Difficult to exclude acute on chronic disease. Lungs may be better characterized with a chest CT.   Electronically Signed   By: Markus Daft M.D.   On: 08/29/2013 08:41    Scheduled Meds: . allopurinol  300 mg Oral BID  .  citalopram  20 mg Oral q morning - 10a  . docusate sodium  100 mg Oral BID  . enoxaparin (LOVENOX) injection  40 mg Subcutaneous Q24H  . fentaNYL  100 mcg Transdermal Q72H  . ferrous sulfate  325 mg Oral BID  . gabapentin  300 mg Oral TID  . hydrocortisone  25 mg Rectal Q12H  . isosorbide mononitrate  30 mg Oral q morning - 10a  . levothyroxine  50 mcg Oral QAC breakfast  . metoprolol tartrate  12.5 mg Oral BID  . pantoprazole  40 mg Oral Daily  . polyethylene glycol  17 g Oral BID  . potassium chloride SA  20 mEq Oral BID  . thiamine  100 mg Oral Daily  . torsemide  5 mg Oral q morning - 10a  . vancomycin  1,500 mg Intravenous Q24H  . warfarin  6 mg Oral ONCE-1800  . Warfarin - Pharmacist Dosing Inpatient   Does not apply q1800   Continuous Infusions: . sodium chloride 50 mL/hr at 08/29/13 0608  . sodium chloride 0.9 % 1,000 mL with potassium chloride 10 mEq infusion 50 mL/hr at 08/29/13 1721      Time spent: 25 minutes    Cassy Sprowl, [place]  [facility] Pager [phone]. If 7PM-7AM, please contact night-coverage at [email or web address], password [facility] 08/30/2013, 12:03 PM  LOS: 1 day

## 2013-08-30 NOTE — Care Management Note (Addendum)
    Page 1 of 1   09/01/2013     4:50:14 PM   CARE MANAGEMENT NOTE 09/01/2013  Patient:  BRIGITT, MCCLISH   Account Number:  [id number]  Date Initiated:  08/30/2013  Documentation initiated by:  Sherrin Daisy  Subjective/Objective Assessment:   dx rt femeoral neck fracture; rt hip hemiarthroplasty  hx hodgkin's lymphoma-on chemo     Action/Plan:   Anticipate SNF; CM will follow as needed   Anticipated DC Date:  09/01/2013   Anticipated DC Plan:  SKILLED NURSING FACILITY  In-house referral  Clinical Social Worker      DC Planning Services  CM consult      Choice offered to / List presented to:             Status of service:  Completed, signed off Medicare Important Message given?  NA - LOS <3 / Initial given by admissions (If response is "NO", the following Medicare IM given date fields will be blank) Date Medicare IM given:   Date Additional Medicare IM given:    Discharge Disposition:  [place]  Per UR Regulation:  Reviewed for med. necessity/level of care/duration of stay  If discussed at [place] of Stay Meetings, dates discussed:    Comments:

## 2013-08-31 LAB — BASIC METABOLIC PANEL
BUN: 21 mg/dL (ref 6–23)
CALCIUM: 8.1 mg/dL — AB (ref 8.4–10.5)
CO2: 26 mEq/L (ref 19–32)
Chloride: 105 mEq/L (ref 96–112)
Creatinine, Ser: 0.95 mg/dL (ref 0.50–1.10)
GFR calc Af Amer: 68 mL/min — ABNORMAL LOW (ref >90)
GFR calc non Af Amer: 59 mL/min — ABNORMAL LOW (ref >90)
GLUCOSE: 101 mg/dL — AB (ref 70–99)
Potassium: 5.1 mEq/L (ref 3.7–5.3)
SODIUM: 140 meq/L (ref 137–147)

## 2013-08-31 LAB — CBC
HCT: 26.5 % — ABNORMAL LOW (ref 36.0–46.0)
Hemoglobin: 8.3 g/dL — ABNORMAL LOW (ref 12.0–15.0)
MCH: 29.7 pg (ref 26.0–34.0)
MCHC: 31.3 g/dL (ref 30.0–36.0)
MCV: 95 fL (ref 78.0–100.0)
PLATELETS: 120 10*3/uL — AB (ref 150–400)
RBC: 2.79 MIL/uL — ABNORMAL LOW (ref 3.87–5.11)
RDW: 15.8 % — AB (ref 11.5–15.5)
WBC: 4.8 10*3/uL (ref 4.0–10.5)

## 2013-08-31 LAB — PROTIME-INR
INR: 1.66 — ABNORMAL HIGH (ref 0.00–1.49)
PROTHROMBIN TIME: 19.1 s — AB (ref 11.6–15.2)

## 2013-08-31 MED ORDER — WARFARIN SODIUM 5 MG PO TABS
5.0000 mg | ORAL_TABLET | Freq: Once | ORAL | Status: AC
  Administered 2013-08-31: 5 mg via ORAL
  Filled 2013-08-31: qty 1

## 2013-08-31 NOTE — Progress Notes (Signed)
CSW assisting with d/c planning. CSW has confirmed that [facility] has a ST Rehab bed for pt on WED. CSW will continue to follow to assist with d/c planning.  Werner Lean LCSW [phone]

## 2013-08-31 NOTE — Progress Notes (Signed)
ANTICOAGULATION CONSULT NOTE - Follow Up Consult  Pharmacy Consult for warfarin Indication: history of DVT  Allergies  Allergen Reactions  . Bee Venom Hives  . Codeine Palpitations    Per patient, "Feels like I'm having a heart attack."    Patient Measurements: Height: 5\' 7"  (170.2 cm) Weight: 240 lb 8.4 oz (109.1 kg) IBW/kg (Calculated) : 61.6  Vital Signs: Temp: 99 F (37.2 C) (01/20 0600) Temp src: Oral (01/20 0600) BP: 114/69 mmHg (01/20 0600) Pulse Rate: 82 (01/20 0600)  Labs:  Recent Labs  08/29/13 0747 08/30/13 0533 08/31/13 0535  HGB 9.3* 8.6* 8.3*  HCT 29.6* 26.8* 26.5*  PLT 127* 107* 120*  APTT 36  --   --   LABPROT 16.8* 16.3* 19.1*  INR 1.40 1.34 1.66*  CREATININE 1.01 0.99 0.95    Estimated Creatinine Clearance: 69.1 ml/min (by C-G formula based on Cr of 0.95).   Medical History: Past Medical History  Diagnosis Date  . Other chest pain   . Other chronic pain   . Esophageal reflux   . Unspecified essential hypertension   . Personal history of pulmonary embolism   . Myalgia and myositis, unspecified   . Coronary atherosclerosis of native coronary artery     CATHETERIZATION X 3  . Dysthymic disorder   . Morbid obesity   . Fibromyalgia   . GERD (gastroesophageal reflux disease)   . Anxiety   . Myocardial infarction   . TIA (transient ischemic attack)   . Acute kidney injury   . Complication of anesthesia   . PONV (postoperative nausea and vomiting)     Medications:  Scheduled:  . allopurinol  300 mg Oral BID  . citalopram  20 mg Oral q morning - 10a  . docusate sodium  100 mg Oral BID  . enoxaparin (LOVENOX) injection  40 mg Subcutaneous Q24H  . feeding supplement (RESOURCE BREEZE)  1 Container Oral TID BM  . fentaNYL  100 mcg Transdermal Q72H  . ferrous sulfate  325 mg Oral BID  . gabapentin  600 mg Oral TID  . hydrocortisone  25 mg Rectal Q12H  . isosorbide mononitrate  30 mg Oral q morning - 10a  . levothyroxine  50 mcg Oral  QAC breakfast  . metoprolol tartrate  12.5 mg Oral BID  . pantoprazole  40 mg Oral Daily  . polyethylene glycol  17 g Oral BID  . potassium chloride SA  20 mEq Oral BID  . thiamine  100 mg Oral Daily  . torsemide  5 mg Oral q morning - 10a  . vancomycin  1,500 mg Intravenous Q24H  . Warfarin - Pharmacist Dosing Inpatient   Does not apply q1800   Infusions:  . sodium chloride 50 mL/hr at 08/29/13 0608  . sodium chloride 0.9 % 1,000 mL with potassium chloride 10 mEq infusion 20 mL/hr at 08/30/13 1540  Inpatient warfarin doses administered 1/18 - 1/19:  5mg , 6mg   Assessment: 73 yo female fell prior to admission and found to have right hip fx.  Underwent R hemi arthroplasty 1/18. Patient was on warfarin 5mg  qpm PTA for hx DVT; also reportedly has IVC filter. Postoperatively warfarin was resumed and Lovenox 40mg  SQ qAM was added until INR response adequate.   1/20: INR responding (1.66) after resuming warfarin on 1/18.  Hgb drifting down but remains >8. Pltc improving. No overt bleeding reported in notes.  Goal of Therapy:  INR 2-3   Plan:  1.  Warfarin 5 mg PO x  1 tonight 2.  Continue Lovenox 40mg  SQ q24h while INR < 2. 3.  Watch H/H, pltc, monitor for signs of bleeding.  Clayburn Pert, PharmD, BCPS Pager: [phone] 08/31/2013  8:32 AM

## 2013-08-31 NOTE — Progress Notes (Signed)
[facility] PROGRESS NOTE  DANECIA UNDERDOWN FIE:[id number] DOB: Nov 24, 1940 DOA: 08/29/2013 PCP: [name], MD  Brief narrative  73 y.o. 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   Assessment/Plan:  right Hip fracture  S/o right hip hemiarthroplasty on 1/18. Continue pain control with vicodin and prn dilaudid. IV robaxin for muscle spasms  PT /OT eval  bowel regimen  Coumadin for DVT prophylaxis   B/l leg cellulitis.  Was admitted to more head 2 weeks back for cellulitis of the rt leg.  On empiric vancomycin .  switch to po doxycycline or bactrim upon discharge.  History of DVT with IVC filter   INR on presentation was subtherapeutic.  resumed Coumadin post surgery.   hypertension and diastolic dysfunction  continue atenolol. Holding amlodipine as well as Demadex as BP is soft.   History of Hodgkin's lymphoma  Reports stage IV disease.  Patient is on chemotherapy. Follows with Dr Evette Cristal at [facility].   DVT Prophylaxis: subcutaneous Heparin ,coumadin   Nutrition: cardiac   Code Status: full  Family Communication: none at bedside   Disposition Plan: needs SNF possibly tomorrow   Consultants:  Dr Alvan Dame    Procedures:  Right hip hemiarthorplasty Antibiotics:  None  HPI/Subjective:  Patient seen and examined this am. Hip Pain better controlled   Objective: Filed Vitals:   08/31/13 1200  BP:   Pulse:   Temp:   Resp: 16    Intake/Output Summary (Last 24 hours) at 08/31/13 1231 Last data filed at 08/31/13 1222  Gross per 24 hour  Intake 1937.5 ml  Output   2250 ml  Net -312.5 ml   Filed Weights    08/29/13 0500  Weight: 109.1 kg (240 lb 8.4 oz)    Exam:  General: Elderly female in NAD  HEENT: no pallor, moist mucosa  Chest: clear b/l, no added sounds  CVS: ZY6&A6 , 2/6 systolic murmur  ABD: soft, NT, ND, BS+, foley in place  Ext: rt hip dressing , minimal erythema over b/l  CNS: AAOX3, paraplegic   Data Reviewed: Basic Metabolic Panel:  Recent Labs Lab 08/29/13 0747 08/30/13 0533 08/31/13 0535  NA 137 139 140  K 4.6 4.6 5.1  CL 101 104 105  CO2 26 25 26   GLUCOSE 116* 132* 101*  BUN 20 22 21   CREATININE 1.01 0.99 0.95  CALCIUM 7.8* 7.7* 8.1*   Liver Function Tests:  Recent Labs Lab 08/29/13 0747  AST 26  ALT 17  ALKPHOS 137*  BILITOT 0.5  PROT 5.2*  ALBUMIN 2.4*   No results found for this basename: LIPASE, AMYLASE,  in the last 168 hours No results found for this basename: AMMONIA,  in the last 168 hours CBC:  Recent Labs Lab 08/29/13 0747 08/30/13 0533 08/31/13 0535  WBC 8.3 6.9 4.8  HGB 9.3* 8.6* 8.3*  HCT 29.6* 26.8* 26.5*  MCV 94.3 94.4 95.0  PLT 127* 107* 120*   Cardiac Enzymes: No results found for this basename: CKTOTAL, CKMB, CKMBINDEX, TROPONINI,  in the last 168 hours BNP (last 3 results) No results found for this basename: PROBNP,  in the last 8760 hours CBG: No results found for this basename:  GLUCAP,  in the last 168 hours  Recent Results (from the past 240 hour(s))  CULTURE, BLOOD (ROUTINE X 2)     Status: None   Collection Time    08/29/13  7:35 AM      Result Value Range Status   Specimen Description BLOOD LEFT HAND   Final   Special Requests BOTTLES DRAWN AEROBIC ONLY 1CC   Final   Culture  Setup Time     Final   Value: 08/29/2013 17:49     Performed at [facility]   Culture     Final   Value:        BLOOD CULTURE RECEIVED NO GROWTH TO DATE CULTURE WILL BE HELD FOR 5 DAYS BEFORE ISSUING A FINAL NEGATIVE REPORT     Performed at [facility]   Report Status PENDING   Incomplete  CULTURE, BLOOD  (ROUTINE X 2)     Status: None   Collection Time    08/29/13  7:47 AM      Result Value Range Status   Specimen Description BLOOD LEFT ARM   Final   Special Requests BOTTLES DRAWN AEROBIC AND ANAEROBIC 1.5CC EACH   Final   Culture  Setup Time     Final   Value: 08/29/2013 23:17     Performed at [facility]   Culture     Final   Value:        BLOOD CULTURE RECEIVED NO GROWTH TO DATE CULTURE WILL BE HELD FOR 5 DAYS BEFORE ISSUING A FINAL NEGATIVE REPORT     Performed at [facility]   Report Status PENDING   Incomplete  SURGICAL PCR SCREEN     Status: None   Collection Time    08/29/13 10:11 AM      Result Value Range Status   MRSA, PCR NEGATIVE  NEGATIVE Final   Staphylococcus aureus NEGATIVE  NEGATIVE Final   Comment:            The Xpert SA Assay (FDA     approved for NASAL specimens     in patients over 64 years of age),     is one component of     a comprehensive surveillance     program.  Test performance has     been validated by [employer] for patients greater     than or equal to 6 year old.     It is not intended     to diagnose infection nor to     guide or monitor treatment.     Studies: Dg Pelvis Portable  08/29/2013   CLINICAL DATA:  Postop hip surgery.  EXAM: PORTABLE PELVIS 1-2 VIEWS  COMPARISON:  08/28/2013  FINDINGS: A right hip arthroplasty has been performed. The hip arthroplasty appears to be grossly located on these frontal images. Pelvic bony ring is intact. Postsurgical changes at the lumbosacral junction. No gross abnormality to the left hip.  IMPRESSION: Right hip arthroplasty without complicating features.   Electronically Signed   By: Markus Daft M.D.   On: 08/29/2013 13:56    Scheduled Meds: . allopurinol  300 mg Oral BID  . citalopram  20 mg Oral q morning - 10a  . docusate sodium  100 mg Oral BID  . enoxaparin (LOVENOX) injection  40 mg Subcutaneous Q24H  . feeding supplement (RESOURCE BREEZE)  1 Container Oral TID BM  .  fentaNYL  100 mcg Transdermal Q72H  .  ferrous sulfate  325 mg Oral BID  . gabapentin  600 mg Oral TID  . hydrocortisone  25 mg Rectal Q12H  . isosorbide mononitrate  30 mg Oral q morning - 10a  . levothyroxine  50 mcg Oral QAC breakfast  . metoprolol tartrate  12.5 mg Oral BID  . pantoprazole  40 mg Oral Daily  . polyethylene glycol  17 g Oral BID  . potassium chloride SA  20 mEq Oral BID  . thiamine  100 mg Oral Daily  . torsemide  5 mg Oral q morning - 10a  . vancomycin  1,500 mg Intravenous Q24H  . warfarin  5 mg Oral ONCE-1800  . Warfarin - Pharmacist Dosing Inpatient   Does not apply q1800   Continuous Infusions: . sodium chloride 50 mL/hr at 08/31/13 1223  . sodium chloride 0.9 % 1,000 mL with potassium chloride 10 mEq infusion 20 mL/hr at 08/30/13 1835      Time spent: 25 minutes    Klark Vanderhoef, [place]  [facility] Pager [phone]. If 7PM-7AM, please contact night-coverage at [email or web address], password [facility] 08/31/2013, 12:31 PM  LOS: 2 days

## 2013-09-01 DIAGNOSIS — IMO0001 Reserved for inherently not codable concepts without codable children: Secondary | ICD-10-CM

## 2013-09-01 DIAGNOSIS — D62 Acute posthemorrhagic anemia: Secondary | ICD-10-CM

## 2013-09-01 DIAGNOSIS — M549 Dorsalgia, unspecified: Secondary | ICD-10-CM

## 2013-09-01 DIAGNOSIS — J9600 Acute respiratory failure, unspecified whether with hypoxia or hypercapnia: Secondary | ICD-10-CM

## 2013-09-01 LAB — PROTIME-INR
INR: 1.79 — ABNORMAL HIGH (ref 0.00–1.49)
Prothrombin Time: 20.3 seconds — ABNORMAL HIGH (ref 11.6–15.2)

## 2013-09-01 LAB — VANCOMYCIN, TROUGH: Vancomycin Tr: 15.9 ug/mL (ref 10.0–20.0)

## 2013-09-01 MED ORDER — ENOXAPARIN SODIUM 40 MG/0.4ML ~~LOC~~ SOLN: 40.0000 mg | SUBCUTANEOUS | Status: AC

## 2013-09-01 MED ORDER — HEPARIN SOD (PORK) LOCK FLUSH 100 UNIT/ML IV SOLN
500.0000 [IU] | INTRAVENOUS | Status: AC | PRN
  Administered 2013-09-01: 500 [IU]

## 2013-09-01 MED ORDER — SULFAMETHOXAZOLE-TMP DS 800-160 MG PO TABS
1.0000 | ORAL_TABLET | Freq: Two times a day (BID) | ORAL | Status: DC
Start: 2013-09-01 — End: 2013-09-01

## 2013-09-01 MED ORDER — OXYCODONE-ACETAMINOPHEN 10-325 MG PO TABS: 1.0000 | ORAL_TABLET | Freq: Four times a day (QID) | ORAL | Status: DC | PRN

## 2013-09-01 MED ORDER — DOXYCYCLINE HYCLATE 100 MG PO CAPS: 100.0000 mg | ORAL_CAPSULE | Freq: Two times a day (BID) | ORAL | Status: DC

## 2013-09-01 NOTE — Progress Notes (Signed)
   Subjective: 3 Days Post-Op Procedure(s) (LRB): HEMI ARTHROPLASTY right hip (Right)   Patient reports pain as mild.  No events throughout the night.  Objective:   VITALS:   Filed Vitals:   09/01/13 0540  BP: 114/68  Pulse: 83  Temp: 98.7 F (37.1 C)  Resp: 16    Incision: dressing C/D/I No cellulitis present Compartment soft  LABS  Recent Labs  08/30/13 0533 08/31/13 0535  HGB 8.6* 8.3*  HCT 26.8* 26.5*  WBC 6.9 4.8  PLT 107* 120*     Recent Labs  08/30/13 0533 08/31/13 0535  NA 139 140  K 4.6 5.1  BUN 22 21  CREATININE 0.99 0.95  GLUCOSE 132* 101*     Assessment/Plan: 3 Days Post-Op Procedure(s) (LRB): HEMI ARTHROPLASTY right hip (Right) Up with therapy Discharge to SNF eventually, when ready Orthopaedically stable Lovenox for anticoagulation, Rx written Percocet for pain, Rx written Follow up in 2 weeks at [facility]. Follow up with OLIN,Nariyah Osias D in 2 weeks.  Contact information:  [facility] [address], [address] [place]   PAC  09/01/2013, 8:36 AM

## 2013-09-01 NOTE — Progress Notes (Deleted)
ANTIBIOTIC CONSULT NOTE - Follow up [facility] for vancomycin Indication: cellulitis  Allergies  Allergen Reactions  . Bee Venom Hives  . Codeine Palpitations    Per patient, "Feels like I'm having a heart attack."    Patient Measurements: Height: 5\' 7"  (170.2 cm) Weight: 240 lb 8.4 oz (109.1 kg) IBW/kg (Calculated) : 61.6  Vital Signs: Temp: 98.7 F (37.1 C) (01/21 0540) Temp src: Oral (01/21 0540) BP: 114/68 mmHg (01/21 0540) Pulse Rate: 83 (01/21 0540) Intake/Output from previous day: 01/20 0701 - 01/21 0700 In: 2094.2 [P.O.:1200; I.V.:894.2] Out: 3301 [Urine:3300; Stool:1]    Labs:  Recent Labs  08/30/13 0533 08/31/13 0535  WBC 6.9 4.8  HGB 8.6* 8.3*  PLT 107* 120*  CREATININE 0.99 0.95   Estimated Creatinine Clearance: 69.1 ml/min (by C-G formula based on Cr of 0.95).  Recent Labs  09/01/13 0458  VANCOTROUGH 15.9     Microbiology: Recent Results (from the past 720 hour(s))  CULTURE, BLOOD (ROUTINE X 2)     Status: None   Collection Time    08/29/13  7:35 AM      Result Value Range Status   Specimen Description BLOOD LEFT HAND   Final   Special Requests BOTTLES DRAWN AEROBIC ONLY 1CC   Final   Culture  Setup Time     Final   Value: 08/29/2013 17:49     Performed at [facility]   Culture     Final   Value:        BLOOD CULTURE RECEIVED NO GROWTH TO DATE CULTURE WILL BE HELD FOR 5 DAYS BEFORE ISSUING A FINAL NEGATIVE REPORT     Performed at [facility]   Report Status PENDING   Incomplete  CULTURE, BLOOD (ROUTINE X 2)     Status: None   Collection Time    08/29/13  7:47 AM      Result Value Range Status   Specimen Description BLOOD LEFT ARM   Final   Special Requests BOTTLES DRAWN AEROBIC AND ANAEROBIC 1.5CC EACH   Final   Culture  Setup Time     Final   Value: 08/29/2013 23:17     Performed at [facility]   Culture     Final   Value:        BLOOD CULTURE RECEIVED NO GROWTH TO DATE CULTURE WILL BE  HELD FOR 5 DAYS BEFORE ISSUING A FINAL NEGATIVE REPORT     Performed at [facility]   Report Status PENDING   Incomplete  SURGICAL PCR SCREEN     Status: None   Collection Time    08/29/13 10:11 AM      Result Value Range Status   MRSA, PCR NEGATIVE  NEGATIVE Final   Staphylococcus aureus NEGATIVE  NEGATIVE Final   Comment:            The Xpert SA Assay (FDA     approved for NASAL specimens     in patients over 70 years of age),     is one component of     a comprehensive surveillance     program.  Test performance has     been validated by [employer] for patients greater     than or equal to 73 year old.     It is not intended     to diagnose infection nor to     guide or monitor treatment.  Medical History: Past Medical History  Diagnosis Date  . Other chest pain   . Other chronic pain   . Esophageal reflux   . Unspecified essential hypertension   . Personal history of pulmonary embolism   . Myalgia and myositis, unspecified   . Coronary atherosclerosis of native coronary artery     CATHETERIZATION X 3  . Dysthymic disorder   . Morbid obesity   . Fibromyalgia   . GERD (gastroesophageal reflux disease)   . Anxiety   . Myocardial infarction   . TIA (transient ischemic attack)   . Acute kidney injury   . Complication of anesthesia   . PONV (postoperative nausea and vomiting)     Medications:  Scheduled:  . allopurinol  300 mg Oral BID  . citalopram  20 mg Oral q morning - 10a  . docusate sodium  100 mg Oral BID  . enoxaparin (LOVENOX) injection  40 mg Subcutaneous Q24H  . feeding supplement (RESOURCE BREEZE)  1 Container Oral TID BM  . fentaNYL  100 mcg Transdermal Q72H  . ferrous sulfate  325 mg Oral BID  . gabapentin  600 mg Oral TID  . hydrocortisone  25 mg Rectal Q12H  . isosorbide mononitrate  30 mg Oral q morning - 10a  . levothyroxine  50 mcg Oral QAC breakfast  . metoprolol tartrate  12.5 mg Oral BID  . pantoprazole  40 mg Oral Daily   . polyethylene glycol  17 g Oral BID  . potassium chloride SA  20 mEq Oral BID  . thiamine  100 mg Oral Daily  . vancomycin  1,500 mg Intravenous Q24H  . Warfarin - Pharmacist Dosing Inpatient   Does not apply q1800   Infusions:  . 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 Pharmacy consulted for treatment of bilateral cellulitis with vancomycin.   Vancomycin 1/19>>  1/19: Initiated Vancomycin 1500 mg q24hr  1/21: Vancomycin trough: 15.9  Patient is currently afebrile with WBC WNL. SCr has trended down 1.01>>0.95, estimated CrCl 69 mL/min, UOP good (1.3 mL/kg/hr yesterday).   Though vancomycin trough is slightly above goal range, renal function is improving, and patient is likely to remain within therapeutic range on current regimen.   Goal of Therapy:  Vancomycin trough level 10-15 mcg/ml  Plan:  1) Continue Vancomycin 1500 mg IV q24hr   2) Monitor renal function, cultures, and clinical status  Silas Sacramento, PharmD Candidate   09/01/2013 8:11 AM

## 2013-09-01 NOTE — Progress Notes (Signed)
Clinical Social Work Department CLINICAL SOCIAL WORK PLACEMENT NOTE 09/01/2013  Patient:  GISSELL, BARRA  Account Number:  [id number] Admit date:  08/29/2013  Clinical Social Worker:  Werner Lean, LCSW  Date/time:  08/30/2013 02:36 PM  Clinical Social Work is seeking post-discharge placement for this patient at the following level of care:   SKILLED NURSING   (*CSW will update this form in Epic as items are completed)     Patient/family provided with [place] Department of Clinical Social Work's list of facilities offering this level of care within the geographic area requested by the patient (or if unable, by the patient's family).  08/30/2013  Patient/family informed of their freedom to choose among providers that offer the needed level of care, that participate in Medicare, Medicaid or managed care program needed by the patient, have an available bed and are willing to accept the patient.  08/30/2013  Patient/family informed of [facility]' ownership interest in [facility], as well as of the fact that they are under no obligation to receive care at this facility.  PASARR submitted to EDS on  PASARR number received from EDS on 10/25/2008  FL2 transmitted to all facilities in geographic area requested by pt/family on  08/30/2013 FL2 transmitted to all facilities within larger geographic area on   Patient informed that his/her managed care company has contracts with or will negotiate with  certain facilities, including the following:     Patient/family informed of bed offers received:  08/30/2013 Patient chooses bed at [facility] Physician recommends and patient chooses bed at    Patient to be transferred to [facility] on  09/01/2013 Patient to be transferred to facility by [facility]  The following physician request were entered in Epic:   Additional Comments:  Werner Lean LCSW [phone]

## 2013-09-01 NOTE — Progress Notes (Signed)
Agree with above.   Hgb has been trending down, but no new CBC today.  No bleeding per RN. Continue pt's home dose warfarin with INR f/u tomorrow or Friday.     Ralene Bathe, PharmD, BCPS 09/01/2013, 10:38 AM  Pager: [phone]

## 2013-09-01 NOTE — Progress Notes (Signed)
RN called report to Noel Christmas at [facility]. All questions answered.    Patient transported via [place].

## 2013-09-01 NOTE — Progress Notes (Signed)
ANTICOAGULATION CONSULT NOTE - Follow Up Consult  Pharmacy Consult for warfarin Indication: history of DVT   Allergies  Allergen Reactions  . Bee Venom Hives  . Codeine Palpitations    Per patient, "Feels like I'm having a heart attack."    Patient Measurements: Height: 5\' 7"  (170.2 cm) Weight: 240 lb 8.4 oz (109.1 kg) IBW/kg (Calculated) : 61.6   Vital Signs: Temp: 98.7 F (37.1 C) (01/21 0540) Temp src: Oral (01/21 0540) BP: 114/68 mmHg (01/21 0540) Pulse Rate: 83 (01/21 0540)  Labs:  Recent Labs  08/30/13 0533 08/31/13 0535 09/01/13 0458  HGB 8.6* 8.3*  --   HCT 26.8* 26.5*  --   PLT 107* 120*  --   LABPROT 16.3* 19.1* 20.3*  INR 1.34 1.66* 1.79*  CREATININE 0.99 0.95  --     Estimated Creatinine Clearance: 69.1 ml/min (by C-G formula based on Cr of 0.95).   Medications:  Scheduled   . allopurinol  300 mg Oral BID  . citalopram  20 mg Oral q morning - 10a  . docusate sodium  100 mg Oral BID  . enoxaparin (LOVENOX) injection  40 mg Subcutaneous Q24H  . feeding supplement (RESOURCE BREEZE)  1 Container Oral TID BM  . fentaNYL  100 mcg Transdermal Q72H  . ferrous sulfate  325 mg Oral BID  . gabapentin  600 mg Oral TID  . hydrocortisone  25 mg Rectal Q12H  . isosorbide mononitrate  30 mg Oral q morning - 10a  . levothyroxine  50 mcg Oral QAC breakfast  . metoprolol tartrate  12.5 mg Oral BID  . pantoprazole  40 mg Oral Daily  . polyethylene glycol  17 g Oral BID  . potassium chloride SA  20 mEq Oral BID  . thiamine  100 mg Oral Daily  . vancomycin  1,500 mg Intravenous Q24H  . Warfarin - Pharmacist Dosing Inpatient   Does not apply q1800  Inpatient warfarin doses administered 1/18-1/20: 5mg , 6mg , 5mg    Assessment: 73 yo female fell prior to admission and found to have right hip fx. Underwent R hemi arthroplasty 1/18. Patient was on warfarin 5mg  qpm PTA for hx DVT; also reportedly has IVC filter. Postoperatively warfarin was resumed and Lovenox 40mg  SQ  qAM was added until INR response adequate.  Patient will be discharged to SNF today- noted that she was originally prescribed Bactrim DS for her bilateral cellullitis. After discussing the interaction between Bactrim and warfarin with MD, he agreed to switch her to doxycycline.   Goal of Therapy:  INR 2-3  Monitor platelets by anticoagulation protocol: Yes   Plan:  1. Warfarin 5 mg PO daily will be resumed tonight at SNF 2. Continue Lovenox 40mg  subcut q24hr while INR <2 3. Patient will have repeat INR within 2-3 days after discharge   Silas Sacramento, PharmD Candidate  09/01/2013,9:05 AM

## 2013-09-01 NOTE — Discharge Summary (Signed)
Physician Discharge Summary  [name] [facility]:[id number] DOB: 28-Oct-1940 DOA: 08/29/2013  PCP: [name], MD  Admit date: 08/29/2013 Discharge date: 09/01/2013  Time spent: 35 minutes  Recommendations for Outpatient Follow-up:  1. Follow up with PCP in 1-2 weeks 2. Follow up with Orthopedic surgery as scheduled 3. Repeat INR within 2-3 days after discharge, stop lovenox when INR stays between 2-3 4. Patient will be discharged with foley cath given her limited mobility - please consider removing foley cath if/when patient has improved her mobility  Discharge Diagnoses:  Principal Problem:   Hip fracture Active Problems:   HYPERTENSION   Cellulitis   History of DVT of lower extremity   Anemia   Paraplegia following spinal cord injury   Hodgkin's lymphoma   Fibromyalgia   Discharge Condition: improved  Diet recommendation: heart healthy  Filed Weights   08/29/13 0500  Weight: 109.1 kg (240 lb 8.4 oz)    History of present illness:  [name] [name] is a 73 y.o. 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  The patient presented with an episode of fall that happen at home. She mentions that she is generally wheelchair-bound but can be what herself from wheelchair to bed and vice versa. Today she woke up from her sleep and wanted to go to the restroom at which time she tried to move herself from the bed to the wheelchair. She felt the wheelchair was locked and as soon as she stood up the wheelchair give away and she fell on the right knee and then on her right side. She was in significant pain and could not stand up and therefore she asked for help by the time her husband  arrived he found her on the ground and they called 911 and she was taken to the [facility].  She had some cough that started early this morning with yellow sputum production. She denies any fever or chills or chest pain.  She has baseline shortness of breath which is not getting worse and stable  She had an episode of nausea, one episode of vomiting without any blood, diffuse lower abdominal pain, few episode of diarrhea without any blood Friday night which resolved on Saturday morning.  She has on and off dizziness at her baseline.  At her baseline she is unable to more her legs due to her paraplegia from prior spinal surgery.  In early January 5 she mentions she was admitted to [facility] for right leg cellulitis and she was placed on Keflex and discharged on Saturday after 5 day hospital stay which he finished for 5 more days. She   Hospital Course:   right Hip fracture  S/o right hip hemiarthroplasty on 1/18. Continued on pain control with vicodin and prn dilaudid. Was given IV robaxin for muscle spasms  PT /OT eval  bowel regimen with pain meds Coumadin for DVT prophylaxis  B/l leg cellulitis.  Was admitted to [facility] 2 weeks ago for cellulitis of the rt leg.  On empiric vancomycin . switched to po doxycycline upon discharge.  History of DVT with IVC filter  INR on presentation was subtherapeutic.  resumed Coumadin post surgery INR trending up Was on Lovenox bridge hypertension and diastolic dysfunction  continued atenolol. Held amlodipine as well as Demadex as BP was soft.  History of Hodgkin's lymphoma  Reports stage IV disease.  Patient is on chemotherapy. Follows with Dr Evette Cristal at [facility].   DVT Prophylaxis: subcutaneous Heparin ,coumadin  Nutrition: cardiac    Procedures:  R hip hemiarthroplasty  Consultations:  Orthopedic surgery  Discharge Exam: Filed Vitals:   08/31/13 1600 08/31/13 2000 08/31/13 2135 09/01/13 0540  BP:   99/58 114/68   Pulse:   93 83  Temp:   99.2 F (37.3 C) 98.7 F (37.1 C)  TempSrc:   Oral Oral  Resp: 17 17 16 16   Height:      Weight:      SpO2:  97% 91% 92%    General: Awake, in nad Cardiovascular: regular, s1, s2 Respiratory: normal resp effort, now wheezing  Discharge Instructions      Discharge Orders   Future Appointments Provider Department Dept Phone   09/21/2013 2:00 PM Orvil Feil, NP [facility] [phone]   Future Orders Complete By Expires   Weight bearing as tolerated  As directed    Questions:     Laterality:     Extremity:         Medication List    STOP taking these medications       cephALEXin 500 MG capsule  Commonly known as:  KEFLEX     potassium chloride SA 20 MEQ tablet  Commonly known as:  K-DUR,KLOR-CON     torsemide 5 MG tablet  Commonly known as:  DEMADEX      TAKE these medications       allopurinol 300 MG tablet  Commonly known as:  ZYLOPRIM  Take 300 mg by mouth 2 (two) times daily.     ALPRAZolam 0.5 MG tablet  Commonly known as:  XANAX  Take 0.5 mg by mouth 3 (three) times daily as needed for anxiety. For anxiety     citalopram 20 MG tablet  Commonly known as:  CELEXA  Take 20 mg by mouth every morning.     doxycycline 100 MG capsule  Commonly known as:  VIBRAMYCIN  Take 1 capsule (100 mg total) by mouth 2 (two) times daily.     enoxaparin 40 MG/0.4ML injection  Commonly known as:  LOVENOX  Inject 0.4 mLs (40 mg total) into the skin daily.     fentaNYL 100 MCG/HR  Commonly known as:  DURAGESIC - dosed mcg/hr  Place 1 patch onto the skin every 3 (three) days.     ferrous sulfate 325 (65 FE) MG tablet  Take 1 tablet (325 mg total) by mouth 2 (two) times daily.     gabapentin 300 MG capsule  Commonly known as:  NEURONTIN  Take 1 capsule by mouth 3 (three) times daily.     isosorbide mononitrate 30 MG 24 hr tablet  Commonly known as:  IMDUR  Take 30 mg by mouth every morning.      levothyroxine 50 MCG tablet  Commonly known as:  SYNTHROID, LEVOTHROID  Take 50 mcg by mouth daily before breakfast.     methocarbamol 750 MG tablet  Commonly known as:  ROBAXIN  Take 750 mg by mouth 3 (three) times daily as needed for muscle spasms.     metoprolol tartrate 25 MG tablet  Commonly known as:  LOPRESSOR  Take 12.5 mg by mouth 2 (two) times daily.     omeprazole 40 MG capsule  Commonly known as:  PRILOSEC  Take 40 mg by mouth daily.     oxyCODONE 5 MG immediate release tablet  Commonly known as:  Oxy IR/ROXICODONE  Take 10 mg by mouth every 4 (four) hours as needed for pain.     oxyCODONE-acetaminophen 10-325 MG per tablet  Commonly known as:  PERCOCET  Take 1 tablet by mouth every 6 (six) hours as needed for pain.     predniSONE 20 MG tablet  Commonly known as:  DELTASONE  Take 100 mg by mouth See admin instructions. For 5 days following chemo treatment     thiamine 100 MG tablet  Commonly known as:  VITAMIN B-1  Take 1 tablet (100 mg total) by mouth daily.     vitamin B-12 1000 MCG tablet  Commonly known as:  CYANOCOBALAMIN  Take 1,000 mcg by mouth every morning.     warfarin 5 MG tablet  Commonly known as:  COUMADIN  Take 1 tablet by mouth every evening.       Allergies  Allergen Reactions  . Bee Venom Hives  . Codeine Palpitations    Per patient, "Feels like I'm having a heart attack."   Follow-up Information   Follow up with Shelda Pal, MD. Schedule an appointment as soon as possible for a visit in 2 weeks.   Specialty:  Orthopedic Surgery   Contact information:   [address] [place] [address] [phone]       Follow up with TAPPER,DAVID B, MD. Schedule an appointment as soon as possible for a visit in 1 week.   Specialty:  Family Medicine   Contact information:   [address] [place] [address] [phone]        The results of significant diagnostics from this hospitalization (including  imaging, microbiology, ancillary and laboratory) are listed below for reference.    Significant Diagnostic Studies: US Abdomen Complete  08/29/2013   CLINICAL DATA:  Diffuse left-sided abdominal discomfort, history of cholecystectomy and hysterectomy. Also history of diabetes in lymphoma  EXAM: ULTRASOUND ABDOMEN COMPLETE  COMPARISON:  None.  FINDINGS: Gallbladder:  The gallbladder is surgically absent. There are no abnormal findings in the gallbladder fossa.  Common bile duct:  Diameter: 8.9 mm which is not unusual in the post cholecystectomy state.  Liver:  The echotexture of the liver is mildly increased. There is no focal mass. No intrahepatic ductal dilation is demonstrated. Portal venous flow remains normal in direction toward the liver.  IVC:  No abnormality visualized.  Pancreas:  Visualized portion unremarkable.  Spleen:  The spleen exhibits normal echotexture and measures 8.4 cm in length.  Right Kidney:  Length: 10.4 cm. There is no hydronephrosis. Subjective mild thinning of the cortex is present. There is no focal mass  Left Kidney:  Length: 10.7 cm. There is no evidence of hydronephrosis or focal mass. The cortical echotexture appears normal but there is mild cortical thinning.  Abdominal aorta:  No aneurysm visualized.  Other findings:  No ascites is demonstrated.  IMPRESSION: 1. The gallbladder is surgically absent. No acute abnormality of the liver or pancreas or spleen is demonstrated. 2. The kidneys exhibit no acute abnormalities. There is mild cortical atrophy diffusely. 3. No ascites is demonstrated.   Electronically Signed   By: David  [place]   On: 08/29/2013 12:04   Dg Pelvis Portable  08/29/2013   CLINICAL DATA:  Postop hip surgery.  EXAM: PORTABLE PELVIS 1-2 VIEWS  COMPARISON:  08/28/2013  FINDINGS: A right hip arthroplasty has been  performed. The hip arthroplasty appears to be grossly located on these frontal images. Pelvic bony ring is intact. Postsurgical changes at the lumbosacral  junction. No gross abnormality to the left hip.  IMPRESSION: Right hip arthroplasty without complicating features.   Electronically Signed   By: Markus Daft M.D.   On: 08/29/2013 13:56   Dg Chest Portable 1 View  08/29/2013   CLINICAL DATA:  73 year old hypoxia.  EXAM: PORTABLE CHEST - 1 VIEW  COMPARISON:  07/16/2013, 06/28/2013 and chest CT 05/10/2013  FINDINGS: Port-A-Cath tip in the SVC region. Again noted are patchy densities in the right upper lung. These densities have not significantly changed since 06/28/2013 and may be related to known lung lesions. Again noted are coarse lung markings and cannot exclude small lung lesions based on previous CT findings. Heart size is within normal limits.  IMPRESSION: Patchy lung densities, particularly in the right upper lung. Findings could be related to known lung lesions. Difficult to exclude acute on chronic disease. Lungs may be better characterized with a chest CT.   Electronically Signed   By: Markus Daft M.D.   On: 08/29/2013 08:41    Microbiology: Recent Results (from the past 240 hour(s))  CULTURE, BLOOD (ROUTINE X 2)     Status: None   Collection Time    08/29/13  7:35 AM      Result Value Range Status   Specimen Description BLOOD LEFT HAND   Final   Special Requests BOTTLES DRAWN AEROBIC ONLY 1CC   Final   Culture  Setup Time     Final   Value: 08/29/2013 17:49     Performed at [facility]   Culture     Final   Value:        BLOOD CULTURE RECEIVED NO GROWTH TO DATE CULTURE WILL BE HELD FOR 5 DAYS BEFORE ISSUING A FINAL NEGATIVE REPORT     Performed at [facility]   Report Status PENDING   Incomplete  CULTURE, BLOOD (ROUTINE X 2)     Status: None   Collection Time    08/29/13  7:47 AM      Result Value Range Status   Specimen Description BLOOD LEFT ARM   Final   Special Requests BOTTLES DRAWN AEROBIC AND ANAEROBIC 1.5CC EACH   Final   Culture  Setup Time     Final   Value: 08/29/2013 23:17     Performed at [facility]   Culture     Final   Value:        BLOOD CULTURE RECEIVED NO GROWTH TO DATE CULTURE WILL BE HELD FOR 5 DAYS BEFORE ISSUING A FINAL NEGATIVE REPORT     Performed at [facility]   Report Status PENDING   Incomplete  SURGICAL PCR SCREEN     Status: None   Collection Time    08/29/13 10:11 AM      Result Value Range Status   MRSA, PCR NEGATIVE  NEGATIVE Final   Staphylococcus aureus NEGATIVE  NEGATIVE Final   Comment:            The Xpert SA Assay (FDA     approved for NASAL specimens     in patients over 60 years of age),     is one component of     a comprehensive surveillance     program.  Test performance has     been validated by [employer] for patients  greater     than or equal to 27 year old.     It is not intended     to diagnose infection nor to     guide or monitor treatment.     Labs: Basic Metabolic Panel:  Recent Labs Lab 08/29/13 0747 08/30/13 0533 08/31/13 0535  NA 137 139 140  K 4.6 4.6 5.1  CL 101 104 105  CO2 26 25 26   GLUCOSE 116* 132* 101*  BUN 20 22 21   CREATININE 1.01 0.99 0.95  CALCIUM 7.8* 7.7* 8.1*   Liver Function Tests:  Recent Labs Lab 08/29/13 0747  AST 26  ALT 17  ALKPHOS 137*  BILITOT 0.5  PROT 5.2*  ALBUMIN 2.4*   No results found for this basename: LIPASE, AMYLASE,  in the last 168 hours No results found for this basename: AMMONIA,  in the last 168 hours CBC:  Recent Labs Lab 08/29/13 0747 08/30/13 0533 08/31/13 0535  WBC 8.3 6.9 4.8  HGB 9.3* 8.6* 8.3*  HCT 29.6* 26.8* 26.5*  MCV 94.3 94.4 95.0  PLT 127* 107* 120*   Cardiac Enzymes: No results found for this basename: CKTOTAL, CKMB, CKMBINDEX, TROPONINI,  in the last 168 hours BNP: BNP (last 3 results) No results found for this basename: PROBNP,  in the last 8760 hours CBG: No results found for this basename: GLUCAP,  in the last 168 hours    Signed:  Lulamae Skorupski K  [facility] 09/01/2013, 9:00 AM

## 2013-09-02 ENCOUNTER — Ambulatory Visit: Payer: PRIVATE HEALTH INSURANCE | Admitting: Cardiovascular Disease

## 2013-09-04 LAB — CULTURE, BLOOD (ROUTINE X 2)
CULTURE: NO GROWTH
Culture: NO GROWTH

## 2013-09-21 ENCOUNTER — Ambulatory Visit: Payer: PRIVATE HEALTH INSURANCE | Admitting: Gastroenterology

## 2013-09-21 NOTE — Progress Notes (Signed)
REVIEWED.  

## 2013-10-14 ENCOUNTER — Ambulatory Visit: Payer: PRIVATE HEALTH INSURANCE | Admitting: Gastroenterology

## 2013-10-14 ENCOUNTER — Telehealth: Payer: Self-pay | Admitting: Gastroenterology

## 2013-10-14 NOTE — Telephone Encounter (Signed)
Pt was a no show

## 2013-10-18 NOTE — Telephone Encounter (Signed)
Please send letter.

## 2013-10-25 NOTE — Telephone Encounter (Signed)
Patient fell and broke her hip recently and is at [facility]

## 2013-11-08 ENCOUNTER — Other Ambulatory Visit: Payer: Self-pay | Admitting: Gastroenterology

## 2013-11-29 ENCOUNTER — Telehealth: Payer: Self-pay | Admitting: Cardiovascular Disease

## 2013-11-29 NOTE — Telephone Encounter (Signed)
Trying to contact patient to schedule appt

## 2014-02-09 DIAGNOSIS — I5031 Acute diastolic (congestive) heart failure: Principal | ICD-10-CM

## 2014-02-09 DIAGNOSIS — I350 Nonrheumatic aortic (valve) stenosis: Secondary | ICD-10-CM

## 2014-02-09 DIAGNOSIS — I214 Non-ST elevation (NSTEMI) myocardial infarction: Secondary | ICD-10-CM

## 2014-02-09 HISTORY — DX: Acute diastolic (congestive) heart failure: I50.31

## 2014-02-09 HISTORY — DX: Non-ST elevation (NSTEMI) myocardial infarction: I21.4

## 2014-02-09 HISTORY — DX: Nonrheumatic aortic (valve) stenosis: I35.0

## 2014-02-27 ENCOUNTER — Inpatient Hospital Stay (HOSPITAL_COMMUNITY)
Admission: EM | Admit: 2014-02-27 | Discharge: 2014-03-08 | DRG: 280 | Disposition: A | Discharge status: Home Health | Payer: PRIVATE HEALTH INSURANCE | Attending: Internal Medicine | Admitting: Internal Medicine

## 2014-02-27 ENCOUNTER — Encounter (HOSPITAL_COMMUNITY): Payer: Self-pay | Admitting: Emergency Medicine

## 2014-02-27 ENCOUNTER — Emergency Department (HOSPITAL_COMMUNITY): Payer: PRIVATE HEALTH INSURANCE

## 2014-02-27 DIAGNOSIS — J9601 Acute respiratory failure with hypoxia: Secondary | ICD-10-CM

## 2014-02-27 DIAGNOSIS — Z807 Family history of other malignant neoplasms of lymphoid, hematopoietic and related tissues: Secondary | ICD-10-CM

## 2014-02-27 DIAGNOSIS — Z85118 Personal history of other malignant neoplasm of bronchus and lung: Secondary | ICD-10-CM

## 2014-02-27 DIAGNOSIS — E119 Type 2 diabetes mellitus without complications: Secondary | ICD-10-CM | POA: Diagnosis present

## 2014-02-27 DIAGNOSIS — IMO0001 Reserved for inherently not codable concepts without codable children: Secondary | ICD-10-CM

## 2014-02-27 DIAGNOSIS — E7800 Pure hypercholesterolemia, unspecified: Secondary | ICD-10-CM

## 2014-02-27 DIAGNOSIS — J209 Acute bronchitis, unspecified: Secondary | ICD-10-CM | POA: Diagnosis present

## 2014-02-27 DIAGNOSIS — R739 Hyperglycemia, unspecified: Secondary | ICD-10-CM

## 2014-02-27 DIAGNOSIS — G8220 Paraplegia, unspecified: Secondary | ICD-10-CM | POA: Diagnosis present

## 2014-02-27 DIAGNOSIS — Z79899 Other long term (current) drug therapy: Secondary | ICD-10-CM | POA: Diagnosis not present

## 2014-02-27 DIAGNOSIS — R0602 Shortness of breath: Secondary | ICD-10-CM | POA: Diagnosis not present

## 2014-02-27 DIAGNOSIS — R0789 Other chest pain: Secondary | ICD-10-CM

## 2014-02-27 DIAGNOSIS — J810 Acute pulmonary edema: Secondary | ICD-10-CM

## 2014-02-27 DIAGNOSIS — I509 Heart failure, unspecified: Secondary | ICD-10-CM | POA: Diagnosis present

## 2014-02-27 DIAGNOSIS — J189 Pneumonia, unspecified organism: Secondary | ICD-10-CM | POA: Diagnosis present

## 2014-02-27 DIAGNOSIS — E875 Hyperkalemia: Secondary | ICD-10-CM

## 2014-02-27 DIAGNOSIS — G43909 Migraine, unspecified, not intractable, without status migrainosus: Secondary | ICD-10-CM

## 2014-02-27 DIAGNOSIS — I252 Old myocardial infarction: Secondary | ICD-10-CM

## 2014-02-27 DIAGNOSIS — Z8679 Personal history of other diseases of the circulatory system: Secondary | ICD-10-CM

## 2014-02-27 DIAGNOSIS — I10 Essential (primary) hypertension: Secondary | ICD-10-CM

## 2014-02-27 DIAGNOSIS — T380X5A Adverse effect of glucocorticoids and synthetic analogues, initial encounter: Secondary | ICD-10-CM | POA: Diagnosis present

## 2014-02-27 DIAGNOSIS — A419 Sepsis, unspecified organism: Secondary | ICD-10-CM

## 2014-02-27 DIAGNOSIS — R6521 Severe sepsis with septic shock: Secondary | ICD-10-CM

## 2014-02-27 DIAGNOSIS — M549 Dorsalgia, unspecified: Secondary | ICD-10-CM

## 2014-02-27 DIAGNOSIS — I2510 Atherosclerotic heart disease of native coronary artery without angina pectoris: Secondary | ICD-10-CM | POA: Diagnosis present

## 2014-02-27 DIAGNOSIS — Z8249 Family history of ischemic heart disease and other diseases of the circulatory system: Secondary | ICD-10-CM | POA: Diagnosis not present

## 2014-02-27 DIAGNOSIS — Z87440 Personal history of urinary (tract) infections: Secondary | ICD-10-CM | POA: Diagnosis not present

## 2014-02-27 DIAGNOSIS — Z96649 Presence of unspecified artificial hip joint: Secondary | ICD-10-CM | POA: Diagnosis not present

## 2014-02-27 DIAGNOSIS — N289 Disorder of kidney and ureter, unspecified: Secondary | ICD-10-CM

## 2014-02-27 DIAGNOSIS — Z993 Dependence on wheelchair: Secondary | ICD-10-CM

## 2014-02-27 DIAGNOSIS — D638 Anemia in other chronic diseases classified elsewhere: Secondary | ICD-10-CM | POA: Diagnosis present

## 2014-02-27 DIAGNOSIS — R600 Localized edema: Secondary | ICD-10-CM

## 2014-02-27 DIAGNOSIS — I5031 Acute diastolic (congestive) heart failure: Secondary | ICD-10-CM | POA: Diagnosis present

## 2014-02-27 DIAGNOSIS — K529 Noninfective gastroenteritis and colitis, unspecified: Secondary | ICD-10-CM

## 2014-02-27 DIAGNOSIS — I359 Nonrheumatic aortic valve disorder, unspecified: Secondary | ICD-10-CM | POA: Diagnosis present

## 2014-02-27 DIAGNOSIS — Z6838 Body mass index (BMI) 38.0-38.9, adult: Secondary | ICD-10-CM

## 2014-02-27 DIAGNOSIS — R601 Generalized edema: Secondary | ICD-10-CM

## 2014-02-27 DIAGNOSIS — Z800 Family history of malignant neoplasm of digestive organs: Secondary | ICD-10-CM | POA: Diagnosis not present

## 2014-02-27 DIAGNOSIS — E873 Alkalosis: Secondary | ICD-10-CM | POA: Diagnosis present

## 2014-02-27 DIAGNOSIS — R399 Unspecified symptoms and signs involving the genitourinary system: Secondary | ICD-10-CM

## 2014-02-27 DIAGNOSIS — J159 Unspecified bacterial pneumonia: Secondary | ICD-10-CM

## 2014-02-27 DIAGNOSIS — C8190 Hodgkin lymphoma, unspecified, unspecified site: Secondary | ICD-10-CM | POA: Diagnosis present

## 2014-02-27 DIAGNOSIS — J9600 Acute respiratory failure, unspecified whether with hypoxia or hypercapnia: Secondary | ICD-10-CM

## 2014-02-27 DIAGNOSIS — E039 Hypothyroidism, unspecified: Secondary | ICD-10-CM | POA: Diagnosis present

## 2014-02-27 DIAGNOSIS — Z833 Family history of diabetes mellitus: Secondary | ICD-10-CM | POA: Diagnosis not present

## 2014-02-27 DIAGNOSIS — Z86711 Personal history of pulmonary embolism: Secondary | ICD-10-CM

## 2014-02-27 DIAGNOSIS — I350 Nonrheumatic aortic (valve) stenosis: Secondary | ICD-10-CM

## 2014-02-27 DIAGNOSIS — K219 Gastro-esophageal reflux disease without esophagitis: Secondary | ICD-10-CM | POA: Diagnosis present

## 2014-02-27 DIAGNOSIS — Z9221 Personal history of antineoplastic chemotherapy: Secondary | ICD-10-CM | POA: Diagnosis not present

## 2014-02-27 DIAGNOSIS — I129 Hypertensive chronic kidney disease with stage 1 through stage 4 chronic kidney disease, or unspecified chronic kidney disease: Secondary | ICD-10-CM | POA: Diagnosis present

## 2014-02-27 DIAGNOSIS — G8929 Other chronic pain: Secondary | ICD-10-CM | POA: Diagnosis present

## 2014-02-27 DIAGNOSIS — R930 Abnormal findings on diagnostic imaging of skull and head, not elsewhere classified: Secondary | ICD-10-CM

## 2014-02-27 DIAGNOSIS — I214 Non-ST elevation (NSTEMI) myocardial infarction: Secondary | ICD-10-CM

## 2014-02-27 DIAGNOSIS — D62 Acute posthemorrhagic anemia: Secondary | ICD-10-CM

## 2014-02-27 DIAGNOSIS — Z8673 Personal history of transient ischemic attack (TIA), and cerebral infarction without residual deficits: Secondary | ICD-10-CM

## 2014-02-27 DIAGNOSIS — Z9884 Bariatric surgery status: Secondary | ICD-10-CM

## 2014-02-27 DIAGNOSIS — E876 Hypokalemia: Secondary | ICD-10-CM

## 2014-02-27 DIAGNOSIS — R6520 Severe sepsis without septic shock: Secondary | ICD-10-CM

## 2014-02-27 DIAGNOSIS — K5900 Constipation, unspecified: Secondary | ICD-10-CM

## 2014-02-27 DIAGNOSIS — N189 Chronic kidney disease, unspecified: Secondary | ICD-10-CM | POA: Diagnosis present

## 2014-02-27 DIAGNOSIS — R609 Edema, unspecified: Secondary | ICD-10-CM

## 2014-02-27 DIAGNOSIS — Z66 Do not resuscitate: Secondary | ICD-10-CM | POA: Diagnosis present

## 2014-02-27 DIAGNOSIS — T50905A Adverse effect of unspecified drugs, medicaments and biological substances, initial encounter: Secondary | ICD-10-CM

## 2014-02-27 DIAGNOSIS — M797 Fibromyalgia: Secondary | ICD-10-CM

## 2014-02-27 DIAGNOSIS — N179 Acute kidney failure, unspecified: Secondary | ICD-10-CM

## 2014-02-27 DIAGNOSIS — Z86718 Personal history of other venous thrombosis and embolism: Secondary | ICD-10-CM

## 2014-02-27 HISTORY — DX: Pneumonia, unspecified organism: J18.9

## 2014-02-27 HISTORY — DX: Nonrheumatic aortic (valve) stenosis: I35.0

## 2014-02-27 HISTORY — DX: Acute diastolic (congestive) heart failure: I50.31

## 2014-02-27 HISTORY — DX: Non-Hodgkin lymphoma, unspecified, unspecified site: C85.90

## 2014-02-27 HISTORY — DX: Non-ST elevation (NSTEMI) myocardial infarction: I21.4

## 2014-02-27 LAB — URINALYSIS, ROUTINE W REFLEX MICROSCOPIC
Bilirubin Urine: NEGATIVE
Glucose, UA: NEGATIVE mg/dL
Hgb urine dipstick: NEGATIVE
KETONES UR: NEGATIVE mg/dL
NITRITE: NEGATIVE
Protein, ur: NEGATIVE mg/dL
SPECIFIC GRAVITY, URINE: 1.02 (ref 1.005–1.030)
UROBILINOGEN UA: 0.2 mg/dL (ref 0.0–1.0)
pH: 5 (ref 5.0–8.0)

## 2014-02-27 LAB — COMPREHENSIVE METABOLIC PANEL
ALT: 26 U/L (ref 0–35)
AST: 34 U/L (ref 0–37)
Albumin: 2.5 g/dL — ABNORMAL LOW (ref 3.5–5.2)
Alkaline Phosphatase: 174 U/L — ABNORMAL HIGH (ref 39–117)
Anion gap: 11 (ref 5–15)
BUN: 24 mg/dL — ABNORMAL HIGH (ref 6–23)
CALCIUM: 8.1 mg/dL — AB (ref 8.4–10.5)
CO2: 23 mEq/L (ref 19–32)
Chloride: 101 mEq/L (ref 96–112)
Creatinine, Ser: 1.3 mg/dL — ABNORMAL HIGH (ref 0.50–1.10)
GFR calc Af Amer: 46 mL/min — ABNORMAL LOW (ref >90)
GFR calc non Af Amer: 40 mL/min — ABNORMAL LOW (ref >90)
Glucose, Bld: 159 mg/dL — ABNORMAL HIGH (ref 70–99)
Potassium: 5.8 mEq/L — ABNORMAL HIGH (ref 3.7–5.3)
Sodium: 135 mEq/L — ABNORMAL LOW (ref 137–147)
Total Bilirubin: 0.2 mg/dL — ABNORMAL LOW (ref 0.3–1.2)
Total Protein: 6.2 g/dL (ref 6.0–8.3)

## 2014-02-27 LAB — URINE MICROSCOPIC-ADD ON

## 2014-02-27 LAB — CBC WITH DIFFERENTIAL/PLATELET
BASOS ABS: 0 10*3/uL (ref 0.0–0.1)
BASOS PCT: 0 % (ref 0–1)
Eosinophils Absolute: 0 10*3/uL (ref 0.0–0.7)
Eosinophils Relative: 0 % (ref 0–5)
HEMATOCRIT: 36 % (ref 36.0–46.0)
Hemoglobin: 11.1 g/dL — ABNORMAL LOW (ref 12.0–15.0)
Lymphocytes Relative: 14 % (ref 12–46)
Lymphs Abs: 0.8 10*3/uL (ref 0.7–4.0)
MCH: 29.1 pg (ref 26.0–34.0)
MCHC: 30.8 g/dL (ref 30.0–36.0)
MCV: 94.2 fL (ref 78.0–100.0)
Monocytes Absolute: 0.7 10*3/uL (ref 0.1–1.0)
Monocytes Relative: 11 % (ref 3–12)
Neutro Abs: 4.5 10*3/uL (ref 1.7–7.7)
Neutrophils Relative %: 75 % (ref 43–77)
Platelets: 179 10*3/uL (ref 150–400)
RBC: 3.82 MIL/uL — ABNORMAL LOW (ref 3.87–5.11)
RDW: 15 % (ref 11.5–15.5)
WBC: 6 10*3/uL (ref 4.0–10.5)

## 2014-02-27 LAB — TROPONIN I
TROPONIN I: 0.47 ng/mL — AB (ref <0.30)
Troponin I: 0.43 ng/mL (ref <0.30)
Troponin I: <0.3 ng/mL (ref <0.30)

## 2014-02-27 LAB — STREP PNEUMONIAE URINARY ANTIGEN: Strep Pneumo Urinary Antigen: NEGATIVE

## 2014-02-27 LAB — PRO B NATRIURETIC PEPTIDE: PRO B NATRI PEPTIDE: 7765 pg/mL — AB (ref 0–125)

## 2014-02-27 MED ORDER — PANTOPRAZOLE SODIUM 40 MG PO TBEC
40.0000 mg | DELAYED_RELEASE_TABLET | Freq: Every day | ORAL | Status: DC
  Administered 2014-02-27 – 2014-03-08 (×10): 40 mg via ORAL
  Filled 2014-02-27 (×10): qty 1

## 2014-02-27 MED ORDER — SODIUM CHLORIDE 0.9 % IJ SOLN: 3.0000 mL | INTRAMUSCULAR | Status: DC | PRN

## 2014-02-27 MED ORDER — FUROSEMIDE 10 MG/ML IJ SOLN
40.0000 mg | Freq: Every day | INTRAMUSCULAR | Status: DC
  Administered 2014-02-27 – 2014-02-28 (×2): 40 mg via INTRAVENOUS
  Filled 2014-02-27 (×2): qty 4

## 2014-02-27 MED ORDER — SODIUM CHLORIDE 0.9 % IV SOLN: 250.0000 mL | INTRAVENOUS | Status: DC | PRN

## 2014-02-27 MED ORDER — LEVOFLOXACIN IN D5W 750 MG/150ML IV SOLN
750.0000 mg | Freq: Once | INTRAVENOUS | Status: AC
  Administered 2014-02-27: 750 mg via INTRAVENOUS
  Filled 2014-02-27: qty 150

## 2014-02-27 MED ORDER — FENTANYL 100 MCG/HR TD PT72
100.0000 ug | MEDICATED_PATCH | TRANSDERMAL | Status: DC
  Administered 2014-02-27 – 2014-03-08 (×4): 100 ug via TRANSDERMAL
  Filled 2014-02-27: qty 2
  Filled 2014-02-27 (×3): qty 1

## 2014-02-27 MED ORDER — CITALOPRAM HYDROBROMIDE 20 MG PO TABS
20.0000 mg | ORAL_TABLET | Freq: Every morning | ORAL | Status: DC
  Administered 2014-02-28 – 2014-03-08 (×9): 20 mg via ORAL
  Filled 2014-02-27 (×11): qty 1

## 2014-02-27 MED ORDER — ALUM & MAG HYDROXIDE-SIMETH 200-200-20 MG/5ML PO SUSP
30.0000 mL | Freq: Four times a day (QID) | ORAL | Status: DC | PRN
  Administered 2014-03-05: 30 mL via ORAL
  Filled 2014-02-27: qty 30

## 2014-02-27 MED ORDER — VANCOMYCIN HCL 10 G IV SOLR
1500.0000 mg | INTRAVENOUS | Status: DC
  Administered 2014-02-28 – 2014-03-01 (×2): 1500 mg via INTRAVENOUS
  Filled 2014-02-27 (×3): qty 1500

## 2014-02-27 MED ORDER — LORAZEPAM 0.5 MG PO TABS
0.5000 mg | ORAL_TABLET | Freq: Three times a day (TID) | ORAL | Status: DC | PRN
  Administered 2014-03-01 – 2014-03-05 (×5): 0.5 mg via ORAL
  Filled 2014-02-27 (×5): qty 1

## 2014-02-27 MED ORDER — ALPRAZOLAM 0.5 MG PO TABS
0.5000 mg | ORAL_TABLET | Freq: Two times a day (BID) | ORAL | Status: DC
  Administered 2014-02-27 – 2014-03-08 (×18): 0.5 mg via ORAL
  Filled 2014-02-27 (×18): qty 1

## 2014-02-27 MED ORDER — VANCOMYCIN HCL IN DEXTROSE 1-5 GM/200ML-% IV SOLN
1000.0000 mg | INTRAVENOUS | Status: AC
  Administered 2014-02-27: 1000 mg via INTRAVENOUS
  Filled 2014-02-27: qty 200

## 2014-02-27 MED ORDER — FUROSEMIDE 40 MG PO TABS
40.0000 mg | ORAL_TABLET | Freq: Once | ORAL | Status: AC
  Administered 2014-02-27: 40 mg via ORAL
  Filled 2014-02-27: qty 1

## 2014-02-27 MED ORDER — LEVOFLOXACIN IN D5W 750 MG/150ML IV SOLN: 750.0000 mg | INTRAVENOUS | Status: DC

## 2014-02-27 MED ORDER — ASPIRIN 81 MG PO CHEW
81.0000 mg | CHEWABLE_TABLET | Freq: Every day | ORAL | Status: DC
  Administered 2014-02-27 – 2014-03-08 (×10): 81 mg via ORAL
  Filled 2014-02-27 (×10): qty 1

## 2014-02-27 MED ORDER — ALBUTEROL SULFATE (2.5 MG/3ML) 0.083% IN NEBU: 2.5000 mg | INHALATION_SOLUTION | RESPIRATORY_TRACT | Status: DC | PRN

## 2014-02-27 MED ORDER — GABAPENTIN 300 MG PO CAPS
300.0000 mg | ORAL_CAPSULE | Freq: Three times a day (TID) | ORAL | Status: DC
  Administered 2014-02-27 – 2014-03-08 (×27): 300 mg via ORAL
  Filled 2014-02-27 (×33): qty 1

## 2014-02-27 MED ORDER — SODIUM POLYSTYRENE SULFONATE 15 GM/60ML PO SUSP
45.0000 g | Freq: Once | ORAL | Status: AC
  Administered 2014-02-27: 45 g via ORAL
  Filled 2014-02-27 (×2): qty 180

## 2014-02-27 MED ORDER — ACETAMINOPHEN 325 MG PO TABS
650.0000 mg | ORAL_TABLET | Freq: Four times a day (QID) | ORAL | Status: DC | PRN
  Administered 2014-02-28 – 2014-03-08 (×10): 650 mg via ORAL
  Filled 2014-02-27 (×11): qty 2

## 2014-02-27 MED ORDER — ISOSORBIDE MONONITRATE ER 60 MG PO TB24
30.0000 mg | ORAL_TABLET | Freq: Every morning | ORAL | Status: DC
  Administered 2014-02-28: 30 mg via ORAL
  Filled 2014-02-27 (×3): qty 1

## 2014-02-27 MED ORDER — ONDANSETRON HCL 4 MG/2ML IJ SOLN: 4.0000 mg | Freq: Four times a day (QID) | INTRAMUSCULAR | Status: DC | PRN

## 2014-02-27 MED ORDER — IPRATROPIUM-ALBUTEROL 0.5-2.5 (3) MG/3ML IN SOLN
3.0000 mL | Freq: Four times a day (QID) | RESPIRATORY_TRACT | Status: DC
  Administered 2014-02-27 – 2014-02-28 (×6): 3 mL via RESPIRATORY_TRACT
  Filled 2014-02-27 (×6): qty 3

## 2014-02-27 MED ORDER — METOPROLOL TARTRATE 25 MG PO TABS
25.0000 mg | ORAL_TABLET | Freq: Every day | ORAL | Status: DC
  Administered 2014-02-27 – 2014-02-28 (×2): 25 mg via ORAL
  Filled 2014-02-27 (×2): qty 1

## 2014-02-27 MED ORDER — IPRATROPIUM-ALBUTEROL 0.5-2.5 (3) MG/3ML IN SOLN: 3.0000 mL | Freq: Four times a day (QID) | RESPIRATORY_TRACT | Status: DC | PRN

## 2014-02-27 MED ORDER — ONDANSETRON HCL 4 MG PO TABS: 4.0000 mg | ORAL_TABLET | Freq: Four times a day (QID) | ORAL | Status: DC | PRN

## 2014-02-27 MED ORDER — SODIUM CHLORIDE 0.9 % IJ SOLN
3.0000 mL | Freq: Two times a day (BID) | INTRAMUSCULAR | Status: DC
  Administered 2014-02-27 – 2014-03-08 (×18): 3 mL via INTRAVENOUS

## 2014-02-27 MED ORDER — BUPROPION HCL ER (SR) 150 MG PO TB12
150.0000 mg | ORAL_TABLET | Freq: Two times a day (BID) | ORAL | Status: DC
  Administered 2014-02-27 – 2014-03-08 (×17): 150 mg via ORAL
  Filled 2014-02-27 (×22): qty 1

## 2014-02-27 MED ORDER — BUPROPION HCL ER (SR) 150 MG PO TB12
ORAL_TABLET | ORAL | Status: AC
  Filled 2014-02-27: qty 1

## 2014-02-27 MED ORDER — ENOXAPARIN SODIUM 40 MG/0.4ML ~~LOC~~ SOLN
40.0000 mg | SUBCUTANEOUS | Status: DC
  Administered 2014-02-27 – 2014-02-28 (×2): 40 mg via SUBCUTANEOUS
  Filled 2014-02-27 (×2): qty 0.4

## 2014-02-27 MED ORDER — LEVOTHYROXINE SODIUM 50 MCG PO TABS
50.0000 ug | ORAL_TABLET | Freq: Every day | ORAL | Status: DC
  Administered 2014-02-28 – 2014-03-08 (×9): 50 ug via ORAL
  Filled 2014-02-27 (×12): qty 1

## 2014-02-27 MED ORDER — DOCUSATE SODIUM 100 MG PO CAPS
100.0000 mg | ORAL_CAPSULE | Freq: Two times a day (BID) | ORAL | Status: DC
  Administered 2014-02-28 – 2014-03-08 (×17): 100 mg via ORAL
  Filled 2014-02-27 (×21): qty 1

## 2014-02-27 MED ORDER — ACETAMINOPHEN 650 MG RE SUPP: 650.0000 mg | Freq: Four times a day (QID) | RECTAL | Status: DC | PRN

## 2014-02-27 MED ORDER — GUAIFENESIN ER 600 MG PO TB12
600.0000 mg | ORAL_TABLET | Freq: Two times a day (BID) | ORAL | Status: DC
  Administered 2014-02-27 – 2014-03-08 (×18): 600 mg via ORAL
  Filled 2014-02-27 (×22): qty 1

## 2014-02-27 NOTE — Progress Notes (Signed)
ANTIBIOTIC CONSULT NOTE - INITIAL  Pharmacy Consult for Vancomycin & Levaquin  Indication: pneumonia  Allergies  Allergen Reactions  . Bee Venom Hives  . Codeine Palpitations    Per patient, "Feels like I'm having a heart attack."    Patient Measurements: Height: 5\' 7"  (170.2 cm) Weight: 252 lb (114.306 kg) IBW/kg (Calculated) : 61.6  Vital Signs: Temp: 98.9 F (37.2 C) (07/19 1842) Temp src: Rectal (07/19 1216) BP: 98/45 mmHg (07/19 1842) Pulse Rate: 75 (07/19 1842) Intake/Output from previous day:   Intake/Output from this shift:    Labs:  Recent Labs  02/27/14 1101  WBC 6.0  HGB 11.1*  PLT 179  CREATININE 1.30*   Estimated Creatinine Clearance: 51.1 ml/min (by C-G formula based on Cr of 1.3). No results found for this basename: VANCOTROUGH, VANCOPEAK, VANCORANDOM, GENTTROUGH, GENTPEAK, GENTRANDOM, TOBRATROUGH, TOBRAPEAK, TOBRARND, AMIKACINPEAK, AMIKACINTROU, AMIKACIN,  in the last 72 hours   Microbiology: No results found for this or any previous visit (from the past 720 hour(s)).  Medical History: Past Medical History  Diagnosis Date  . Non Hodgkin's lymphoma   . Other chronic pain   . Esophageal reflux   . Unspecified essential hypertension   . Personal history of pulmonary embolism   . Coronary atherosclerosis of native coronary artery     CATHETERIZATION X 3  . Dysthymic disorder   . Morbid obesity   . Fibromyalgia   . GERD (gastroesophageal reflux disease)   . Anxiety   . Myocardial infarction   . TIA (transient ischemic attack)   . Complication of anesthesia   . PONV (postoperative nausea and vomiting)     Medications:  Scheduled:  . ALPRAZolam  0.5 mg Oral BID  . aspirin  81 mg Oral Daily  . buPROPion  150 mg Oral BID  . [START ON 02/28/2014] citalopram  20 mg Oral q morning - 10a  . docusate sodium  100 mg Oral BID  . enoxaparin  40 mg Subcutaneous Q24H  . fentaNYL  100 mcg Transdermal Q72H  . furosemide  40 mg Intravenous Daily  .  gabapentin  300 mg Oral TID  . guaiFENesin  600 mg Oral BID  . ipratropium-albuterol  3 mL Nebulization Q6H  . [START ON 02/28/2014] isosorbide mononitrate  30 mg Oral q morning - 10a  . [START ON 02/28/2014] levothyroxine  50 mcg Oral QAC breakfast  . metoprolol tartrate  25 mg Oral Daily  . pantoprazole  40 mg Oral Daily  . sodium chloride  3 mL Intravenous Q12H   Assessment: 73 yo obese, paraplegic F who presents with SOB and productive cough.  She also completed 7 day course of IM Rocephin for UTI yesterday.   She is starting on broad-spectrum antibiotics for PNA/bronchitis due to recent rehab stay and immunocompromised stated.  Scr is elevated above baseline.  Normalized CrCl ~ 73ml/min.    Vancomycin 7/19>> Levaquin 7/19>>  Goal of Therapy:  Vancomycin trough level 15-20 mcg/ml  Plan:  Levaquin 750mg  IV q48h Vancomycin 1500mg  IV q24h Check Vancomycin trough at steady state Monitor renal function and cx data   Biagio Borg 02/27/2014,10:16 PM

## 2014-02-27 NOTE — ED Notes (Signed)
Attempted to call report. Caryl Pina unavailable to take report, will return call.

## 2014-02-27 NOTE — ED Notes (Signed)
CRITICAL VALUE ALERT  Critical value received:  Troponin  Date of notification:  02/27/14 Time of notification: 6811  Critical value read back:0.45  Nurse who received alert:  Di Kindle, RN  MD notified (1st page):  Dr. Jeanell Sparrow  Time of first page:  1145  MD notified (2nd page):  Time of second page:   Responding MD:  Dr. Jeanell Sparrow  Time MD responded:  [phone]

## 2014-02-27 NOTE — ED Notes (Signed)
[name] [name], daughter, [phone].

## 2014-02-27 NOTE — ED Notes (Signed)
CRITICAL VALUE ALERT  Critical value received:  troponin  Date of notification:  02/27/14  Time of notification:  1507  Critical value read back:Yes.    Nurse who received alert:  t Jodeci Rini rn  MD notified (1st page):  Ray  Time of first page:  1508  MD notified (2nd page):  Time of second page:  Responding MD:    Time MD responded:

## 2014-02-27 NOTE — ED Notes (Signed)
Initial O2 sat was 84% and went went up to 93% on 4L on scene per EMS. . Edema to lower extremities. Cough, fever present. Released from [facility] a month ago, per EMS

## 2014-02-27 NOTE — ED Provider Notes (Signed)
CSN: [id number]     Arrival date & time 02/27/14  1005 History   This chart was scribed for Shaune Pollack, MD by Martinique Peace, ED Scribe. The patient was seen in [place]. The patient's care was started at 10:18 AM.    Chief Complaint  Patient presents with  . Shortness of Breath      Patient is a 73 y.o. female presenting with shortness of breath. The history is provided by the patient and a relative. No language interpreter was used.  Shortness of Breath Progression:  Worsening Associated symptoms: cough and fever (max: 100.4)   Cough:    Cough characteristics:  Productive  HPI Comments: [name] [name] is a 73 y.o. female who presents to the Emergency Department complaining of SOB onset early this morning with associated productive cough and max fever by mouth of 100.4. Pt was in the nursing home until Thursday because she was previously hospitalized for UTI and pneumonia and was not able to return straight home. Pt's daughter states that since returning home, symptoms have worsened and she has not gotten better. Pt's initial O2 Sat was 84% and was given 4L on scene which raised it to 93% PTA. Pt has edema to LE's. Pt's daughter states that she has not had anything to drink or eat. Her PCP is Dr. Scotty Court.  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 No palliative care consult had been obtained per family.  Past Medical History  Diagnosis Date  . Other chest pain   . Other chronic pain   . Esophageal reflux   . Unspecified essential hypertension   . Personal history of pulmonary embolism   . Myalgia and myositis, unspecified   . Coronary atherosclerosis of native coronary artery     CATHETERIZATION X 3  . Dysthymic disorder   . Morbid obesity   . Fibromyalgia   . GERD (gastroesophageal reflux disease)   . Anxiety   . Myocardial infarction   . TIA (transient  ischemic attack)   . Acute kidney injury   . Complication of anesthesia   . PONV (postoperative nausea and vomiting)    Past Surgical History  Procedure Laterality Date  . Back surgery      X 2 FOR DISK PROBLEMS. three total back surgery  . Abdominal hysterectomy    . Gastric bypass      HISTORY OF  . Carpal tunnel release      bilateral  . Bladder suspension    . Cholecystectomy    . Colonoscopy      Three, h/o polyps. last one ?2 years ago Dr. Britta Mccreedy  . Esophagogastroduodenoscopy      remote  . Colonoscopy, esophagogastroduodenoscopy (egd) and esophageal dilation N/A 01/23/2013    GUY:[id number] hemorrhoids otherwise normal colonoscopy/Mild changes of esophagitis at GE junction(focal erythema).Small gastric pouch with patent gastrojejunostomy.Normal mucosa of proximal small bowel  . Givens capsule study N/A 01/24/2013    Procedure: GIVENS CAPSULE STUDY;  Surgeon: Rogene Houston, MD;  Location: [facility];  Service: Endoscopy;  Laterality: N/A;  . Hip arthroplasty Right 08/29/2013    Procedure: HEMI ARTHROPLASTY right hip;  Surgeon: Mauri Pole, MD;  Location: [facility];  Service: Orthopedics;  Laterality: Right;   Family History  Problem Relation Age of Onset  . Coronary artery disease      FAMILY HISTORY  . Diabetes type II Mother   .  Heart disease Mother   . Lymphoma Father   . Colon cancer Sister     late 29s   History  Substance Use Topics  . Smoking status: Never Smoker   . Smokeless tobacco: Never Used  . Alcohol Use: No   OB History   Grav Para Term Preterm Abortions TAB SAB Ect Mult Living                 Review of Systems  Constitutional: Positive for fever (max: 100.4).  Respiratory: Positive for cough and shortness of breath.       Allergies  Bee venom and Codeine  Home Medications   Prior to Admission medications   Medication Sig Start Date End Date Taking? Authorizing Provider  albuterol-ipratropium (COMBIVENT) 18-103 MCG/ACT inhaler Inhale  1 puff into the lungs every 6 (six) hours as needed for wheezing or shortness of breath.   Yes Historical Provider, MD  ALPRAZolam Duanne Moron) 0.5 MG tablet Take 0.5 mg by mouth 2 (two) times daily. For anxiety   Yes Historical Provider, MD  buPROPion (WELLBUTRIN SR) 150 MG 12 hr tablet Take 150 mg by mouth 2 (two) times daily.   Yes Historical Provider, MD  calcium-vitamin D (OSCAL WITH D) 500-200 MG-UNIT per tablet Take 1 tablet by mouth 2 (two) times daily.   Yes Historical Provider, MD  cholecalciferol (VITAMIN D) 1000 UNITS tablet Take 1,000 Units by mouth daily.   Yes Historical Provider, MD  citalopram (CELEXA) 20 MG tablet Take 20 mg by mouth every morning.  02/09/13  Yes Historical Provider, MD  docusate sodium (COLACE) 100 MG capsule Take 100 mg by mouth 2 (two) times daily.   Yes Historical Provider, MD  enoxaparin (LOVENOX) 40 MG/0.4ML injection Inject 0.4 mLs (40 mg total) into the skin daily. 09/01/13  Yes Lucille Passy Babish, PA-C  fentaNYL (DURAGESIC - DOSED MCG/HR) 100 MCG/HR Place 1 patch onto the skin every 3 (three) days.   Yes Historical Provider, MD  ferrous sulfate 325 (65 FE) MG tablet Take 325 mg by mouth daily with breakfast.   Yes Historical Provider, MD  furosemide (LASIX) 20 MG tablet Take 20 mg by mouth daily.   Yes Historical Provider, MD  gabapentin (NEURONTIN) 300 MG capsule Take 1 capsule by mouth 3 (three) times daily. 08/23/13  Yes Historical Provider, MD  isosorbide mononitrate (IMDUR) 30 MG 24 hr tablet Take 30 mg by mouth every morning.   Yes Historical Provider, MD  levothyroxine (SYNTHROID, LEVOTHROID) 50 MCG tablet Take 50 mcg by mouth daily before breakfast.   Yes Historical Provider, MD  LORazepam (ATIVAN) 0.5 MG tablet Take 0.5 mg by mouth 3 (three) times daily as needed for anxiety.   Yes Historical Provider, MD  metoprolol tartrate (LOPRESSOR) 25 MG tablet Take 25 mg by mouth daily.   Yes Historical Provider, MD  Multiple Vitamins-Minerals (MULTIVITAMINS THER.  W/MINERALS) TABS tablet Take 1 tablet by mouth daily.   Yes Historical Provider, MD  ondansetron (ZOFRAN) 4 MG tablet Take 4 mg by mouth every 4 (four) hours as needed for nausea or vomiting.   Yes Historical Provider, MD  oxyCODONE-acetaminophen (PERCOCET/ROXICET) 5-325 MG per tablet Take 1 tablet by mouth every 6 (six) hours as needed for severe pain.   Yes Historical Provider, MD  pantoprazole (PROTONIX) 40 MG tablet Take 40 mg by mouth daily.   Yes Historical Provider, MD  potassium chloride SA (K-DUR,KLOR-CON) 20 MEQ tablet Take 20 mEq by mouth daily.   Yes Historical Provider, MD  vitamin B-12 (CYANOCOBALAMIN) 1000 MCG tablet Take 1,000 mcg by mouth every morning.    Yes Historical Provider, MD  vitamin C (ASCORBIC ACID) 500 MG tablet Take 500 mg by mouth daily. For 2 weeks.   Yes Historical Provider, MD  zinc gluconate 50 MG tablet Take 50 mg by mouth daily.   Yes Historical Provider, MD   BP 139/68  Pulse 118  Resp 16  SpO2 96% Physical Exam  Nursing note and vitals reviewed. Constitutional: She appears well-developed and well-nourished. No distress.  HENT:  Head: Normocephalic and atraumatic.  Eyes: Conjunctivae and EOM are normal.  Neck: Neck supple. No tracheal deviation present.  Cardiovascular: Normal rate.   Pulmonary/Chest: She has wheezes (diffuse).  Bronchi bilaterally at bases, worse in the R.   Musculoskeletal: Normal range of motion. She exhibits edema (Bilateral in legs, worse on R. ).  Neurological: She is alert.  Skin: Skin is warm and dry.  Psychiatric: She has a normal mood and affect. Her behavior is normal.    ED Course  Procedures (including critical care time) DIAGNOSTIC STUDIES: Oxygen Saturation is 93% on [place], adequate by my interpretation.    COORDINATION OF CARE: 10:23 AM- Treatment plan was discussed with patient who verbalizes understanding and agrees.    Labs Review Labs Reviewed  CBC WITH DIFFERENTIAL - Abnormal; Notable for the following:     RBC 3.82 (*)    Hemoglobin 11.1 (*)    All other components within normal limits  COMPREHENSIVE METABOLIC PANEL  TROPONIN I  PRO B NATRIURETIC PEPTIDE    Imaging Review Dg Chest Portable 1 View  02/27/2014   CLINICAL DATA:  Shortness of breath.  EXAM: PORTABLE CHEST - 1 VIEW  COMPARISON:  Portable mobile chest x-Deniel Mcquiston 02/20/2014. Portable chest 02/15/2014, 01/30/2014 [facility].  FINDINGS: Suboptimal inspiration due to body habitus accounts for crowded bronchovascular markings, especially in the bases, and accentuates the cardiac silhouette. Taking this into account, cardiac silhouette moderately enlarged but stable. Dense mitral annular calcification. Interval development of mild diffuse interstitial pulmonary edema. No confluent airspace consolidation. Right jugular Port-A-Cath tip projects over the mid to lower SVC.  IMPRESSION: Suboptimal inspiration. Mild CHF, with moderate cardiomegaly and mild diffuse interstitial pulmonary edema.   Electronically Signed   By: Evangeline Dakin M.D.   On: 02/27/2014 12:14     EKG Interpretation   Date/Time:  Sunday February 27 2014 10:13:00 EDT Ventricular Rate:  117 PR Interval:  175 QRS Duration: 102 QT Interval:  333 QTC Calculation: 465 R Axis:   -39 Text Interpretation:  Sinus tachycardia Left anterior fasicular block  Cannot rule out Inferior infarct Anterolateral infarct SINCE LAST TRACING  HEART RATE HAS INCREASED Confirmed by Tziporah Knoke MD, Andee Poles [phone]) on  02/27/2014 1:33:36 PM     Medications - No data to display  MDM   Final diagnoses:  Acute on chronic congestive heart failure, unspecified congestive heart failure type    This is a 72 year old female who was recently discharged home from the nursing home after admission for pneumonia and urinary tract infection. She was weak when she came home and has become more dyspneic this a.m. She has a mild fever here and has increased markings on her chest x-Laycee Fitzsimmons consistent with  congestive heart failure. She has received 40 mg of Lasix with some improvement. It is reported that her oxygen saturations were in the 80s upon EMS arrival. Here they have been in the mid to upper 90s on oxygen. She does have  an elevated troponin at 0.4 with a nonischemic EKG. She initially had no complaints of chest pain or discomfort but some pain with coughing. She again endorses the pain with coughing on the exam. I have discussed treatment options with the patient and her family. Her repeat troponin and EKG is being done- repeat troponin remains elevated at .43 but not changed from prior- likely due to chf.  Patient with some diuresis and feels improved.    Discussed with Dr. Wynelle Cleveland and she will see to admit.   I personally performed the services described in this documentation, which was scribed in my presence. The recorded information has been reviewed and considered.   Shaune Pollack, MD 02/27/14 [id number]

## 2014-02-27 NOTE — H&P (Addendum)
[facility] History and Physical  [name] SAY:[id number] DOB: Mar 29, 1941 DOA: 02/27/2014   PCP: [name], MD    Chief Complaint: Shortness of breath HPI: [name] [name] is a 73 y.o. female with paraplegia who is wheelchair-bound, a history of stage IV non-Hodgkin's lymphoma no longer being treated but also not on hospice, hypertension, nonobstructive coronary artery disease, TIA, history of PE in the past and chronic pain.  The patient presents for shortness of breath which she states started this morning she's had a cough for a few weeks now but but for the past 4 days it has been productive of cream-colored sputum. Her daughter states that her mother had broken out into a sweat this morning and was found in the ER to have a fever of 100.1. They also note that she has been gaining weight despite barely eating and her ankles are becoming more swollen. The daughter also states that the patient is being treated for a urinary tract infection and according to her medication list has received 7 days of IM Rocephin which ended yesterday. The patient admits to burning micturition. Other review of systems is as below. I am admitting her for pulmonary edema and possible underlying pneumonia versus a bronchitis. She also has acute kidney failure and hyperkalemia. She not seen her PCP in a few months due to the rehabilitation stay and has an appointment with her PCP in 10 days   General:+, fever, + weight gain with poor appetitie Cardiac: Denies chest pain, syncope, palpitations, + pedal edema  Respiratory: see HPI GI: Denies severe indigestion/heartburn, abdominal pain, nausea, vomiting, diarrhea and constipation GU: burning micturition for 5 weeks.  Musculoskeletal: Denies muscle pain +arthritis Skin: Denies suspicious skin lesions +itching  Neurologic: Denies focal weakness or numbness, change in vision  Past Medical History  Diagnosis Date  . Non Hodgkin's lymphoma   . Other  chronic pain   . Esophageal reflux   . Unspecified essential hypertension   . Personal history of pulmonary embolism   . Coronary atherosclerosis of native coronary artery     CATHETERIZATION X 3  . Dysthymic disorder   . Morbid obesity   . Fibromyalgia   . GERD (gastroesophageal reflux disease)   . Anxiety   . Myocardial infarction   . TIA (transient ischemic attack)   . Complication of anesthesia   . PONV (postoperative nausea and vomiting)    Past Surgical History  Procedure Laterality Date  . Back surgery      X 2 FOR DISK PROBLEMS. three total back surgery  . Abdominal hysterectomy    . Gastric bypass      HISTORY OF  . Carpal tunnel release      bilateral  . Bladder suspension    . Cholecystectomy    . Colonoscopy, esophagogastroduodenoscopy (egd) and esophageal dilation N/A 01/23/2013    ATF:TDDUKGUR hemorrhoids otherwise normal colonoscopy/Mild changes of esophagitis at GE junction(focal erythema).Small gastric pouch with patent gastrojejunostomy.Normal mucosa of proximal small bowel  . Givens capsule study N/A 01/24/2013    Procedure: GIVENS CAPSULE STUDY;  Surgeon: Rogene Houston, MD;  Location: [facility];  Service: Endoscopy;  Laterality: N/A;  . Hip arthroplasty Right 08/29/2013    Procedure: HEMI ARTHROPLASTY right hip;  Surgeon: Mauri Pole, MD;  Location: [facility];  Service: Orthopedics;  Laterality: Right;   Social History:  reports that she has never smoked. She has never used smokeless tobacco. She reports that she does not drink alcohol or  use illicit drugs. Lives at home with husband Cannot manage her ADLs- has an [facility] 8hrs a [facility]  Allergies  Allergen Reactions  . Bee Venom Hives  . Codeine Palpitations    Per patient, "Feels like I'm having a heart attack."    Family History  Problem Relation Age of Onset  . Coronary artery disease      FAMILY HISTORY  . Diabetes type II Mother   . Heart disease Mother   . Lymphoma  Father   . Colon cancer Sister     late 53s      Prior to Admission medications   Medication Sig Start Date End Date Taking? Authorizing Provider  albuterol-ipratropium (COMBIVENT) 18-103 MCG/ACT inhaler Inhale 1 puff into the lungs every 6 (six) hours as needed for wheezing or shortness of breath.   Yes Historical Provider, MD  ALPRAZolam Duanne Moron) 0.5 MG tablet Take 0.5 mg by mouth 2 (two) times daily. For anxiety   Yes Historical Provider, MD  buPROPion (WELLBUTRIN SR) 150 MG 12 hr tablet Take 150 mg by mouth 2 (two) times daily.   Yes Historical Provider, MD  calcium-vitamin D (OSCAL WITH D) 500-200 MG-UNIT per tablet Take 1 tablet by mouth 2 (two) times daily.   Yes Historical Provider, MD  cholecalciferol (VITAMIN D) 1000 UNITS tablet Take 1,000 Units by mouth daily.   Yes Historical Provider, MD  citalopram (CELEXA) 20 MG tablet Take 20 mg by mouth every morning.  02/09/13  Yes Historical Provider, MD  docusate sodium (COLACE) 100 MG capsule Take 100 mg by mouth 2 (two) times daily.   Yes Historical Provider, MD  enoxaparin (LOVENOX) 40 MG/0.4ML injection Inject 0.4 mLs (40 mg total) into the skin daily. 09/01/13  Yes Lucille Passy Babish, PA-C  fentaNYL (DURAGESIC - DOSED MCG/HR) 100 MCG/HR Place 1 patch onto the skin every 3 (three) days.   Yes Historical Provider, MD  ferrous sulfate 325 (65 FE) MG tablet Take 325 mg by mouth daily with breakfast.   Yes Historical Provider, MD  furosemide (LASIX) 20 MG tablet Take 20 mg by mouth daily.   Yes Historical Provider, MD  gabapentin (NEURONTIN) 300 MG capsule Take 1 capsule by mouth 3 (three) times daily. 08/23/13  Yes Historical Provider, MD  isosorbide mononitrate (IMDUR) 30 MG 24 hr tablet Take 30 mg by mouth every morning.   Yes Historical Provider, MD  levothyroxine (SYNTHROID, LEVOTHROID) 50 MCG tablet Take 50 mcg by mouth daily before breakfast.   Yes Historical Provider, MD  LORazepam (ATIVAN) 0.5 MG tablet Take 0.5 mg by mouth 3 (three)  times daily as needed for anxiety.   Yes Historical Provider, MD  metoprolol tartrate (LOPRESSOR) 25 MG tablet Take 25 mg by mouth daily.   Yes Historical Provider, MD  Multiple Vitamins-Minerals (MULTIVITAMINS THER. W/MINERALS) TABS tablet Take 1 tablet by mouth daily.   Yes Historical Provider, MD  ondansetron (ZOFRAN) 4 MG tablet Take 4 mg by mouth every 4 (four) hours as needed for nausea or vomiting.   Yes Historical Provider, MD  oxyCODONE-acetaminophen (PERCOCET/ROXICET) 5-325 MG per tablet Take 1 tablet by mouth every 6 (six) hours as needed for severe pain.   Yes Historical Provider, MD  pantoprazole (PROTONIX) 40 MG tablet Take 40 mg by mouth daily.   Yes Historical Provider, MD  potassium chloride SA (K-DUR,KLOR-CON) 20 MEQ tablet Take 20 mEq by mouth daily.   Yes Historical Provider, MD  vitamin B-12 (CYANOCOBALAMIN) 1000 MCG tablet  Take 1,000 mcg by mouth every morning.    Yes Historical Provider, MD  vitamin C (ASCORBIC ACID) 500 MG tablet Take 500 mg by mouth daily. For 2 weeks.   Yes Historical Provider, MD  zinc gluconate 50 MG tablet Take 50 mg by mouth daily.   Yes Historical Provider, MD     Physical Exam: Vitals Filed Vitals:   02/27/14 1400 02/27/14 1500 02/27/14 1506 02/27/14 1600  BP: 122/68 114/58  102/51  Pulse: 98   92  Temp:      TempSrc:      Resp: 19  20   SpO2: 99%  98% 99%      General: Awake and alert and oriented but sometimes gets confused when answering questions. She has a congested cough but otherwise is not in any respiratory distress. HEENT: Normocephalic and Atraumatic, Mucous membranes pink                PERRLA; EOM intact; No scleral icterus,                 Nares: Patent, Oropharynx: Clear, Fair Dentition                 Neck: FROM, no cervical lymphadenopathy, thyromegaly, carotid bruit or JVD;  Breasts: deferred CHEST WALL: No tenderness  CHEST: Bilateral rhonchi and crackles up to mid lung fields. No wheezing. Coughing up green sputum  in the room. HEART: Regular rate and rhythm; no murmurs rubs or gallops  BACK: No kyphosis or scoliosis; no CVA tenderness  ABDOMEN: Positive Bowel Sounds, soft, non-tender; no masses, no organomegaly Rectal Exam: deferred EXTREMITIES: No cyanosis, clubbing-bilateral 2+ pitting edema Genitalia: not examined  SKIN:  no rash or ulceration  CNS: Alert and Oriented x 4, Nonfocal exam, CN 2-12 intact  Labs on Admission:  Basic Metabolic Panel:  Recent Labs Lab 02/27/14 1101  NA 135*  K 5.8*  CL 101  CO2 23  GLUCOSE 159*  BUN 24*  CREATININE 1.30*  CALCIUM 8.1*   Liver Function Tests:  Recent Labs Lab 02/27/14 1101  AST 34  ALT 26  ALKPHOS 174*  BILITOT 0.2*  PROT 6.2  ALBUMIN 2.5*   No results found for this basename: LIPASE, AMYLASE,  in the last 168 hours No results found for this basename: AMMONIA,  in the last 168 hours CBC:  Recent Labs Lab 02/27/14 1101  WBC 6.0  NEUTROABS 4.5  HGB 11.1*  HCT 36.0  MCV 94.2  PLT 179   Cardiac Enzymes:  Recent Labs Lab 02/27/14 1101 02/27/14 1426  TROPONINI 0.47* 0.43*    BNP (last 3 results)  Recent Labs  02/27/14 1101  PROBNP 7765.0*   CBG: No results found for this basename: GLUCAP,  in the last 168 hours  Radiological Exams on Admission: Dg Chest Portable 1 View  02/27/2014   CLINICAL DATA:  Shortness of breath.  EXAM: PORTABLE CHEST - 1 VIEW  COMPARISON:  Portable mobile chest x-ray 02/20/2014. Portable chest 02/15/2014, 01/30/2014 [facility].  FINDINGS: Suboptimal inspiration due to body habitus accounts for crowded bronchovascular markings, especially in the bases, and accentuates the cardiac silhouette. Taking this into account, cardiac silhouette moderately enlarged but stable. Dense mitral annular calcification. Interval development of mild diffuse interstitial pulmonary edema. No confluent airspace consolidation. Right jugular Port-A-Cath tip projects over the mid to lower SVC.   IMPRESSION: Suboptimal inspiration. Mild CHF, with moderate cardiomegaly and mild diffuse interstitial pulmonary edema.   Electronically Signed   By:  Evangeline Dakin M.D.   On: 02/27/2014 12:14    EKG: Independently reviewed. Sinus tachycardia with inverted T wave in lead 3  Assessment/Plan Principal Problem:   Acute respiratory failure -Currently on 4 L of oxygen with a pulse ox of 99% (a) pulmonary edema:  She doesn't appear to have a history of congestive heart failure however she does have pulmonary edema on her chest x-ray and significant pedal edema. Although her renal function is elevated, it may be secondary to poor cardiac output rather than dehydration. She has received 40 mg of oral Lasix in the ER instead of the usual 20 that she receives at home. - I will continue 40 mg IV daily. -Obtain echo  (b) pneumonia versus acute bronchitis: -If this is a pneumonia we'll need to treat this as HCAP as she was in a skilled nursing facility 2 weeks ago (also for pneumonia) -Will treat with vancomycin and Levaquin-I suspect we'll be able to stop vancomycin in a few days. -Will need to repeat a chest x-ray in one to 2 days after she is adequately diuresed to see if there is any underlying pneumonia -Check strep pneumonia and Legionella antigens -Check sputum culture -Mucinex and duo nebs -SLP eval for aspiration   Active Problems: Fever -Treat for pneumonia and recheck UA  Elevated troponin -Likely secondary to respiratory failure-I will order a 3 more sets and add aspirin -We'll be obtaining an echo -Not a candidate for intervention with end-stage lymphoma    Acute renal failure -Possibly due to poor cardiac output if this is related to congestive heart failure -However could also be dehydration as she is on Lasix at home -Follow renal function closely with treatment with Lasix.    Hyperkalemia -Has been given Kayexalate in the ER and Lasix which should also help to improve-she  is on potassium supplements at home which I will hold  Recent UTI -Treated with Rocephin -Check UA    Paraplegia following spinal cord injury -Wheelchair-bound    Essential hypertension, benign -Continue home medications  Chronic pain -2 new fentanyl patches  Hypothyroid -Continue levothyroxin  Non-Hodgkin's lymphoma stage IV -Longer being treated  Consulted: None  Code Status: DNR --hospice will need to be discussed by her PCP  Family Communication: daughter Ezzie Dural- I have not spoken with the POA who is her son, douglas Neyland  Disposition Plan: to be determined  Time spent: > 17 min  [place], MD [facility]  If 7PM-7AM, please contact night-coverage [email or web address] 02/27/2014, 4:09 PM

## 2014-02-28 DIAGNOSIS — G8929 Other chronic pain: Secondary | ICD-10-CM

## 2014-02-28 DIAGNOSIS — M549 Dorsalgia, unspecified: Secondary | ICD-10-CM

## 2014-02-28 DIAGNOSIS — Z86711 Personal history of pulmonary embolism: Secondary | ICD-10-CM

## 2014-02-28 LAB — BASIC METABOLIC PANEL
ANION GAP: 11 (ref 5–15)
BUN: 24 mg/dL — AB (ref 6–23)
CALCIUM: 8.2 mg/dL — AB (ref 8.4–10.5)
CO2: 27 meq/L (ref 19–32)
Chloride: 101 mEq/L (ref 96–112)
Creatinine, Ser: 1.22 mg/dL — ABNORMAL HIGH (ref 0.50–1.10)
GFR calc Af Amer: 50 mL/min — ABNORMAL LOW (ref >90)
GFR calc non Af Amer: 43 mL/min — ABNORMAL LOW (ref >90)
Glucose, Bld: 111 mg/dL — ABNORMAL HIGH (ref 70–99)
Potassium: 3.7 mEq/L (ref 3.7–5.3)
Sodium: 139 mEq/L (ref 137–147)

## 2014-02-28 LAB — SODIUM, URINE, RANDOM: SODIUM UR: 56 meq/L

## 2014-02-28 LAB — CBC
HEMATOCRIT: 33.5 % — AB (ref 36.0–46.0)
Hemoglobin: 10.3 g/dL — ABNORMAL LOW (ref 12.0–15.0)
MCH: 28.9 pg (ref 26.0–34.0)
MCHC: 30.7 g/dL (ref 30.0–36.0)
MCV: 93.8 fL (ref 78.0–100.0)
Platelets: 186 10*3/uL (ref 150–400)
RBC: 3.57 MIL/uL — ABNORMAL LOW (ref 3.87–5.11)
RDW: 14.6 % (ref 11.5–15.5)
WBC: 4 10*3/uL (ref 4.0–10.5)

## 2014-02-28 LAB — LEGIONELLA ANTIGEN, URINE: LEGIONELLA ANTIGEN, URINE: NEGATIVE

## 2014-02-28 LAB — TROPONIN I: Troponin I: 0.34 ng/mL (ref <0.30)

## 2014-02-28 MED ORDER — LEVOFLOXACIN 750 MG PO TABS
750.0000 mg | ORAL_TABLET | Freq: Every day | ORAL | Status: DC
  Administered 2014-02-28 – 2014-03-08 (×9): 750 mg via ORAL
  Filled 2014-02-28 (×9): qty 1

## 2014-02-28 MED ORDER — IPRATROPIUM-ALBUTEROL 0.5-2.5 (3) MG/3ML IN SOLN
3.0000 mL | RESPIRATORY_TRACT | Status: DC
  Administered 2014-03-01 (×2): 3 mL via RESPIRATORY_TRACT
  Filled 2014-02-28 (×2): qty 3

## 2014-02-28 MED ORDER — FUROSEMIDE 10 MG/ML IJ SOLN
80.0000 mg | Freq: Two times a day (BID) | INTRAMUSCULAR | Status: DC
Start: 2014-02-28 — End: 2014-03-02
  Administered 2014-03-01 – 2014-03-02 (×3): 80 mg via INTRAVENOUS
  Filled 2014-02-28 (×4): qty 8

## 2014-02-28 NOTE — Progress Notes (Signed)
UR chart review completed.  

## 2014-02-28 NOTE — Progress Notes (Signed)
Patient's Troponin 0.34,Dr [place] notified.Will continue to monitor patient.

## 2014-02-28 NOTE — Progress Notes (Signed)
[facility] PROGRESS NOTE  [name] [name] JGG:[id number] DOB: 01-Aug-1941 DOA: 02/27/2014 PCP: [name], MD  Assessment/Plan: 1. Acute respiratory failure- secondary to pulmonary edema, patient does have a history of CHF. She was started on Lasix yesterday. Cardiology was consulted in the increase the Lasix to 80 mg IV every 12 hours. We'll continue to monitor the intake and output. 2. Pneumonia (healthcare associated pneumonia)- patient has been started on vancomycin and Levaquin for possible healthcare associated pneumonia, Legionella and urinary strep antigen are negative. 3. Elevated troponin- likely due to demand ischemia. Patient has a history of 3 cardiac catheterizations which revealed nonobstructive CAD and normal LV function. Continue to do her, metoprolol 25 mg by mouth daily Further management as per cardiology. 4. 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 Hyperkalemia- resolved after Kayexalate was given. 6. Acute kidney injury- today creatinine is down to 1.22, improved after diuresis. 7. Stage IV non-Hodgkin lymphoma- no further treatment as per patient 8. Chronic pain- patient treated with a fentanyl patch  Code Status: DNR Family Communication: *No family at bedside Disposition Plan: *Home when stable   Consultants:  Cardiology  Procedures:  None  Antibiotics:  *None  HPI/Subjective: 73 y.o. female with paraplegia who is wheelchair-bound, a history of stage IV non-Hodgkin's lymphoma no longer being treated but also not on hospice, hypertension, nonobstructive coronary artery disease, TIA, history of PE in the past and chronic pain.  The patient presents for shortness of breath which she states started this morning she's had a cough for a few weeks now but but for the past 4 days it has been productive of cream-colored sputum. Her daughter states that her mother had broken out into a sweat this morning and was found in the ER  to have a fever of 100.1. They also note that she has been gaining weight despite barely eating and her ankles are becoming more swollen. The daughter also states that the patient is being treated for a urinary tract infection and according to her medication list has received 7 days of IM Rocephin which ended yesterday. The patient admits to burning micturition. Other review of systems is as below.  I am admitting her for pulmonary edema and possible underlying pneumonia versus a bronchitis. She also has acute kidney failure and hyperkalemia. Today she says that she is breathing better.  Objective: Filed Vitals:   02/28/14 1015  BP: 103/55  Pulse: 78  Temp:   Resp:     Intake/Output Summary (Last 24 hours) at 02/28/14 1359 Last data filed at 02/28/14 0201  Gross per 24 hour  Intake      0 ml  Output      4 ml  Net     -4 ml   Filed Weights   02/27/14 1842  Weight: 114.306 kg (252 lb)    Exam:  Physical Exam: Head: Normocephalic, atraumatic.  Lungs: Bilateral rhonchi  Heart: Regular RR. S1 and S2 normal  Abdomen: BS normoactive. Soft, Nondistended, non-tender.  Extremities: No pretibial edema, no erythema   Data Reviewed: Basic Metabolic Panel:  Recent Labs Lab 02/27/14 1101 02/28/14 0438  NA 135* 139  K 5.8* 3.7  CL 101 101  CO2 23 27  GLUCOSE 159* 111*  BUN 24* 24*  CREATININE 1.30* 1.22*  CALCIUM 8.1* 8.2*   Liver Function Tests:  Recent Labs Lab 02/27/14 1101  AST 34  ALT 26  ALKPHOS 174*  BILITOT 0.2*  PROT 6.2  ALBUMIN 2.5*   No  results found for this basename: LIPASE, AMYLASE,  in the last 168 hours No results found for this basename: AMMONIA,  in the last 168 hours CBC:  Recent Labs Lab 02/27/14 1101 02/28/14 0438  WBC 6.0 4.0  NEUTROABS 4.5  --   HGB 11.1* 10.3*  HCT 36.0 33.5*  MCV 94.2 93.8  PLT 179 186   Cardiac Enzymes:  Recent Labs Lab 02/27/14 1101 02/27/14 1426 02/27/14 2157 02/28/14 0438 02/28/14 0949  TROPONINI  0.47* 0.43* <0.30 <0.30 0.34*   BNP (last 3 results)  Recent Labs  02/27/14 1101  PROBNP 7765.0*   CBG: No results found for this basename: GLUCAP,  in the last 168 hours  No results found for this or any previous visit (from the past 240 hour(s)).   Studies: Dg Chest Portable 1 View  02/27/2014   CLINICAL DATA:  Shortness of breath.  EXAM: PORTABLE CHEST - 1 VIEW  COMPARISON:  Portable mobile chest x-ray 02/20/2014. Portable chest 02/15/2014, 01/30/2014 [facility].  FINDINGS: Suboptimal inspiration due to body habitus accounts for crowded bronchovascular markings, especially in the bases, and accentuates the cardiac silhouette. Taking this into account, cardiac silhouette moderately enlarged but stable. Dense mitral annular calcification. Interval development of mild diffuse interstitial pulmonary edema. No confluent airspace consolidation. Right jugular Port-A-Cath tip projects over the mid to lower SVC.  IMPRESSION: Suboptimal inspiration. Mild CHF, with moderate cardiomegaly and mild diffuse interstitial pulmonary edema.   Electronically Signed   By: Evangeline Dakin M.D.   On: 02/27/2014 12:14    Scheduled Meds: . ALPRAZolam  0.5 mg Oral BID  . aspirin  81 mg Oral Daily  . buPROPion  150 mg Oral BID  . citalopram  20 mg Oral q morning - 10a  . docusate sodium  100 mg Oral BID  . enoxaparin  40 mg Subcutaneous Q24H  . fentaNYL  100 mcg Transdermal Q72H  . furosemide  80 mg Intravenous BID  . gabapentin  300 mg Oral TID  . guaiFENesin  600 mg Oral BID  . ipratropium-albuterol  3 mL Nebulization Q6H  . isosorbide mononitrate  30 mg Oral q morning - 10a  . [START ON 03/01/2014] levofloxacin (LEVAQUIN) IV  750 mg Intravenous Q48H  . levothyroxine  50 mcg Oral QAC breakfast  . metoprolol tartrate  25 mg Oral Daily  . pantoprazole  40 mg Oral Daily  . sodium chloride  3 mL Intravenous Q12H  . vancomycin  1,500 mg Intravenous Q24H   Continuous Infusions:    Principal Problem:   Acute respiratory failure Active Problems:   Essential hypertension, benign   Acute renal failure   Paraplegia following spinal cord injury   Hyperkalemia   Pulmonary edema    Time spent: *25 minutes    [place] Pager [phone]. If 7PM-7AM, please contact night-coverage at [email or web address], password [facility] 02/28/2014, 1:59 PM  LOS: 1 day

## 2014-02-28 NOTE — Progress Notes (Signed)
Patient's blood pressure was 84/47,asymptomatic,no c/o pain or discomfort,Dr [place] notified, will continue to monitor patient. Family at bedside.

## 2014-02-28 NOTE — Evaluation (Signed)
Clinical/Bedside Swallow Evaluation  Patient Details  Name: [name] [name] MRN: [id number] Date of Birth: 11-23-1940  Today's Date: 02/28/2014 Time: 4270-6237 SLP Time Calculation (min): 32 min  Past Medical History:  Past Medical History  Diagnosis Date  . Non Hodgkin's lymphoma   . Other chronic pain   . Esophageal reflux   . Unspecified essential hypertension   . Personal history of pulmonary embolism   . Coronary atherosclerosis of native coronary artery     CATHETERIZATION X 3  . Dysthymic disorder   . Morbid obesity   . Fibromyalgia   . GERD (gastroesophageal reflux disease)   . Anxiety   . Myocardial infarction   . TIA (transient ischemic attack)   . Complication of anesthesia   . PONV (postoperative nausea and vomiting)    Past Surgical History:  Past Surgical History  Procedure Laterality Date  . Back surgery      X 2 FOR DISK PROBLEMS. three total back surgery  . Abdominal hysterectomy    . Gastric bypass      HISTORY OF  . Carpal tunnel release      bilateral  . Bladder suspension    . Cholecystectomy    . Colonoscopy, esophagogastroduodenoscopy (egd) and esophageal dilation N/A 01/23/2013    SEG:BTDVVOHY hemorrhoids otherwise normal colonoscopy/Mild changes of esophagitis at GE junction(focal erythema).Small gastric pouch with patent gastrojejunostomy.Normal mucosa of proximal small bowel  . Givens capsule study N/A 01/24/2013    Procedure: GIVENS CAPSULE STUDY;  Surgeon: Rogene Houston, MD;  [facility];  Service: Endoscopy;  Laterality: N/A;  . Hip arthroplasty Right 08/29/2013    Procedure: HEMI ARTHROPLASTY right hip;  Surgeon: Mauri Pole, MD;  Location: [facility];  Service: Orthopedics;  Laterality: Right;   HPI:  This is a 73 year old female paraplegic who is wheelchair bound, has a history of stage IV non-Hodgkin's lymphoma and is no longer being treated but is also not on hospice, lung CA. She presented with shortness of breath and  productive sputum. She was found to be in pulmonary edema and respiratory failure as well as acute kidney failure and hyperkalemia.BNP 7765, Troponin .47 and .34. She states that she has had worsening edema and fluid buildup for several months. She is taken care of by her husband and niece. She denies any chest pain, palpitations, dizziness or presyncope. She has been in and out of [facility] this spring.    Assessment / Plan / Recommendation Clinical Impression  [name] shows no overt signs or symptoms of aspiration at bedside, however she sound very congested. SLP will follow up tomorrow AM to determine if MBSS is indicated. Husband reports that she  had pneumonia in January. He is wondering if she should be on oxygen at home. SLP encouraged him to discuss that with the doctor. Pt/husband appreciative of SLP visit.    Aspiration Risk  Mild    Diet Recommendation Dysphagia 3 (Mechanical Soft);Thin liquid   Liquid Administration via: Cup;Straw Medication Administration: Whole meds with liquid (break large pills per pt preference) Supervision: Intermittent supervision to cue for compensatory strategies Postural Changes and/or Swallow Maneuvers: Seated upright 90 degrees;Upright 30-60 min after meal    Other  Recommendations Oral Care Recommendations: Oral care BID Other Recommendations: Clarify dietary restrictions   Follow Up Recommendations  None    Frequency and Duration min 2x/week  2 weeks   Pertinent Vitals/Pain VSS on 2L O2    SLP Swallow Goals   Pt will  demonstrate safe and efficient consumption of least restrictive diet with use of strategies as needed.   Swallow Study Prior Functional Status   Pt lives at home with husband and eats most of her meals in bed; s/p SCI with paraplegia    General Date of Onset: 02/27/14 HPI: This is a 73 year old female paraplegic who is wheelchair bound, has a history of stage IV non-Hodgkin's lymphoma and is no longer being treated  but is also not on hospice, lung CA. She presented with shortness of breath and productive sputum. She was found to be in pulmonary edema and respiratory failure as well as acute kidney failure and hyperkalemia.BNP 7765, Troponin .47 and .34. She states that she has had worsening edema and fluid buildup for several months. She is taken care of by her husband and niece. She denies any chest pain, palpitations, dizziness or presyncope. She has been in and out of [facility] this spring.  Type of Study: Bedside swallow evaluation Previous Swallow Assessment: none on record Diet Prior to this Study: Regular;Thin liquids Temperature Spikes Noted: yes, upon admission Respiratory Status: Nasal cannula History of Recent Intubation: No Behavior/Cognition: Alert;Cooperative;Pleasant mood Oral Cavity - Dentition: Dentures, top;Dentures, bottom Self-Feeding Abilities: Able to feed self Patient Positioning: Upright in bed Baseline Vocal Quality: Clear Volitional Cough: Strong;Congested Volitional Swallow: Able to elicit    Oral/Motor/Sensory Function Overall Oral Motor/Sensory Function: Appears within functional limits for tasks assessed   Ice Chips Ice chips: Not tested   Thin Liquid Thin Liquid: Within functional limits Presentation: Self Fed;Straw    Nectar Thick Nectar Thick Liquid: Not tested   Honey Thick Honey Thick Liquid: Not tested   Puree Puree: Within functional limits Presentation: Self Fed;Spoon   Solid   Thank you,  Genene Churn, CCC-SLP [phone]     Solid: Within functional limits Presentation: Self Fed       PORTER,DABNEY 02/28/2014,3:47 PM

## 2014-02-28 NOTE — Consult Note (Signed)
Reason for Consult:pulmonary edema Referring Physician: PTH  HADLEE BURBACK is an 73 y.o. female.  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   She presented with shortness of breath and productive sputum. She was found to be in pulmonary edema and respiratory failure as well as acute kidney failure and hyperkalemia.BNP 7765, Troponin .47 and .34. She states that she has had worsening edema and fluid buildup for several months. She is taken care of by her husband and niece. She denies any chest pain, palpitations, dizziness or presyncope. She has been in and out of [facility] this spring.    The patient has a long history of chest pain with false positive Cardiolite study and nonobstructive CAD on 3 separate cardiac catheterizations, last one in 2008. She has a history of TIAs previously treated with Coumadin.She has history of diabetes mellitus type 2, morbid obesity.She is a former patient of Dr. Dannielle Burn and was to see Dr. Harl Bowie in 08/2013, but cancelled that appointment.     Past Medical History  Diagnosis Date  . Non Hodgkin's lymphoma   . Other chronic pain   . Esophageal reflux   . Unspecified essential hypertension   . Personal history of pulmonary embolism   . Coronary atherosclerosis of native coronary artery     CATHETERIZATION X 3  . Dysthymic disorder   . Morbid obesity   . Fibromyalgia   . GERD (gastroesophageal reflux disease)   . Anxiety   . Myocardial infarction   . TIA (transient ischemic attack)   . Complication of anesthesia   . PONV (postoperative nausea and vomiting)     Past Surgical History  Procedure Laterality Date  . Back surgery      X 2 FOR DISK PROBLEMS. three total back surgery  . Abdominal hysterectomy    . Gastric bypass      HISTORY OF  . Carpal tunnel release      bilateral  . Bladder suspension     . Cholecystectomy    . Colonoscopy, esophagogastroduodenoscopy (egd) and esophageal dilation N/A 01/23/2013    BLT:[id number] hemorrhoids otherwise normal colonoscopy/Mild changes of esophagitis at GE junction(focal erythema).Small gastric pouch with patent gastrojejunostomy.Normal mucosa of proximal small bowel  . Givens capsule study N/A 01/24/2013    Procedure: GIVENS CAPSULE STUDY;  Surgeon: Rogene Houston, MD;  [facility];  Service: Endoscopy;  Laterality: N/A;  . Hip arthroplasty Right 08/29/2013    Procedure: HEMI ARTHROPLASTY right hip;  Surgeon: Mauri Pole, MD;  Location: [facility];  Service: Orthopedics;  Laterality: Right;    Family History  Problem Relation Age of Onset  . Coronary artery disease      FAMILY HISTORY  . Diabetes type II Mother   . Heart disease Mother   . Lymphoma Father   . Colon cancer Sister     late 77s    Social History:  reports that she has never smoked. She has never used smokeless tobacco. She reports that she does not drink alcohol or use illicit drugs.  Allergies:  Allergies  Allergen Reactions  . Bee Venom Hives  . Codeine Palpitations    Per patient, "Feels like I'm having a heart attack."    Medications:  Scheduled Meds: . ALPRAZolam  0.5 mg Oral BID  . aspirin  81 mg Oral Daily  .  buPROPion  150 mg Oral BID  . citalopram  20 mg Oral q morning - 10a  . docusate sodium  100 mg Oral BID  . enoxaparin  40 mg Subcutaneous Q24H  . fentaNYL  100 mcg Transdermal Q72H  . furosemide  40 mg Intravenous Daily  . gabapentin  300 mg Oral TID  . guaiFENesin  600 mg Oral BID  . ipratropium-albuterol  3 mL Nebulization Q6H  . isosorbide mononitrate  30 mg Oral q morning - 10a  . [START ON 03/01/2014] levofloxacin (LEVAQUIN) IV  750 mg Intravenous Q48H  . levothyroxine  50 mcg Oral QAC breakfast  . metoprolol tartrate  25 mg Oral Daily  . pantoprazole  40 mg Oral Daily  . sodium chloride  3 mL Intravenous Q12H  . vancomycin  1,500  mg Intravenous Q24H   Continuous Infusions:  PRN Meds:.sodium chloride, acetaminophen, acetaminophen, albuterol, alum & mag hydroxide-simeth, LORazepam, ondansetron (ZOFRAN) IV, ondansetron, sodium chloride   Results for orders placed during the hospital encounter of 02/27/14 (from the past 48 hour(s))  CBC WITH DIFFERENTIAL     Status: Abnormal   Collection Time    02/27/14 11:01 AM      Result Value Ref Range   WBC 6.0  4.0 - 10.5 K/uL   RBC 3.82 (*) 3.87 - 5.11 MIL/uL   Hemoglobin 11.1 (*) 12.0 - 15.0 g/dL   HCT 36.0  36.0 - 46.0 %   MCV 94.2  78.0 - 100.0 fL   MCH 29.1  26.0 - 34.0 pg   MCHC 30.8  30.0 - 36.0 g/dL   RDW 15.0  11.5 - 15.5 %   Platelets 179  150 - 400 K/uL   Neutrophils Relative % 75  43 - 77 %   Neutro Abs 4.5  1.7 - 7.7 K/uL   Lymphocytes Relative 14  12 - 46 %   Lymphs Abs 0.8  0.7 - 4.0 K/uL   Monocytes Relative 11  3 - 12 %   Monocytes Absolute 0.7  0.1 - 1.0 K/uL   Eosinophils Relative 0  0 - 5 %   Eosinophils Absolute 0.0  0.0 - 0.7 K/uL   Basophils Relative 0  0 - 1 %   Basophils Absolute 0.0  0.0 - 0.1 K/uL  COMPREHENSIVE METABOLIC PANEL     Status: Abnormal   Collection Time    02/27/14 11:01 AM      Result Value Ref Range   Sodium 135 (*) 137 - 147 mEq/L   Potassium 5.8 (*) 3.7 - 5.3 mEq/L   Chloride 101  96 - 112 mEq/L   CO2 23  19 - 32 mEq/L   Glucose, Bld 159 (*) 70 - 99 mg/dL   BUN 24 (*) 6 - 23 mg/dL   Creatinine, Ser 1.30 (*) 0.50 - 1.10 mg/dL   Calcium 8.1 (*) 8.4 - 10.5 mg/dL   Total Protein 6.2  6.0 - 8.3 g/dL   Albumin 2.5 (*) 3.5 - 5.2 g/dL   AST 34  0 - 37 U/L   ALT 26  0 - 35 U/L   Alkaline Phosphatase 174 (*) 39 - 117 U/L   Total Bilirubin 0.2 (*) 0.3 - 1.2 mg/dL   GFR calc non Af Amer 40 (*) >90 mL/min   GFR calc Af Amer 46 (*) >90 mL/min   Comment: (NOTE)     The eGFR has been calculated using the CKD EPI equation.     This calculation has not  been validated in all clinical situations.     eGFR's persistently <90  mL/min signify possible Chronic Kidney     Disease.   Anion gap 11  5 - 15  TROPONIN I     Status: Abnormal   Collection Time    02/27/14 11:01 AM      Result Value Ref Range   Troponin I 0.47 (*) <0.30 ng/mL   Comment:            Due to the release kinetics of cTnI,     a negative result within the first hours     of the onset of symptoms does not rule out     myocardial infarction with certainty.     If myocardial infarction is still suspected,     repeat the test at appropriate intervals.     CRITICAL RESULT CALLED TO, READ BACK BY AND VERIFIED WITH:     KEITH,J AT 1145 BY GODFREY,O ON 02/27/14  PRO B NATRIURETIC PEPTIDE     Status: Abnormal   Collection Time    02/27/14 11:01 AM      Result Value Ref Range   Pro B Natriuretic peptide (BNP) 7765.0 (*) 0 - 125 pg/mL  TROPONIN I     Status: Abnormal   Collection Time    02/27/14  2:26 PM      Result Value Ref Range   Troponin I 0.43 (*) <0.30 ng/mL   Comment:            Due to the release kinetics of cTnI,     a negative result within the first hours     of the onset of symptoms does not rule out     myocardial infarction with certainty.     If myocardial infarction is still suspected,     repeat the test at appropriate intervals.     CRITICAL RESULT CALLED TO, READ BACK BY AND VERIFIED WITH:     VOGLEAR,T AT 1507 BY GODFREY,O ON 02/27/14  URINALYSIS, ROUTINE W REFLEX MICROSCOPIC     Status: Abnormal   Collection Time    02/27/14  3:40 PM      Result Value Ref Range   Color, Urine YELLOW  YELLOW   APPearance CLOUDY (*) CLEAR   Specific Gravity, Urine 1.020  1.005 - 1.030   pH 5.0  5.0 - 8.0   Glucose, UA NEGATIVE  NEGATIVE mg/dL   Hgb urine dipstick NEGATIVE  NEGATIVE   Bilirubin Urine NEGATIVE  NEGATIVE   Ketones, ur NEGATIVE  NEGATIVE mg/dL   Protein, ur NEGATIVE  NEGATIVE mg/dL   Urobilinogen, UA 0.2  0.0 - 1.0 mg/dL   Nitrite NEGATIVE  NEGATIVE   Leukocytes, UA SMALL (*) NEGATIVE  STREP PNEUMONIAE URINARY  ANTIGEN     Status: None   Collection Time    02/27/14  3:40 PM      Result Value Ref Range   Strep Pneumo Urinary Antigen NEGATIVE  NEGATIVE   Comment: PERFORMED AT [facility]                Infection due to S. pneumoniae     cannot be absolutely ruled out     since the antigen present     may be below the detection limit     of the test.     Performed at [place], URINE     Status: None   Collection Time    02/27/14  3:40 PM      Result Value Ref Range   Specimen Description URINE, CLEAN CATCH     Special Requests NONE     Legionella Antigen, Urine       Value: Negative for Legionella pneumophilia serogroup 1     Performed at [facility]   Report Status 02/28/2014 FINAL    URINE MICROSCOPIC-ADD ON     Status: Abnormal   Collection Time    02/27/14  3:40 PM      Result Value Ref Range   Squamous Epithelial / LPF FEW (*) RARE   WBC, UA 11-20  <3 WBC/hpf   Bacteria, UA FEW (*) RARE  TROPONIN I     Status: None   Collection Time    02/27/14  9:57 PM      Result Value Ref Range   Troponin I <0.30  <0.30 ng/mL   Comment:            Due to the release kinetics of cTnI,     a negative result within the first hours     of the onset of symptoms does not rule out     myocardial infarction with certainty.     If myocardial infarction is still suspected,     repeat the test at appropriate intervals.  TROPONIN I     Status: None   Collection Time    02/28/14  4:38 AM      Result Value Ref Range   Troponin I <0.30  <0.30 ng/mL   Comment:            Due to the release kinetics of cTnI,     a negative result within the first hours     of the onset of symptoms does not rule out     myocardial infarction with certainty.     If myocardial infarction is still suspected,     repeat the test at appropriate intervals.  BASIC METABOLIC PANEL     Status: Abnormal   Collection Time    02/28/14  4:38 AM      Result Value Ref Range    Sodium 139  137 - 147 mEq/L   Potassium 3.7  3.7 - 5.3 mEq/L   Comment: DELTA CHECK NOTED   Chloride 101  96 - 112 mEq/L   CO2 27  19 - 32 mEq/L   Glucose, Bld 111 (*) 70 - 99 mg/dL   BUN 24 (*) 6 - 23 mg/dL   Creatinine, Ser 1.22 (*) 0.50 - 1.10 mg/dL   Calcium 8.2 (*) 8.4 - 10.5 mg/dL   GFR calc non Af Amer 43 (*) >90 mL/min   GFR calc Af Amer 50 (*) >90 mL/min   Comment: (NOTE)     The eGFR has been calculated using the CKD EPI equation.     This calculation has not been validated in all clinical situations.     eGFR's persistently <90 mL/min signify possible Chronic Kidney     Disease.   Anion gap 11  5 - 15  CBC     Status: Abnormal   Collection Time    02/28/14  4:38 AM      Result Value Ref Range   WBC 4.0  4.0 - 10.5 K/uL   RBC 3.57 (*) 3.87 - 5.11 MIL/uL   Hemoglobin 10.3 (*) 12.0 - 15.0 g/dL   HCT 33.5 (*) 36.0 - 46.0 %   MCV 93.8  78.0 - 100.0 fL  MCH 28.9  26.0 - 34.0 pg   MCHC 30.7  30.0 - 36.0 g/dL   RDW 14.6  11.5 - 15.5 %   Platelets 186  150 - 400 K/uL  TROPONIN I     Status: Abnormal   Collection Time    02/28/14  9:49 AM      Result Value Ref Range   Troponin I 0.34 (*) <0.30 ng/mL   Comment: CRITICAL RESULT CALLED TO, READ BACK BY AND VERIFIED WITH:     DILDY,V AT 10:23AM ON 02/28/14 BY FESTERMAN,C                Due to the release kinetics of cTnI,     a negative result within the first hours     of the onset of symptoms does not rule out     myocardial infarction with certainty.     If myocardial infarction is still suspected,     repeat the test at appropriate intervals.    Dg Chest Portable 1 View  02/27/2014   CLINICAL DATA:  Shortness of breath.  EXAM: PORTABLE CHEST - 1 VIEW  COMPARISON:  Portable mobile chest x-ray 02/20/2014. Portable chest 02/15/2014, 01/30/2014 [facility].  FINDINGS: Suboptimal inspiration due to body habitus accounts for crowded bronchovascular markings, especially in the bases, and accentuates the  cardiac silhouette. Taking this into account, cardiac silhouette moderately enlarged but stable. Dense mitral annular calcification. Interval development of mild diffuse interstitial pulmonary edema. No confluent airspace consolidation. Right jugular Port-A-Cath tip projects over the mid to lower SVC.  IMPRESSION: Suboptimal inspiration. Mild CHF, with moderate cardiomegaly and mild diffuse interstitial pulmonary edema.   Electronically Signed   By: Evangeline Dakin M.D.   On: 02/27/2014 12:14    ROS See HPI Eyes: Negative Ears:Negative for hearing loss, tinnitus Cardiovascular: Negative for chest pain, palpitations,irregular heartbeat, near-syncope, orthopnea, paroxysmal nocturnal dyspnea and syncope, claudication, cyanosis,.  Respiratory: positive for cough, hemoptysis, shortness of breath, sleep disturbances due to breathing, sputum production and wheezing.   Endocrine: Negative for cold intolerance and heat intolerance.  Hematologic/Lymphatic: Negative for adenopathy and bleeding problem. Does  bruise/bleed easily.  Musculoskeletal: paraplegic  Gastrointestinal: Negative for nausea, vomiting, reflux, abdominal pain, diarrhea, constipation.   Genitourinary: Negative for bladder incontinence, dysuria, flank pain, frequency, hematuria, hesitancy, nocturia and urgency.  Neurological: Negative.  Allergic/Immunologic: Negative for environmental allergies.  LEFT VENTRICULOGRAM:  The left ventriculogram was obtained in the RAO  projection.  The EF was 65%.    CONCLUSION:  Minimal coronary plaque.  Normal left ventricular function.  There did appear to be left ventricular outflow gradient.  However,  there was significant ventricular ectopy.  This needs to be put into  clinical context, as there does not appear to be significant valvular  abnormality or other outflow obstruction per Dr. Arlina Robes note.    PLAN:  The patient will follow up Dr. Dannielle Burn in about 10 days.  We will  continue to have  the patient follow up with Dr. Sherrie Sport for evaluation  of non-anginal chest pain.      Minus Breeding, MD, [facility]  Electronically Signed     JH/MEDQ  D:  10/22/2006  T:  10/24/2006  Job:  [id number]      Blood pressure 103/55, pulse 78, temperature 97.9 F (36.6 C), temperature source Oral, resp. rate 18, height 5' 7" (1.702 m), weight 252 lb (114.306 kg), SpO2 97.00%. Physical Exam PHYSICAL EXAM: Obese, in no acute distress. Neck: No JVD, HJR,  Bruit, or thyroid enlargement Lungs: Decreased breath sounds with rales throughout scattered wheezing and rhonchi Cardiovascular: RRR, PMI not displaced, heart sounds distant, no murmurs, gallops, bruit, thrill, or heave. Abdomen: BS normal. Anasarca. Extremities: Severe edema all way up her abdomen decreased distal pulses SKin: Warm, no lesions or rashes  Musculoskeletal: Paraplegic Neuro: no focal signs  LEFT VENTRICULOGRAM:  The left ventriculogram was obtained in the RAO  projection.  The EF was 65%.    CONCLUSION:  Minimal coronary plaque.  Normal left ventricular function.  There did appear to be left ventricular outflow gradient.  However,  there was significant ventricular ectopy.  This needs to be put into  clinical context, as there does not appear to be significant valvular  abnormality or other outflow obstruction per Dr. Arlina Robes note.    PLAN:  The patient will follow up Dr. Dannielle Burn in about 10 days.  We will  continue to have the patient follow up with Dr. Sherrie Sport for evaluation  of non-anginal chest pain.          Minus Breeding, MD, [facility]  Electronically Signed        JH/MEDQ  D:  10/22/2006  T:  10/24/2006  Job:  [id number]    Assessment/Plan: Acute pulmonary edema with anasarca: Needs significant diuresis. Increase Lasix to 80 mg IV twice a day. Check 2-D echo for LV function. Potassium 5.8  Non-Hodgkin's lymphoma  History of lung cancer treated with chemotherapy  History of TIAs previously on  Coumadin  History of pulmonary embolus   Hypertension  Hold meds since bp low while diuresing    History of chest pain with 3 cardiac catheterizations revealing nonobstructive CAD and normal LV function last one in 2008.  Chronic renal insufficiency  Follow response.    paraplegic  Ermalinda Barrios 02/28/2014, 10:56 AM   Patient seen and examined  Agree with findings of M LEnze above  I have amended note to reflect my findings   Patient with marked volume overload on exam.  I would increase lasix as she has noted.   Would get USN   Will continue to follow.  Dorris Carnes

## 2014-03-01 DIAGNOSIS — J810 Acute pulmonary edema: Secondary | ICD-10-CM

## 2014-03-01 DIAGNOSIS — G8220 Paraplegia, unspecified: Secondary | ICD-10-CM

## 2014-03-01 DIAGNOSIS — R609 Edema, unspecified: Secondary | ICD-10-CM

## 2014-03-01 DIAGNOSIS — IMO0002 Reserved for concepts with insufficient information to code with codable children: Secondary | ICD-10-CM

## 2014-03-01 DIAGNOSIS — C8190 Hodgkin lymphoma, unspecified, unspecified site: Secondary | ICD-10-CM

## 2014-03-01 DIAGNOSIS — I059 Rheumatic mitral valve disease, unspecified: Secondary | ICD-10-CM

## 2014-03-01 LAB — BASIC METABOLIC PANEL
Anion gap: 9 (ref 5–15)
BUN: 25 mg/dL — ABNORMAL HIGH (ref 6–23)
CALCIUM: 7.7 mg/dL — AB (ref 8.4–10.5)
CHLORIDE: 101 meq/L (ref 96–112)
CO2: 27 meq/L (ref 19–32)
Creatinine, Ser: 1.3 mg/dL — ABNORMAL HIGH (ref 0.50–1.10)
GFR calc Af Amer: 46 mL/min — ABNORMAL LOW (ref >90)
GFR calc non Af Amer: 40 mL/min — ABNORMAL LOW (ref >90)
GLUCOSE: 260 mg/dL — AB (ref 70–99)
POTASSIUM: 3.9 meq/L (ref 3.7–5.3)
SODIUM: 137 meq/L (ref 137–147)

## 2014-03-01 LAB — GLUCOSE, CAPILLARY: GLUCOSE-CAPILLARY: 107 mg/dL — AB (ref 70–99)

## 2014-03-01 MED ORDER — ENOXAPARIN SODIUM 60 MG/0.6ML ~~LOC~~ SOLN
60.0000 mg | SUBCUTANEOUS | Status: DC
  Administered 2014-03-01 – 2014-03-07 (×7): 60 mg via SUBCUTANEOUS
  Filled 2014-03-01 (×7): qty 0.6

## 2014-03-01 MED ORDER — ENOXAPARIN SODIUM 40 MG/0.4ML ~~LOC~~ SOLN: 60.0000 mg | SUBCUTANEOUS | Status: DC

## 2014-03-01 MED ORDER — METHYLPREDNISOLONE SODIUM SUCC 125 MG IJ SOLR
60.0000 mg | Freq: Four times a day (QID) | INTRAMUSCULAR | Status: DC
  Administered 2014-03-01 – 2014-03-04 (×13): 60 mg via INTRAVENOUS
  Filled 2014-03-01 (×13): qty 2

## 2014-03-01 MED ORDER — IPRATROPIUM-ALBUTEROL 0.5-2.5 (3) MG/3ML IN SOLN
3.0000 mL | RESPIRATORY_TRACT | Status: DC
  Administered 2014-03-01 – 2014-03-03 (×10): 3 mL via RESPIRATORY_TRACT
  Filled 2014-03-01 (×10): qty 3

## 2014-03-01 NOTE — Progress Notes (Signed)
  Echocardiogram 2D Echocardiogram has been performed.  [place], [place] 03/01/2014, 11:59 AM

## 2014-03-01 NOTE — Progress Notes (Signed)
Subjective: Still SOB  No Change  No CP   Objective: Filed Vitals:   02/28/14 2350 03/01/14 0427 03/01/14 0502 03/01/14 0656  BP: 96/45  90/39   Pulse: 76  79   Temp: 98 F (36.7 C)  98.2 F (36.8 C)   TempSrc: Oral  Oral   Resp: 18  18   Height:      Weight:      SpO2: 96% 96% 98% 95%   Weight change:   Intake/Output Summary (Last 24 hours) at 03/01/14 0827 Last data filed at 02/28/14 2000  Gross per 24 hour  Intake    620 ml  Output    950 ml  Net   -330 ml   Net neg -414  General: Alert, awake, oriented x3, in no acute distress Neck:  JVP is difficult to assess   Heart: Regular rate and rhythm, without murmurs, rubs, gallops.  Lungs: Diffuse rhonchi   Exemities:  2+ edema.   Neuro: Grossly intact, nonfocal.  TEle:  SR Lab Results: Results for orders placed during the hospital encounter of 02/27/14 (from the past 24 hour(s))  TROPONIN I     Status: Abnormal   Collection Time    02/28/14  9:49 AM      Result Value Ref Range   Troponin I 0.34 (*) <0.30 ng/mL  SODIUM, URINE, RANDOM     Status: None   Collection Time    02/28/14 11:05 AM      Result Value Ref Range   Sodium, Ur 56    GLUCOSE, CAPILLARY     Status: Abnormal   Collection Time    03/01/14  4:58 AM      Result Value Ref Range   Glucose-Capillary 107 (*) 70 - 99 mg/dL   Comment 1 Notify RN      Studies/Results: No results found.  Medications:  Reviewed   @PROBHOSP @  1.  Anasarca.  patinet has not had a signif response to lasix  But  I would recomm foley catheter to confirm/quantitate    Echo pending  2.  HTN  Follow  3  Renal  Follow with diuresis.    LOS: 2 days   Dorris Carnes 03/01/2014, 8:27 AM

## 2014-03-01 NOTE — Progress Notes (Addendum)
[facility] PROGRESS NOTE  [name] [name] YQM:[id number] DOB: 1940-11-16 DOA: 02/27/2014 PCP: [name], MD  Assessment/Plan: 1. Acute respiratory failure- secondary to pulmonary edema, patient does have a history of CHF. She was started on Lasix yesterday. Cardiology was consulted in the increased the Lasix to 80 mg IV every 12 hours. We'll continue to monitor the intake and output. 2. Pneumonia (healthcare associated pneumonia)- patient has been started on vancomycin and Levaquin for possible healthcare associated pneumonia, Legionella and urinary strep antigen are negative. Will discontinue vancomycin. Continue with Levaquin. 3. Acute Bronchitis- Start Solumedrol 60 mg IV q 6 hrs, duoneb nebulizer q 4 hrs scheduled.Mucinex 1200 mg po BID 4. Elevated troponin- likely due to demand ischemia. Patient has a history of 3 cardiac catheterizations which revealed nonobstructive CAD and normal LV function. Continue to do her, metoprolol 25 mg by mouth daily Further management as per cardiology. 5. 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 Hyperkalemia- resolved after Kayexalate was given. 7. Acute kidney injury- today creatinine is down to 1.22, improved after diuresis. 8. Stage IV non-Hodgkin lymphoma- no further treatment as per patient 9. Chronic pain- patient treated with a fentanyl patch   Code Status: DNR Family Communication: *Called and discussed with daughter on phone Disposition Plan: *Home when stable   Consultants:  Cardiology  Procedures:  None  Antibiotics:  *None  HPI/Subjective: 73 y.o. female with paraplegia who is wheelchair-bound, a history of stage IV non-Hodgkin's lymphoma no longer being treated but also not on hospice, hypertension, nonobstructive coronary artery disease, TIA, history of PE in the past and chronic pain.  The patient presents for shortness of breath which she states started this morning she's had a cough for  a few weeks now but but for the past 4 days it has been productive of cream-colored sputum. Her daughter states that her mother had broken out into a sweat this morning and was found in the ER to have a fever of 100.1. They also note that she has been gaining weight despite barely eating and her ankles are becoming more swollen. The daughter also states that the patient is being treated for a urinary tract infection and according to her medication list has received 7 days of IM Rocephin which ended yesterday. The patient admits to burning micturition. Other review of systems is as below.  I am admitting her for pulmonary edema and possible underlying pneumonia versus a bronchitis. She also has acute kidney failure and hyperkalemia. Not much diuresis with lasix, continues to complain of cough.  Objective: Filed Vitals:   03/01/14 0502  BP: 90/39  Pulse: 79  Temp: 98.2 F (36.8 C)  Resp: 18    Intake/Output Summary (Last 24 hours) at 03/01/14 1346 Last data filed at 03/01/14 1122  Gross per 24 hour  Intake    980 ml  Output    850 ml  Net    130 ml   Filed Weights   02/27/14 1842  Weight: 114.306 kg (252 lb)    Exam:  Physical Exam: Head: Normocephalic, atraumatic.  Lungs: Bilateral rhonchi  Heart: Regular RR. S1 and S2 normal  Abdomen: BS normoactive. Soft, Nondistended, non-tender.  Extremities: Bilateral 2+ edema   Data Reviewed: Basic Metabolic Panel:  Recent Labs Lab 02/27/14 1101 02/28/14 0438  NA 135* 139  K 5.8* 3.7  CL 101 101  CO2 23 27  GLUCOSE 159* 111*  BUN 24* 24*  CREATININE 1.30* 1.22*  CALCIUM 8.1* 8.2*   Liver  Function Tests:  Recent Labs Lab 02/27/14 1101  AST 34  ALT 26  ALKPHOS 174*  BILITOT 0.2*  PROT 6.2  ALBUMIN 2.5*   No results found for this basename: LIPASE, AMYLASE,  in the last 168 hours No results found for this basename: AMMONIA,  in the last 168 hours CBC:  Recent Labs Lab 02/27/14 1101 02/28/14 0438  WBC 6.0 4.0   NEUTROABS 4.5  --   HGB 11.1* 10.3*  HCT 36.0 33.5*  MCV 94.2 93.8  PLT 179 186   Cardiac Enzymes:  Recent Labs Lab 02/27/14 1101 02/27/14 1426 02/27/14 2157 02/28/14 0438 02/28/14 0949  TROPONINI 0.47* 0.43* <0.30 <0.30 0.34*   BNP (last 3 results)  Recent Labs  02/27/14 1101  PROBNP 7765.0*   CBG:  Recent Labs Lab 03/01/14 0458  GLUCAP 107*    No results found for this or any previous visit (from the past 240 hour(s)).   Studies: No results found.  Scheduled Meds: . ALPRAZolam  0.5 mg Oral BID  . aspirin  81 mg Oral Daily  . buPROPion  150 mg Oral BID  . citalopram  20 mg Oral q morning - 10a  . docusate sodium  100 mg Oral BID  . enoxaparin  40 mg Subcutaneous Q24H  . fentaNYL  100 mcg Transdermal Q72H  . furosemide  80 mg Intravenous BID  . gabapentin  300 mg Oral TID  . guaiFENesin  600 mg Oral BID  . ipratropium-albuterol  3 mL Nebulization Q4H  . levofloxacin  750 mg Oral q1800  . levothyroxine  50 mcg Oral QAC breakfast  . methylPREDNISolone (SOLU-MEDROL) injection  60 mg Intravenous Q6H  . pantoprazole  40 mg Oral Daily  . sodium chloride  3 mL Intravenous Q12H  . vancomycin  1,500 mg Intravenous Q24H   Continuous Infusions:   Principal Problem:   Acute respiratory failure Active Problems:   Essential hypertension, benign   Acute renal failure   Paraplegia following spinal cord injury   Hyperkalemia   Pulmonary edema    Time spent: *25 minutes    [facility] Pager [phone]. If 7PM-7AM, please contact night-coverage at [email or web address], password [facility] 03/01/2014, 1:46 PM  LOS: 2 days

## 2014-03-01 NOTE — Progress Notes (Signed)
ANTIBIOTIC CONSULT NOTE - follow up  Pharmacy Consult for Vancomycin & Levaquin  Indication: pneumonia  Allergies  Allergen Reactions  . Bee Venom Hives  . Codeine Palpitations    Per patient, "Feels like I'm having a heart attack."   Patient Measurements: Height: 5\' 7"  (170.2 cm) Weight: 252 lb (114.306 kg) IBW/kg (Calculated) : 61.6  Vital Signs: Temp: 98.2 F (36.8 C) (07/21 0502) Temp src: Oral (07/21 0502) BP: 90/39 mmHg (07/21 0502) Pulse Rate: 79 (07/21 0502) Intake/Output from previous day: 07/20 0701 - 07/21 0700 In: 740 [P.O.:240; IV Piggyback:500] Out: 950 [Urine:950] Intake/Output from this shift: Total I/O In: 360 [P.O.:360] Out: 100 [Urine:100]  Labs:  Recent Labs  02/27/14 1101 02/28/14 0438  WBC 6.0 4.0  HGB 11.1* 10.3*  PLT 179 186  CREATININE 1.30* 1.22*   Estimated Creatinine Clearance: 54.4 ml/min (by C-G formula based on Cr of 1.22). No results found for this basename: VANCOTROUGH, VANCOPEAK, VANCORANDOM, GENTTROUGH, GENTPEAK, GENTRANDOM, TOBRATROUGH, TOBRAPEAK, TOBRARND, AMIKACINPEAK, AMIKACINTROU, AMIKACIN,  in the last 72 hours   Microbiology: No results found for this or any previous visit (from the past 720 hour(s)).  Medical History: Past Medical History  Diagnosis Date  . Non Hodgkin's lymphoma   . Other chronic pain   . Esophageal reflux   . Unspecified essential hypertension   . Personal history of pulmonary embolism   . Coronary atherosclerosis of native coronary artery     CATHETERIZATION X 3  . Dysthymic disorder   . Morbid obesity   . Fibromyalgia   . GERD (gastroesophageal reflux disease)   . Anxiety   . Myocardial infarction   . TIA (transient ischemic attack)   . Complication of anesthesia   . PONV (postoperative nausea and vomiting)    Medications:  Scheduled:  . ALPRAZolam  0.5 mg Oral BID  . aspirin  81 mg Oral Daily  . buPROPion  150 mg Oral BID  . citalopram  20 mg Oral q morning - 10a  . docusate  sodium  100 mg Oral BID  . enoxaparin  40 mg Subcutaneous Q24H  . fentaNYL  100 mcg Transdermal Q72H  . furosemide  80 mg Intravenous BID  . gabapentin  300 mg Oral TID  . guaiFENesin  600 mg Oral BID  . ipratropium-albuterol  3 mL Nebulization Q4H  . levofloxacin  750 mg Oral q1800  . levothyroxine  50 mcg Oral QAC breakfast  . methylPREDNISolone (SOLU-MEDROL) injection  60 mg Intravenous Q6H  . pantoprazole  40 mg Oral Daily  . sodium chloride  3 mL Intravenous Q12H  . vancomycin  1,500 mg Intravenous Q24H   Assessment: 73 yo obese, paraplegic F who presents with SOB and productive cough.  She also completed 7 day course of IM Rocephin for UTI on day prior to admission.   She is starting on broad-spectrum antibiotics for PNA/bronchitis due to recent rehab stay and immunocompromised stated.  Scr is elevated above baseline but improved since admission. Estimated Creatinine Clearance: 54.4 ml/min (by C-G formula based on Cr of 1.22).  Pt is currently afebrile with normal WBC.   Vancomycin 7/19>> Levaquin 7/19>>  Goal of Therapy:  Vancomycin trough level 15-20 mcg/ml  Plan:  Levaquin 750mg  PO q24hrs Vancomycin 1500mg  IV q24h Check Vancomycin trough at steady state Monitor renal function and cx data  Deescalate antibiotics when appropriate.    Hart Robinsons A 03/01/2014,12:17 PM

## 2014-03-02 DIAGNOSIS — D62 Acute posthemorrhagic anemia: Secondary | ICD-10-CM

## 2014-03-02 LAB — CBC
HEMATOCRIT: 31.5 % — AB (ref 36.0–46.0)
Hemoglobin: 9.9 g/dL — ABNORMAL LOW (ref 12.0–15.0)
MCH: 28.9 pg (ref 26.0–34.0)
MCHC: 31.4 g/dL (ref 30.0–36.0)
MCV: 92.1 fL (ref 78.0–100.0)
Platelets: 174 10*3/uL (ref 150–400)
RBC: 3.42 MIL/uL — AB (ref 3.87–5.11)
RDW: 14.1 % (ref 11.5–15.5)
WBC: 2.3 10*3/uL — AB (ref 4.0–10.5)

## 2014-03-02 LAB — BASIC METABOLIC PANEL
Anion gap: 13 (ref 5–15)
BUN: 23 mg/dL (ref 6–23)
CO2: 28 meq/L (ref 19–32)
Calcium: 8.2 mg/dL — ABNORMAL LOW (ref 8.4–10.5)
Chloride: 99 mEq/L (ref 96–112)
Creatinine, Ser: 1.08 mg/dL (ref 0.50–1.10)
GFR calc Af Amer: 58 mL/min — ABNORMAL LOW (ref >90)
GFR calc non Af Amer: 50 mL/min — ABNORMAL LOW (ref >90)
Glucose, Bld: 245 mg/dL — ABNORMAL HIGH (ref 70–99)
POTASSIUM: 3.4 meq/L — AB (ref 3.7–5.3)
SODIUM: 140 meq/L (ref 137–147)

## 2014-03-02 MED ORDER — FUROSEMIDE 10 MG/ML IJ SOLN
80.0000 mg | Freq: Three times a day (TID) | INTRAMUSCULAR | Status: DC
  Administered 2014-03-02 – 2014-03-05 (×10): 80 mg via INTRAVENOUS
  Filled 2014-03-02 (×10): qty 8

## 2014-03-02 MED ORDER — POTASSIUM CHLORIDE CRYS ER 20 MEQ PO TBCR
40.0000 meq | EXTENDED_RELEASE_TABLET | Freq: Once | ORAL | Status: AC
  Administered 2014-03-02: 40 meq via ORAL
  Filled 2014-03-02: qty 2

## 2014-03-02 NOTE — Progress Notes (Signed)
Pt is unable to stand this morning due to +4 edema to bilateral LE. Weight on bed is malfunctioning. Will pass on to day shift to change beds or get maintenance to look at equipment.

## 2014-03-02 NOTE — Progress Notes (Signed)
[facility] PROGRESS NOTE  [name] [name] UYQ:[id number] DOB: 13-Aug-1940 DOA: 02/27/2014 PCP: [name], MD  Subjective: Slight shortness of breath, some cough with minimal sputum production.  Assessment/Plan:  Acute respiratory failure Likely secondary to pneumonia and pulmonary edema, patient does have a history of CHF.  She was started on Lasix yesterday. Cardiology was consulted in the increased the Lasix to 80 mg IV every 12 hours. We'll continue to monitor the intake and output.  Pneumonia (healthcare associated pneumonia) Patient has been started on vancomycin and Levaquin for possible healthcare associated pneumonia, Legionella and urinary strep antigen are negative. Will discontinue vancomycin. Continue with Levaquin.  Acute Bronchitis- Start Solumedrol 60 mg IV q 6 hrs, duoneb nebulizer q 4 hrs scheduled.Mucinex 1200 mg po BID  Elevated troponin Likely due to demand ischemia. Patient has a history of 3 cardiac catheterizations which revealed nonobstructive CAD and normal LV function. Continue to do her, metoprolol 25 mg by mouth daily. Per cardiology recommendation further workup.  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  Acute kidney injury- today creatinine is down to 1.08, improved after diuresis.  Stage IV non-Hodgkin lymphoma- no further treatment as per patient  Chronic pain- patient treated with a fentanyl patch   Code Status: DNR Family Communication: Discussed with patient in the presence of her daughter at bedside.  Disposition Plan: *Home when stable   Consultants:  Cardiology  Procedures:  None  Antibiotics:  *None  HPI/Subjective: 73 y.o. female with paraplegia who is wheelchair-bound, a history of stage IV non-Hodgkin's lymphoma no longer being treated but also not on hospice, hypertension, nonobstructive coronary artery disease, TIA, history of PE in  the past and chronic pain.  The patient presents for shortness of breath which she states started this morning she's had a cough for a few weeks now but but for the past 4 days it has been productive of cream-colored sputum. Her daughter states that her mother had broken out into a sweat this morning and was found in the ER to have a fever of 100.1. They also note that she has been gaining weight despite barely eating and her ankles are becoming more swollen. The daughter also states that the patient is being treated for a urinary tract infection and according to her medication list has received 7 days of IM Rocephin which ended yesterday. The patient admits to burning micturition. Other review of systems is as below.  I am admitting her for pulmonary edema and possible underlying pneumonia versus a bronchitis. She also has acute kidney failure and hyperkalemia.   Objective: Filed Vitals:   03/02/14 0517  BP: 128/50  Pulse: 75  Temp: 98 F (36.7 C)  Resp: 18    Intake/Output Summary (Last 24 hours) at 03/02/14 1335 Last data filed at 03/02/14 0220  Gross per 24 hour  Intake    480 ml  Output   2500 ml  Net  -2020 ml   Filed Weights   02/27/14 1842  Weight: 114.306 kg (252 lb)    Exam:  Physical Exam: Head: Normocephalic, atraumatic.  Lungs: Bilateral rhonchi  Heart: Regular RR. S1 and S2 normal  Abdomen: BS normoactive. Soft, Nondistended, non-tender.  Extremities: Bilateral 2+ edema   Data Reviewed: Basic Metabolic Panel:  Recent Labs Lab 02/27/14 1101 02/28/14 0438 03/01/14 1443 03/02/14 0556  NA 135* 139 137 140  K 5.8* 3.7 3.9 3.4*  CL 101 101 101 99  CO2 23 27  27 28  GLUCOSE 159* 111* 260* 245*  BUN 24* 24* 25* 23  CREATININE 1.30* 1.22* 1.30* 1.08  CALCIUM 8.1* 8.2* 7.7* 8.2*   Liver Function Tests:  Recent Labs Lab 02/27/14 1101  AST 34  ALT 26  ALKPHOS 174*  BILITOT 0.2*  PROT 6.2  ALBUMIN 2.5*   No results found for this basename: LIPASE,  AMYLASE,  in the last 168 hours No results found for this basename: AMMONIA,  in the last 168 hours CBC:  Recent Labs Lab 02/27/14 1101 02/28/14 0438 03/02/14 0556  WBC 6.0 4.0 2.3*  NEUTROABS 4.5  --   --   HGB 11.1* 10.3* 9.9*  HCT 36.0 33.5* 31.5*  MCV 94.2 93.8 92.1  PLT 179 186 174   Cardiac Enzymes:  Recent Labs Lab 02/27/14 1101 02/27/14 1426 02/27/14 2157 02/28/14 0438 02/28/14 0949  TROPONINI 0.47* 0.43* <0.30 <0.30 0.34*   BNP (last 3 results)  Recent Labs  02/27/14 1101  PROBNP 7765.0*   CBG:  Recent Labs Lab 03/01/14 0458  GLUCAP 107*    No results found for this or any previous visit (from the past 240 hour(s)).   Studies: No results found.  Scheduled Meds: . ALPRAZolam  0.5 mg Oral BID  . aspirin  81 mg Oral Daily  . buPROPion  150 mg Oral BID  . citalopram  20 mg Oral q morning - 10a  . docusate sodium  100 mg Oral BID  . enoxaparin (LOVENOX) injection  60 mg Subcutaneous Q24H  . fentaNYL  100 mcg Transdermal Q72H  . furosemide  80 mg Intravenous TID  . gabapentin  300 mg Oral TID  . guaiFENesin  600 mg Oral BID  . ipratropium-albuterol  3 mL Nebulization Q4H  . levofloxacin  750 mg Oral q1800  . levothyroxine  50 mcg Oral QAC breakfast  . methylPREDNISolone (SOLU-MEDROL) injection  60 mg Intravenous Q6H  . pantoprazole  40 mg Oral Daily  . sodium chloride  3 mL Intravenous Q12H   Continuous Infusions:   Principal Problem:   Acute respiratory failure Active Problems:   Essential hypertension, benign   Acute renal failure   Paraplegia following spinal cord injury   Hodgkin's lymphoma   Hyperkalemia   Pulmonary edema    Time spent: *25 minutes    [facility] Pager [phone]. If 7PM-7AM, please contact night-coverage at [email or web address], password [facility] [place] 03/02/2014, 1:35 PM  LOS: 3 days

## 2014-03-02 NOTE — Progress Notes (Addendum)
Patient ID: [name] [name], female   DOB: 11/23/1940, 73 y.o.   MRN: [id number]      Subjective:    + SOB but some improvement  Objective:   Temp:  [98 F (36.7 C)-98.2 F (36.8 C)] 98 F (36.7 C) (07/22 0517) Pulse Rate:  [75-80] 75 (07/22 0517) Resp:  [18] 18 (07/22 0517) BP: (99-134)/(50-62) 128/50 mmHg (07/22 0517) SpO2:  [91 %-96 %] 93 % (07/22 0725) Last BM Date: 02/27/14  Filed Weights   02/27/14 1842  Weight: 252 lb (114.306 kg)    Intake/Output Summary (Last 24 hours) at 03/02/14 0856 Last data filed at 03/02/14 0220  Gross per 24 hour  Intake   1340 ml  Output   2800 ml  Net  -1460 ml    Telemetry: NSR  Exam:  General: NAD  Resp: crackles bialterally  Cardiac: RRR, 2/6 systolic murmur RUSB, JVD just below angle of jaw  GI: abodmen soft, NT, ND  MSK: 3+ bilateral edema   Lab Results:  Basic Metabolic Panel:  Recent Labs Lab 02/28/14 0438 03/01/14 1443 03/02/14 0556  NA 139 137 140  K 3.7 3.9 3.4*  CL 101 101 99  CO2 27 27 28   GLUCOSE 111* 260* 245*  BUN 24* 25* 23  CREATININE 1.22* 1.30* 1.08  CALCIUM 8.2* 7.7* 8.2*    Liver Function Tests:  Recent Labs Lab 02/27/14 1101  AST 34  ALT 26  ALKPHOS 174*  BILITOT 0.2*  PROT 6.2  ALBUMIN 2.5*    CBC:  Recent Labs Lab 02/27/14 1101 02/28/14 0438 03/02/14 0556  WBC 6.0 4.0 2.3*  HGB 11.1* 10.3* 9.9*  HCT 36.0 33.5* 31.5*  MCV 94.2 93.8 92.1  PLT 179 186 174    Cardiac Enzymes:  Recent Labs Lab 02/27/14 2157 02/28/14 0438 02/28/14 0949  TROPONINI <0.30 <0.30 0.34*    BNP:  Recent Labs  02/27/14 1101  PROBNP 7765.0*    Coagulation: No results found for this basename: INR,  in the last 168 hours  ECG:   Medications:   Scheduled Medications: . ALPRAZolam  0.5 mg Oral BID  . aspirin  81 mg Oral Daily  . buPROPion  150 mg Oral BID  . citalopram  20 mg Oral q morning - 10a  . docusate sodium  100 mg Oral BID  . enoxaparin (LOVENOX) injection  60 mg  Subcutaneous Q24H  . fentaNYL  100 mcg Transdermal Q72H  . furosemide  80 mg Intravenous BID  . gabapentin  300 mg Oral TID  . guaiFENesin  600 mg Oral BID  . ipratropium-albuterol  3 mL Nebulization Q4H  . levofloxacin  750 mg Oral q1800  . levothyroxine  50 mcg Oral QAC breakfast  . methylPREDNISolone (SOLU-MEDROL) injection  60 mg Intravenous Q6H  . pantoprazole  40 mg Oral Daily  . sodium chloride  3 mL Intravenous Q12H     Infusions:     PRN Medications:  sodium chloride, acetaminophen, acetaminophen, albuterol, alum & mag hydroxide-simeth, LORazepam, ondansetron (ZOFRAN) IV, ondansetron, sodium chloride  03/01/14 Echo Study Conclusions  - Left ventricle: The cavity size was mildly dilated. Wall thickness was normal. Systolic function was normal. The estimated ejection fraction was in the range of 55% to 60%. Doppler parameters are consistent with abnormal left ventricular relaxation (grade 1 diastolic dysfunction). - Aortic valve: Av is thckened, calcified with mildly restricted motion Peak and mean gradients through the valve aer 33 and 19 consistent with mild AS. - Mitral valve:  There was mild regurgitation. - Left atrium: The atrium was mildly dilated. - Pulmonary arteries: PA peak pressure: 37 mm Hg (S).      Assessment/Plan   73 yo female with paraplegia, hx of non-Hodgkins lymphoma, HTN, hx of PE admitted with SOB.   1. Acute diastolic heart failure - significant signs of massive volume overload and pulm edema on admission - echo LVEF 14-78%, grade I diastolic dysfunction.  - negative 1.4 liters yesterday, net negative 1.8 liters since admission. Cr trending down with diuresis, she is on lasix 80mg  IV bid - keep K at 4 and Mg at 2 with diuresis with daily labs. I have written for 39mEq of KCl this morning.  - increase lasix to 80mg  IV tid, given her massive overload her daily net negatives can be higher than 1.8 liters.   2. Pneumonia - abx per primary  team  3. NSTEMI - mild elevation peak 0.47 and trending down, now resolved. No acute ischemic changes on EKG though abnormal at baseline. Echo with no WMAs, normal LVEF - suspect demand ischemia in setting of volume overload, as well as AKI.   - no strong indication for ischemic testing at this time. Given her DNR status and multiple medical co morbidities would be hesitant to pursue ischemic testing, no indication at this time.   4. AKI - improving with diuresis, likely due to CHF  5. Mild Aortic stenosis - noted on recent echo, follow clinically.  - by parameters mild to borderline moderate with mean grad 19, AVA 1.5  6. Hypothyroidism - with presentation of new onset heart failure and anasarca recommend repeating thyroid studies.   Carlyle Dolly, M.D., F.A.C.C.

## 2014-03-02 NOTE — Progress Notes (Signed)
ANTIBIOTIC CONSULT NOTE - follow up  Pharmacy Consult for Levaquin  Indication: pneumonia  Allergies  Allergen Reactions  . Bee Venom Hives  . Codeine Palpitations    Per patient, "Feels like I'm having a heart attack."   Patient Measurements: Height: 5\' 7"  (170.2 cm) Weight: 252 lb (114.306 kg) IBW/kg (Calculated) : 61.6  Vital Signs: Temp: 98 F (36.7 C) (07/22 0517) Temp src: Oral (07/22 0517) BP: 128/50 mmHg (07/22 0517) Pulse Rate: 75 (07/22 0517) Intake/Output from previous day: 07/21 0701 - 07/22 0700 In: 1340 [P.O.:840; IV Piggyback:500] Out: 2800 [Urine:2800] Intake/Output from this shift:    Labs:  Recent Labs  02/27/14 1101 02/28/14 0438 03/01/14 1443 03/02/14 0556  WBC 6.0 4.0  --  2.3*  HGB 11.1* 10.3*  --  9.9*  PLT 179 186  --  174  CREATININE 1.30* 1.22* 1.30* 1.08   Estimated Creatinine Clearance: 61.5 ml/min (by C-G formula based on Cr of 1.08). No results found for this basename: VANCOTROUGH, VANCOPEAK, VANCORANDOM, GENTTROUGH, GENTPEAK, GENTRANDOM, TOBRATROUGH, TOBRAPEAK, TOBRARND, AMIKACINPEAK, AMIKACINTROU, AMIKACIN,  in the last 72 hours   Microbiology: No results found for this or any previous visit (from the past 720 hour(s)).  Medical History: Past Medical History  Diagnosis Date  . Non Hodgkin's lymphoma   . Other chronic pain   . Esophageal reflux   . Unspecified essential hypertension   . Personal history of pulmonary embolism   . Coronary atherosclerosis of native coronary artery     CATHETERIZATION X 3  . Dysthymic disorder   . Morbid obesity   . Fibromyalgia   . GERD (gastroesophageal reflux disease)   . Anxiety   . Myocardial infarction   . TIA (transient ischemic attack)   . Complication of anesthesia   . PONV (postoperative nausea and vomiting)    Medications:  Scheduled:  . ALPRAZolam  0.5 mg Oral BID  . aspirin  81 mg Oral Daily  . buPROPion  150 mg Oral BID  . citalopram  20 mg Oral q morning - 10a  .  docusate sodium  100 mg Oral BID  . enoxaparin (LOVENOX) injection  60 mg Subcutaneous Q24H  . fentaNYL  100 mcg Transdermal Q72H  . furosemide  80 mg Intravenous TID  . gabapentin  300 mg Oral TID  . guaiFENesin  600 mg Oral BID  . ipratropium-albuterol  3 mL Nebulization Q4H  . levofloxacin  750 mg Oral q1800  . levothyroxine  50 mcg Oral QAC breakfast  . methylPREDNISolone (SOLU-MEDROL) injection  60 mg Intravenous Q6H  . pantoprazole  40 mg Oral Daily  . sodium chloride  3 mL Intravenous Q12H   Assessment: 73 yo obese, paraplegic F who presents with SOB and productive cough.  She also completed 7 day course of IM Rocephin for UTI on day prior to admission.   She was started on broad-spectrum antibiotics for PNA/bronchitis due to recent rehab stay and immunocompromised stated.  Pt is currently afebrile with normal WBC.  Renal function has improved to patient's baseline.   Vancomycin 7/19>>7/21 Levaquin 7/19>>  Goal of Therapy:  Vancomycin trough level 15-20 mcg/ml  Plan:  Levaquin 750mg  PO q24hrs Duration of therapy per MD No further dose adjustments anticipated- pharmacy to sign off.  Please re-consult if needed.    Biagio Borg 03/02/2014,10:38 AM

## 2014-03-02 NOTE — Progress Notes (Signed)
Inpatient Diabetes Program Recommendations  AACE/ADA: New Consensus Statement on Inpatient Glycemic Control (2013)  Target Ranges:  Prepandial:   less than 140 mg/dL      Peak postprandial:   less than 180 mg/dL (1-2 hours)      Critically ill patients:  140 - 180 mg/dL   Results for DORTHIE, SANTINI (MRN [id number]) as of 03/02/2014 10:44  Ref. Range 02/27/2014 11:01 02/28/2014 04:38 03/01/2014 14:43 03/02/2014 05:56  Glucose Latest Range: 70-99 mg/dL 159 (H) 111 (H) 260 (H) 245 (H)   Diabetes history: No Outpatient Diabetes medications: NA Current orders for Inpatient glycemic control: None  Inpatient Diabetes Program Recommendations Correction (SSI): While inpatient and ordered steroids, please order CBGs with Novolog correction scale ACHS. HgbA1C: Please consider ordering an A1C to evaluate glycemic control over the past 2-3 months. Diet: May want to consider adding Carb Modified to current low sodium diet.  Thanks, Barnie Alderman, RN, MSN, CCRN Diabetes Coordinator Inpatient Diabetes Program [phone] (Team Pager) [phone] ([facility] office) [phone] [facility] office)

## 2014-03-02 NOTE — Progress Notes (Signed)
Speech Language Pathology Treatment: Dysphagia  Patient Details Name: [name] [name] MRN: [id number] DOB: 1940/10/15 Today's Date: 03/02/2014 Time: 1145-1200 SLP Time Calculation (min): 15 min  Assessment / Plan / Recommendation Clinical Impression  Dysphagia treatment today for diet tolerance check, compensatory strategy training, and pt education. Today the pt showed no immediate s/s of aspiration but did have a delayed cough x2 during treatment. Pt reports having a baseline cough for the past several days, so at bedside unable to discern whether cough could be d/t pharyngeal/ esophageal dysfunction. Pt reported one instance of getting choked with liquids this a.m. and believed it may have been d/t bed positioning. Provided training of compensatory strategies- small bites/ sips, pausing after each sip from straw, and sitting bed upright for any PO intake. Recommend continuing dysphagia 3 diet with thin liquids, meds whole with liquid, providing intermittent supervision during meals to cue for strategies listed above; also ensure that bed is upright as close to 90 degrees as possible to further decrease risk of aspiration. ST will continue to follow at least 1 more visit for diet tolerance and consideration of objective evaluation if cough continues to be present during PO intake.    HPI HPI: This is a 73 year old female paraplegic who is wheelchair bound, has a history of stage IV non-Hodgkin's lymphoma and is no longer being treated but is also not on hospice, lung CA. She presented with shortness of breath and productive sputum. She was found to be in pulmonary edema and respiratory failure as well as acute kidney failure and hyperkalemia.BNP 7765, Troponin .47 and .34. She states that she has had worsening edema and fluid buildup for several months. She is taken care of by her husband and niece. She denies any chest pain, palpitations, dizziness or presyncope. She has been in and out of [facility] this spring.    Pertinent Vitals n/a  SLP Plan  Continue with current plan of care    Recommendations Diet recommendations: Dysphagia 3 (mechanical soft);Thin liquid Liquids provided via: Cup;Straw Medication Administration: Whole meds with liquid Supervision: Intermittent supervision to cue for compensatory strategies Compensations: Slow rate;Small sips/bites Postural Changes and/or Swallow Maneuvers: Seated upright 90 degrees;Upright 30-60 min after meal              Oral Care Recommendations: Oral care BID Follow up Recommendations: None Plan: Continue with current plan of care    [place], Amy K, MA, CCC-SLP 03/02/2014, 12:05 PM

## 2014-03-03 ENCOUNTER — Inpatient Hospital Stay (HOSPITAL_COMMUNITY): Payer: PRIVATE HEALTH INSURANCE

## 2014-03-03 ENCOUNTER — Encounter (HOSPITAL_COMMUNITY): Payer: Self-pay | Admitting: Internal Medicine

## 2014-03-03 DIAGNOSIS — R739 Hyperglycemia, unspecified: Secondary | ICD-10-CM | POA: Diagnosis not present

## 2014-03-03 DIAGNOSIS — I214 Non-ST elevation (NSTEMI) myocardial infarction: Secondary | ICD-10-CM

## 2014-03-03 DIAGNOSIS — T50905A Adverse effect of unspecified drugs, medicaments and biological substances, initial encounter: Secondary | ICD-10-CM

## 2014-03-03 DIAGNOSIS — I5031 Acute diastolic (congestive) heart failure: Principal | ICD-10-CM

## 2014-03-03 DIAGNOSIS — J189 Pneumonia, unspecified organism: Secondary | ICD-10-CM

## 2014-03-03 HISTORY — DX: Pneumonia, unspecified organism: J18.9

## 2014-03-03 LAB — BASIC METABOLIC PANEL
Anion gap: 11 (ref 5–15)
BUN: 29 mg/dL — AB (ref 6–23)
CHLORIDE: 98 meq/L (ref 96–112)
CO2: 30 meq/L (ref 19–32)
CREATININE: 1.13 mg/dL — AB (ref 0.50–1.10)
Calcium: 8.4 mg/dL (ref 8.4–10.5)
GFR calc Af Amer: 55 mL/min — ABNORMAL LOW (ref >90)
GFR calc non Af Amer: 47 mL/min — ABNORMAL LOW (ref >90)
Glucose, Bld: 225 mg/dL — ABNORMAL HIGH (ref 70–99)
POTASSIUM: 4 meq/L (ref 3.7–5.3)
Sodium: 139 mEq/L (ref 137–147)

## 2014-03-03 LAB — GLUCOSE, CAPILLARY
GLUCOSE-CAPILLARY: 243 mg/dL — AB (ref 70–99)
Glucose-Capillary: 183 mg/dL — ABNORMAL HIGH (ref 70–99)
Glucose-Capillary: 169 mg/dL — ABNORMAL HIGH (ref 70–99)

## 2014-03-03 MED ORDER — POLYETHYLENE GLYCOL 3350 17 G PO PACK
17.0000 g | PACK | Freq: Every day | ORAL | Status: DC
  Administered 2014-03-03 – 2014-03-07 (×5): 17 g via ORAL
  Filled 2014-03-03 (×5): qty 1

## 2014-03-03 MED ORDER — METOPROLOL TARTRATE 25 MG PO TABS
12.5000 mg | ORAL_TABLET | Freq: Two times a day (BID) | ORAL | Status: DC
Start: 2014-03-03 — End: 2014-03-08
  Administered 2014-03-03 – 2014-03-07 (×10): 12.5 mg via ORAL
  Filled 2014-03-03 (×10): qty 1

## 2014-03-03 MED ORDER — IPRATROPIUM-ALBUTEROL 0.5-2.5 (3) MG/3ML IN SOLN
3.0000 mL | Freq: Four times a day (QID) | RESPIRATORY_TRACT | Status: DC
  Administered 2014-03-03 – 2014-03-05 (×8): 3 mL via RESPIRATORY_TRACT
  Filled 2014-03-03 (×9): qty 3

## 2014-03-03 MED ORDER — INSULIN ASPART 100 UNIT/ML ~~LOC~~ SOLN
0.0000 [IU] | Freq: Every day | SUBCUTANEOUS | Status: DC
  Administered 2014-03-04: 2 [IU] via SUBCUTANEOUS

## 2014-03-03 MED ORDER — INSULIN ASPART 100 UNIT/ML ~~LOC~~ SOLN
0.0000 [IU] | Freq: Three times a day (TID) | SUBCUTANEOUS | Status: DC
  Administered 2014-03-03: 4 [IU] via SUBCUTANEOUS
  Administered 2014-03-03 – 2014-03-05 (×5): 7 [IU] via SUBCUTANEOUS
  Administered 2014-03-05: 20 [IU] via SUBCUTANEOUS
  Administered 2014-03-06: 4 [IU] via SUBCUTANEOUS
  Administered 2014-03-06: 15 [IU] via SUBCUTANEOUS
  Administered 2014-03-06: 7 [IU] via SUBCUTANEOUS

## 2014-03-03 NOTE — Care Management Note (Addendum)
    Page 1 of 2   03/08/2014     4:52:21 PM CARE MANAGEMENT NOTE 03/08/2014  Patient:  [name] [name], [name] [name]   Account Number:  [id number]  Date Initiated:  03/03/2014  Documentation initiated by:  Theophilus Kinds  Subjective/Objective Assessment:   Pt admitted from home with acute respiratory failure. Pt lives with her husband and will return home at discharge. Pt has a CAp aide M-F, 8 hours a day. pt is active with [facility] RN. Pt has a walker for home use.     Action/Plan:   Pt will need HH PT at discharge and want to continue [facility] services with [facility]. Will assess pt for need for home O2 prior to discharge and arrange if qualifies. Will continue to follow.   Anticipated DC Date:  03/07/2014   Anticipated DC Plan:  [place]  CM consult      [facility] Choice  Resumption Of Svcs/PTA Provider  [place]   Choice offered to / List presented to:  C-1 Patient   DME arranged  [facility]      DME agency  [place] arranged  HH-1 RN  [place].   Status of service:  Completed, signed off Medicare Important Message given?  YES (If response is "NO", the following Medicare IM given date fields will be blank) Date Medicare IM given:  03/04/2014 Medicare IM given by:  Theophilus Kinds Date Additional Medicare IM given:  03/07/2014 Additional Medicare IM given by:  Vladimir Creeks  Discharge Disposition:  [place]  Per UR Regulation:  Reviewed for med. necessity/level of care/duration of stay  If discussed at [place] of Stay Meetings, dates discussed:    Comments:  03/08/14 [place] RN/CM   Pt home home with O2, nebulizer as sats still droping to 87-89% as of yesterday. Home per EMS 03/04/14 [place], RN BSN CM Anticipate discharge over the weekend. Weekend staff to arrange resumption of [facility] at discharge. Staff also aware to  assess pt for need for home O2.  03/02/14 [place], RN BSN CM

## 2014-03-03 NOTE — Progress Notes (Signed)
[facility] PROGRESS NOTE  [name] [name] UVO:[id number] DOB: 17-Jan-1941 DOA: 02/27/2014 PCP: [name], MD  Subjective: The patient is slightly less short of breath at rest. She continues to have chest congestion and a mild cough. She denies chest pain.  Assessment/Plan:  Acute respiratory failure with hypoxia secondary to healthcare associated pneumonia and acute diastolic heart failure. She is improving slowly. She is afebrile. Her white blood cell count is low-normal. We will continue IV Lasix, Levaquin, oxygen, and dual nebulizers. Likely secondary to pneumonia and pulmonary edema, patient does have a history of   Pneumonia (healthcare associated pneumonia) The patient was started on vancomycin and Levaquin on admission. She is now only on Levaquin as the vancomycin was discontinued on 7/21. She is afebrile and her white blood cell count is low-normal. Legionella and urinary strep antigen were negative. Influenza panel was negative. She is improving slowly.  Acute Bronchitis-  She was started on  Solumedrol 60 mg IV q 6 hrs, duoneb nebulizer q 4 hrs and.Mucinex 1200 mg po BID. we'll wean Solu-Medrol accordingly.  Acute diastolic heart failure. Her 2-D echo revealed grade 1 diastolic dysfunction and ejection fraction of 55-60%. She was started on IV Lasix at 80 mg twice a day, but it was increased to 3 times a day. She has had good diuresis, but still has evidence of significant peripheral edema. Cardiology is following. Appreciate input. We'll continue current management.   Elevated troponin secondary to non-ST elevation myocardial infarction. This was likely due to demand ischemia. Patient has a history of 3 cardiac catheterizations which revealed nonobstructive CAD and normal LV function. We'll continue aspirin and prophylactic Lovenox. Apparently metoprolol was withheld due to low-normal blood pressures. Cardiology has restarted metoprolol today.  Acute kidney failure,  secondary to a hypoperfusion from heart failure. Her creatinine has improved from 1.3 on admission to 1.13 with diuresis. Her urine output is adequate. Continue to monitor.  Hyperkalemia. Etiology unclear other than possible contribution from acute renal injury. She was treated successfully with Kayexalate. Her serum potassium has normalized.  Hyperglycemia, likely steroid induced. We'll order a hemoglobin A1c and start sliding scale NovoLog. We'll plan to decrease Solu-Medrol which will improve glycemic control.  Hypothyroidism. Continue Synthroid. TSH was ordered and is pending.  Anemia, likely of chronic disease from lymphoma. Continue to monitor with no further investigation or workup at this time.  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   Code Status: DNR Family Communication: Discussed with patient.  Disposition Plan: Discharge to home when clinically appropriate.   Consultants:  Cardiology  Procedures:  Echocardiogram 03/01/2014:  Study Conclusions - Left ventricle: The cavity size was mildly dilated. Wall thickness was normal. Systolic function was normal. The estimated ejection fraction was in the range of 55% to 60%. Doppler parameters are consistent with abnormal left ventricular relaxation (grade 1 diastolic dysfunction). - Aortic valve: Av is thckened, calcified with mildly restricted motion Peak and mean gradients through the valve aer 33 and 19 consistent with mild AS. - Mitral valve: There was mild regurgitation. - Left atrium: The atrium was mildly dilated. - Pulmonary arteries: PA peak pressure: 37 mm Hg (S).    Antibiotics:  *None  HPI/Subjective: 73 y.o. female with paraplegia who is wheelchair-bound, a history of stage IV non-Hodgkin's lymphoma no longer being  treated but also not on hospice, hypertension,  nonobstructive coronary artery disease, TIA, history of PE in the past and chronic pain.  The patient presented for shortness of breath which she has had for a few weeks, but for the past 4 days prior to admission, she had developed a productive of cream-colored sputum. Her daughter stated that her mother had broken out into a sweat and was found in the ER to have a fever of 100.1. They also noted that she had been gaining weight despite barely eating and her ankles had become more swollen. Her daughter also stated that the patient was being treated for a urinary tract infection and according to her medication list she had received 7 days of IM Rocephin which ended the day before admission. The patient admitted to burning micturition.   Objective: Filed Vitals:   03/03/14 0729  BP:   Pulse: 88  Temp:   Resp: 18   temperature 97.5. Pulse 88. Respiratory rate 18. Blood pressure 138/54. Oxygen saturation 91%.  Intake/Output Summary (Last 24 hours) at 03/03/14 1300 Last data filed at 03/03/14 0900  Gross per 24 hour  Intake    480 ml  Output   4450 ml  Net  -3970 ml   Filed Weights   02/27/14 1842  Weight: 114.306 kg (252 lb)    Exam:  Physical Exam: General: Obese 73 year old woman sitting up in bed, getting ready to eat lunch. No acute distress. Lungs: A few scattered crackles bilaterally. Breathing is nonlabored. Heart: S1, S2, with a 8-7/5 systolic murmur. Abdomen: Positive bowel sounds, obese, nontender, nondistended. Extremities: 2-3+ pitting bilateral lower extremity edema. Neurologic: She is alert and oriented x3. Cranial nerves II through XII are grossly intact.   Data Reviewed: Basic Metabolic Panel:  Recent Labs Lab 02/27/14 1101 02/28/14 0438 03/01/14 1443 03/02/14 0556 03/03/14 0536  NA 135* 139 137 140 139  K 5.8* 3.7 3.9 3.4* 4.0  CL 101 101 101 99 98  CO2 23 27 27 28 30   GLUCOSE 159* 111* 260* 245* 225*  BUN  24* 24* 25* 23 29*  CREATININE 1.30* 1.22* 1.30* 1.08 1.13*  CALCIUM 8.1* 8.2* 7.7* 8.2* 8.4   Liver Function Tests:  Recent Labs Lab 02/27/14 1101  AST 34  ALT 26  ALKPHOS 174*  BILITOT 0.2*  PROT 6.2  ALBUMIN 2.5*   No results found for this basename: LIPASE, AMYLASE,  in the last 168 hours No results found for this basename: AMMONIA,  in the last 168 hours CBC:  Recent Labs Lab 02/27/14 1101 02/28/14 0438 03/02/14 0556  WBC 6.0 4.0 2.3*  NEUTROABS 4.5  --   --   HGB 11.1* 10.3* 9.9*  HCT 36.0 33.5* 31.5*  MCV 94.2 93.8 92.1  PLT 179 186 174   Cardiac Enzymes:  Recent Labs Lab 02/27/14 1101 02/27/14 1426 02/27/14 2157 02/28/14 0438 02/28/14 0949  TROPONINI 0.47* 0.43* <0.30 <0.30 0.34*   BNP (last 3 results)  Recent Labs  02/27/14 1101  PROBNP 7765.0*   CBG:  Recent Labs Lab 03/01/14 0458  GLUCAP 107*    No results found for this or any previous visit (from the past 240 hour(s)).   Studies: No results found.  Scheduled Meds: . ALPRAZolam  0.5 mg Oral BID  . aspirin  81 mg Oral Daily  . buPROPion  150 mg Oral BID  . citalopram  20 mg Oral q morning - 10a  . docusate sodium  100 mg Oral BID  . enoxaparin (LOVENOX) injection  60 mg Subcutaneous Q24H  .  fentaNYL  100 mcg Transdermal Q72H  . furosemide  80 mg Intravenous TID  . gabapentin  300 mg Oral TID  . guaiFENesin  600 mg Oral BID  . insulin aspart  0-20 Units Subcutaneous TID WC  . insulin aspart  0-5 Units Subcutaneous QHS  . ipratropium-albuterol  3 mL Nebulization QID  . levofloxacin  750 mg Oral q1800  . levothyroxine  50 mcg Oral QAC breakfast  . methylPREDNISolone (SOLU-MEDROL) injection  60 mg Intravenous Q6H  . metoprolol tartrate  12.5 mg Oral BID  . pantoprazole  40 mg Oral Daily  . sodium chloride  3 mL Intravenous Q12H   Continuous Infusions:   Principal Problem:   Acute respiratory failure Active Problems:   Acute diastolic heart failure   HCAP  (healthcare-associated pneumonia)   Acute renal failure   Non-ST elevation MI (NSTEMI)   Essential hypertension, benign   Paraplegia following spinal cord injury   Hodgkin's lymphoma   Hyperkalemia   Hyperglycemia, drug-induced    Time spent: 25 minutes    [facility] Pager [phone]. If 7PM-7AM, please contact night-coverage at [email or web address], password [facility] 03/03/2014, 1:00 PM  LOS: 4 days

## 2014-03-03 NOTE — Progress Notes (Signed)
Primary cardiologist: Dr. Carlyle Dolly (visit pending)  Subjective:   Feels "a little better." No chest pain. Legs are still swollen.   Objective:   Temp:  [97.5 F (36.4 C)-98.1 F (36.7 C)] 97.5 F (36.4 C) (07/23 0606) Pulse Rate:  [83-100] 88 (07/23 0729) Resp:  [18] 18 (07/23 0729) BP: (127-141)/(50-56) 138/54 mmHg (07/23 0606) SpO2:  [91 %-95 %] 91 % (07/23 1130) Last BM Date: 03/02/14  Filed Weights   02/27/14 1842  Weight: 252 lb (114.306 kg)    Intake/Output Summary (Last 24 hours) at 03/03/14 1143 Last data filed at 03/03/14 0607  Gross per 24 hour  Intake    480 ml  Output   4450 ml  Net  -3970 ml    Telemetry: Sinus rhythm.  Exam:  General: Morbidly obese, no distress.  Lungs: Decreased breath sounds with scattered rhonchi.  Cardiac: RRR, no gallop, 8-5/2 systolic murmur at the base.  Abdomen: Obese, nontender.  Extremities: 3+, chronic appearing edema.   Lab Results:  Basic Metabolic Panel:  Recent Labs Lab 03/01/14 1443 03/02/14 0556 03/03/14 0536  NA 137 140 139  K 3.9 3.4* 4.0  CL 101 99 98  CO2 27 28 30   GLUCOSE 260* 245* 225*  BUN 25* 23 29*  CREATININE 1.30* 1.08 1.13*  CALCIUM 7.7* 8.2* 8.4    Liver Function Tests:  Recent Labs Lab 02/27/14 1101  AST 34  ALT 26  ALKPHOS 174*  BILITOT 0.2*  PROT 6.2  ALBUMIN 2.5*    CBC:  Recent Labs Lab 02/27/14 1101 02/28/14 0438 03/02/14 0556  WBC 6.0 4.0 2.3*  HGB 11.1* 10.3* 9.9*  HCT 36.0 33.5* 31.5*  MCV 94.2 93.8 92.1  PLT 179 186 174    Cardiac Enzymes:  Recent Labs Lab 02/27/14 2157 02/28/14 0438 02/28/14 0949  TROPONINI <0.30 <0.30 0.34*    Echocardiogram 03/01/2014: Study Conclusions  - Left ventricle: The cavity size was mildly dilated. Wall thickness was normal. Systolic function was normal. The estimated ejection fraction was in the range of 55% to 60%. Doppler parameters are consistent with abnormal left ventricular relaxation (grade  1 diastolic dysfunction). - Aortic valve: Av is thckened, calcified with mildly restricted motion Peak and mean gradients through the valve aer 33 and 19 consistent with mild AS. - Mitral valve: There was mild regurgitation. - Left atrium: The atrium was mildly dilated. - Pulmonary arteries: PA peak pressure: 37 mm Hg (S).    Medications:   Scheduled Medications: . ALPRAZolam  0.5 mg Oral BID  . aspirin  81 mg Oral Daily  . buPROPion  150 mg Oral BID  . citalopram  20 mg Oral q morning - 10a  . docusate sodium  100 mg Oral BID  . enoxaparin (LOVENOX) injection  60 mg Subcutaneous Q24H  . fentaNYL  100 mcg Transdermal Q72H  . furosemide  80 mg Intravenous TID  . gabapentin  300 mg Oral TID  . guaiFENesin  600 mg Oral BID  . ipratropium-albuterol  3 mL Nebulization QID  . levofloxacin  750 mg Oral q1800  . levothyroxine  50 mcg Oral QAC breakfast  . methylPREDNISolone (SOLU-MEDROL) injection  60 mg Intravenous Q6H  . pantoprazole  40 mg Oral Daily  . sodium chloride  3 mL Intravenous Q12H     PRN Medications:  sodium chloride, acetaminophen, acetaminophen, albuterol, alum & mag hydroxide-simeth, LORazepam, ondansetron (ZOFRAN) IV, ondansetron, sodium chloride   Assessment:   1. Acute diastolic heart failure, LVEF  55-60% with grade 1 diastolic dysfunction. Good diuresis with increase following advancement in Lasix dose.  2. Mild aortic stenosis, unlikely of clinical significance at this time.  3. NSTEMI, likely type II event in the setting of volume overload and diastolic heart failure. For medical therapy without further ischemic testing at this time. Prior cardiac catheterization demonstrated minimal coronary plaque 2008.  4. Acute renal insufficiency, improving.   Plan/Discussion:    Plan to continue current dose of Lasix, renal function is stable, and she is mounting a vigorous diuresis with improvement in symptoms. No active chest pain. Otherwise on aspirin, had  been on Lopressor and Imdur as an outpatient at one point. Will resume Lopressor at low dose. Check TSH.   Satira Sark, M.D., F.A.C.C.

## 2014-03-04 ENCOUNTER — Encounter (HOSPITAL_COMMUNITY): Payer: Self-pay | Admitting: Internal Medicine

## 2014-03-04 DIAGNOSIS — I350 Nonrheumatic aortic (valve) stenosis: Secondary | ICD-10-CM | POA: Diagnosis present

## 2014-03-04 DIAGNOSIS — J189 Pneumonia, unspecified organism: Secondary | ICD-10-CM

## 2014-03-04 LAB — CBC
HCT: 34.4 % — ABNORMAL LOW (ref 36.0–46.0)
Hemoglobin: 10.9 g/dL — ABNORMAL LOW (ref 12.0–15.0)
MCH: 28.8 pg (ref 26.0–34.0)
MCHC: 31.7 g/dL (ref 30.0–36.0)
MCV: 90.8 fL (ref 78.0–100.0)
Platelets: 274 10*3/uL (ref 150–400)
RBC: 3.79 MIL/uL — ABNORMAL LOW (ref 3.87–5.11)
RDW: 14.5 % (ref 11.5–15.5)
WBC: 5.6 10*3/uL (ref 4.0–10.5)

## 2014-03-04 LAB — BASIC METABOLIC PANEL
Anion gap: 9 (ref 5–15)
BUN: 36 mg/dL — ABNORMAL HIGH (ref 6–23)
CALCIUM: 8.3 mg/dL — AB (ref 8.4–10.5)
CO2: 33 mEq/L — ABNORMAL HIGH (ref 19–32)
Chloride: 97 mEq/L (ref 96–112)
Creatinine, Ser: 1.16 mg/dL — ABNORMAL HIGH (ref 0.50–1.10)
GFR calc non Af Amer: 46 mL/min — ABNORMAL LOW (ref >90)
GFR, EST AFRICAN AMERICAN: 53 mL/min — AB (ref >90)
Glucose, Bld: 224 mg/dL — ABNORMAL HIGH (ref 70–99)
POTASSIUM: 3.7 meq/L (ref 3.7–5.3)
SODIUM: 139 meq/L (ref 137–147)

## 2014-03-04 LAB — MAGNESIUM: MAGNESIUM: 1.7 mg/dL (ref 1.5–2.5)

## 2014-03-04 LAB — GLUCOSE, CAPILLARY
GLUCOSE-CAPILLARY: 237 mg/dL — AB (ref 70–99)
Glucose-Capillary: 205 mg/dL — ABNORMAL HIGH (ref 70–99)
Glucose-Capillary: 246 mg/dL — ABNORMAL HIGH (ref 70–99)
Glucose-Capillary: 215 mg/dL — ABNORMAL HIGH (ref 70–99)

## 2014-03-04 LAB — TSH
TSH: 2.87 u[IU]/mL (ref 0.350–4.500)
TSH: 0.58 u[IU]/mL (ref 0.350–4.500)

## 2014-03-04 LAB — HEMOGLOBIN A1C
Hgb A1c MFr Bld: 5.8 % — ABNORMAL HIGH (ref <5.7)
MEAN PLASMA GLUCOSE: 120 mg/dL — AB (ref <117)

## 2014-03-04 MED ORDER — METHYLPREDNISOLONE SODIUM SUCC 125 MG IJ SOLR
60.0000 mg | Freq: Three times a day (TID) | INTRAMUSCULAR | Status: DC
  Administered 2014-03-04 – 2014-03-05 (×3): 60 mg via INTRAVENOUS
  Filled 2014-03-04 (×4): qty 2

## 2014-03-04 MED ORDER — POTASSIUM CHLORIDE CRYS ER 20 MEQ PO TBCR
20.0000 meq | EXTENDED_RELEASE_TABLET | Freq: Two times a day (BID) | ORAL | Status: DC
  Administered 2014-03-04 – 2014-03-07 (×7): 20 meq via ORAL
  Filled 2014-03-04 (×7): qty 1

## 2014-03-04 NOTE — Progress Notes (Signed)
[facility] PROGRESS NOTE  [name] [name] VFI:[id number] DOB: Aug 09, 1941 DOA: 02/27/2014 PCP: [name], MD  Subjective: The patient is still slightly less short of breath at rest. She feels less swollen than yesterday. She denies chest pain.  Assessment/Plan:  Acute respiratory failure with hypoxia secondary to healthcare associated pneumonia and acute diastolic heart failure. She is improving slowly. She is afebrile. Her white blood cell count is within normal limits. We will continue IV Lasix, Levaquin, oxygen, and dual nebulizers. Likely secondary to pneumonia, acute bronchitis, and acute diastolic heart failure.   Pneumonia (healthcare associated pneumonia) The patient was started on vancomycin and Levaquin on admission. She is now only on Levaquin as the vancomycin was discontinued on 7/21. She is afebrile and her white blood cell count is within normal limits. Legionella and urinary strep antigen were negative. Influenza panel was negative. She is improving slowly.  Acute Bronchitis-  She was started on  Solumedrol 60 mg IV q 6 hrs, duoneb nebulizer q 4 hrs and.Mucinex 1200 mg po BID. Will wean Solu-Medrol to 60 mg IV Q8 hours.  Acute diastolic heart failure. Her 2-D echo revealed grade 1 diastolic dysfunction and ejection fraction of 55-60%. She was started on IV Lasix at 80 mg twice a day, but it was increased to 3 times a day. She has had good diuresis, but still has evidence of significant peripheral edema. Cardiology is following. Appreciate input. We'll continue current management.  Elevated troponin secondary to non-ST elevation myocardial infarction. This was likely due to demand ischemia. Patient has a history of 3 cardiac catheterizations which revealed nonobstructive CAD and normal LV function. We'll continue aspirin and prophylactic Lovenox. Apparently metoprolol was withheld due to low-normal blood pressures. Cardiology restarted metoprolol on 03/03/14.Marland Kitchen  Acute  kidney failure, secondary to a hypoperfusion from heart failure. Her creatinine has improved from 1.3 on admission. Her urine output is adequate. Continue to monitor. Will adjust Lasix if it appears that she is becoming intravascularly depleted.  Hyperkalemia. Etiology unclear other than possible contribution from acute renal injury. She was treated successfully with Kayexalate. Her serum potassium has normalized. Her serum potassium is decreasing on IV Lasix, so will restart low-dose serum potassium.  Hyperglycemia, likely steroid induced. Hemoglobin A1c 5.8. Started sliding scale NovoLog on 7/23. We'll add Lantus today. We'll plan to decrease Solu-Medrol which will improve glycemic control.  Hypothyroidism. Continue Synthroid. TSH was ordered and is pending.  Anemia, likely of chronic disease from lymphoma. Continue to monitor with no further investigation or workup at this time.  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   Code Status: DNR Family Communication: Discussed with patient.  Disposition Plan: Discharge to home when clinically appropriate.   Consultants:  Cardiology  Procedures:  Echocardiogram 03/01/2014:  Study Conclusions - Left ventricle: The cavity size was mildly dilated. Wall thickness was normal. Systolic function was normal. The estimated ejection fraction was in the range of 55% to 60%. Doppler parameters are consistent with abnormal left ventricular relaxation (grade 1 diastolic dysfunction). - Aortic valve: Av is thckened, calcified with mildly restricted motion Peak and mean gradients through the valve aer 33 and 19 consistent with mild AS. - Mitral valve: There was mild regurgitation. - Left atrium: The atrium was mildly dilated. - Pulmonary arteries: PA peak  pressure: 37 mm Hg (S).    Antibiotics:  Levaquin  HPI/Subjective: 73 y.o. female with paraplegia who is wheelchair-bound, a history of stage IV non-Hodgkin's lymphoma no longer being treated but also not on hospice, hypertension, nonobstructive coronary artery disease, TIA, history of PE in the past and chronic pain.  The patient presented for shortness of breath which she has had for a few weeks, but for the past 4 days prior to admission, she had developed a productive of cream-colored sputum. Her daughter stated that her mother had broken out into a sweat and was found in the ER to have a fever of 100.1. They also noted that she had been gaining weight despite barely eating and her ankles had become more swollen. Her daughter also stated that the patient was being treated for a urinary tract infection and according to her medication list she had received 7 days of IM Rocephin which ended the day before admission. The patient admitted to burning micturition prior to the hospitalization.   Objective: Filed Vitals:   03/04/14 0443  BP: 132/66  Pulse: 77  Temp: 98.4 F (36.9 C)  Resp: 18   oxygen saturation 92% on nasal cannula oxygen.  Intake/Output Summary (Last 24 hours) at 03/04/14 1205 Last data filed at 03/04/14 1134  Gross per 24 hour  Intake   1200 ml  Output   4800 ml  Net  -3600 ml   Filed Weights   02/27/14 1842 03/04/14 0443  Weight: 114.306 kg (252 lb) 117.5 kg (259 lb 0.7 oz)    Exam:  Physical Exam: General: Obese 73 year old woman sitting up in bed. No acute distress. Lungs: A few scattered crackles bilaterally. Breathing is nonlabored. Heart: S1, S2, with a 7-9/3 systolic murmur. Abdomen: Positive bowel sounds, obese, nontender, nondistended. Extremities: 2 + pitting bilateral lower extremity edema, decreased from yesterday. Neurologic: She is alert and oriented x3. Cranial nerves II through XII are grossly intact.   Data Reviewed: Basic Metabolic  Panel:  Recent Labs Lab 02/28/14 0438 03/01/14 1443 03/02/14 0556 03/03/14 0536 03/04/14 0515  NA 139 137 140 139 139  K 3.7 3.9 3.4* 4.0 3.7  CL 101 101 99 98 97  CO2 27 27 28 30  33*  GLUCOSE 111* 260* 245* 225* 224*  BUN 24* 25* 23 29* 36*  CREATININE 1.22* 1.30* 1.08 1.13* 1.16*  CALCIUM 8.2* 7.7* 8.2* 8.4 8.3*  MG  --   --   --   --  1.7   Liver Function Tests:  Recent Labs Lab 02/27/14 1101  AST 34  ALT 26  ALKPHOS 174*  BILITOT 0.2*  PROT 6.2  ALBUMIN 2.5*   No results found for this basename: LIPASE, AMYLASE,  in the last 168 hours No results found for this basename: AMMONIA,  in the last 168 hours CBC:  Recent Labs Lab 02/27/14 1101 02/28/14 0438 03/02/14 0556 03/04/14 0515  WBC 6.0 4.0 2.3* 5.6  NEUTROABS 4.5  --   --   --   HGB 11.1* 10.3* 9.9* 10.9*  HCT 36.0 33.5* 31.5* 34.4*  MCV 94.2 93.8 92.1 90.8  PLT 179 186 174 274   Cardiac Enzymes:  Recent Labs Lab 02/27/14 1101 02/27/14 1426 02/27/14 2157 02/28/14 0438 02/28/14 0949  TROPONINI 0.47* 0.43* <0.30 <0.30 0.34*   BNP (last 3 results)  Recent Labs  02/27/14 1101  PROBNP 7765.0*   CBG:  Recent Labs Lab 03/03/14 1158 03/03/14 1708 03/03/14 2136 03/04/14 0731 03/04/14 1132  GLUCAP 243* 183* 169* 205* 246*    No results found for  this or any previous visit (from the past 240 hour(s)).   Studies: Dg Chest Port 1 View  03/03/2014   CLINICAL DATA:  Pneumonia and CHF. Shortness of breath. Mild chest pressure.  EXAM: PORTABLE CHEST - 1 VIEW  COMPARISON:  02/26/2014  FINDINGS: Right jugular Port-A-Cath remains in place with tip overlying the lower SVC, unchanged. Cardiac silhouette remains mildly enlarged. The lungs are hypoinflated with increased elevation of the right hemidiaphragm. There is mild pulmonary vascular congestion. Mild interstitial edema on the prior study has improved. There is no evidence of confluent airspace opacity, pleural effusion, or pneumothorax. Multiple  surgical clips are noted in the left upper abdomen.  IMPRESSION: Shallow inspiration with increased elevation the right hemidiaphragm. Mild pulmonary vascular congestion with improvement of mild interstitial edema.   Electronically Signed   By: Logan Bores   On: 03/03/2014 14:55    Scheduled Meds: . ALPRAZolam  0.5 mg Oral BID  . aspirin  81 mg Oral Daily  . buPROPion  150 mg Oral BID  . citalopram  20 mg Oral q morning - 10a  . docusate sodium  100 mg Oral BID  . enoxaparin (LOVENOX) injection  60 mg Subcutaneous Q24H  . fentaNYL  100 mcg Transdermal Q72H  . furosemide  80 mg Intravenous TID  . gabapentin  300 mg Oral TID  . guaiFENesin  600 mg Oral BID  . insulin aspart  0-20 Units Subcutaneous TID WC  . insulin aspart  0-5 Units Subcutaneous QHS  . ipratropium-albuterol  3 mL Nebulization QID  . levofloxacin  750 mg Oral q1800  . levothyroxine  50 mcg Oral QAC breakfast  . methylPREDNISolone (SOLU-MEDROL) injection  60 mg Intravenous 3 times per day  . metoprolol tartrate  12.5 mg Oral BID  . pantoprazole  40 mg Oral Daily  . polyethylene glycol  17 g Oral Daily  . sodium chloride  3 mL Intravenous Q12H   Continuous Infusions:   Principal Problem:   Acute respiratory failure Active Problems:   Acute diastolic heart failure   HCAP (healthcare-associated pneumonia)   Acute renal failure   Non-ST elevation MI (NSTEMI)   Essential hypertension, benign   Paraplegia following spinal cord injury   Hodgkin's lymphoma   Hyperkalemia   Hyperglycemia, drug-induced   Aortic stenosis, mild    Time spent: 25 minutes    [facility] Pager [phone]. If 7PM-7AM, please contact night-coverage at [email or web address], password [email or web address] 03/04/2014, 12:05 PM  LOS: 5 days

## 2014-03-04 NOTE — Progress Notes (Signed)
Speech Language Pathology Treatment: Dysphagia  Patient Details Name: [name] [name] MRN: [id number] DOB: [birth year] Today's Date: 03/04/2014 Time: 1020-1043 SLP Time Calculation (min): 23 min  Assessment / Plan / Recommendation Clinical Impression  Dysphagia treatment provided today for skilled observation with current diet, compensatory strategy training, and pt education. Pt demonstrated no overt s/s of aspiration at bedside today. Pt reported not getting choked or coughing during meals the past 2 days and that she has been following precautions of taking small bites/ sips at a time. CXR from 7/23 improved from 7/19. Given these findings, current risk of aspiration is low but may increase if reflux precautions are not followed. Reviewed these recommendations with the pt- small bites/ sips, alternate foods with liquids, sit upright 30-60 minutes after meal and sit as upright as possible during meals. Also recommend providing intermittent supervision to pt during meals to cue for these strategies, continuing dysphagia 3/ thin liquid diet. ST will sign off at this time. Please re-consult if needs arise.    HPI HPI: This is a 73 year old female paraplegic who is wheelchair bound, has a history of stage IV non-Hodgkin's lymphoma and is no longer being treated but is also not on hospice, lung CA. She presented with shortness of breath and productive sputum. She was found to be in pulmonary edema and respiratory failure as well as acute kidney failure and hyperkalemia.BNP 7765, Troponin .47 and .34. She states that she has had worsening edema and fluid buildup for several months. She is taken care of by her husband and niece. She denies any chest pain, palpitations, dizziness or presyncope. She has been in and out of [facility] this spring.    Pertinent Vitals n/a  SLP Plan  All goals met    Recommendations Diet recommendations: Dysphagia 3 (mechanical soft);Thin liquid Liquids provided via:  Cup;Straw Medication Administration: Whole meds with liquid Supervision: Intermittent supervision to cue for compensatory strategies Compensations: Slow rate;Small sips/bites;Follow solids with liquid Postural Changes and/or Swallow Maneuvers: Seated upright 90 degrees;Upright 30-60 min after meal              Oral Care Recommendations: Oral care BID Follow up Recommendations: None Plan: All goals met    GO     Kern Reap, MA, CCC-SLP 03/04/2014, 10:46 AM

## 2014-03-04 NOTE — Progress Notes (Signed)
Primary cardiologist: Dr. Carlyle Dolly (visit pending)  Subjective:   Slowly improving leg edema. No chest pain or breathlessness at rest.   Objective:   Temp:  [97.8 F (36.6 C)-98.6 F (37 C)] 98.4 F (36.9 C) (07/24 0443) Pulse Rate:  [77-95] 77 (07/24 0443) Resp:  [18] 18 (07/24 0443) BP: (124-144)/(55-66) 132/66 mmHg (07/24 0443) SpO2:  [91 %-95 %] 95 % (07/24 0443) Weight:  [259 lb 0.7 oz (117.5 kg)] 259 lb 0.7 oz (117.5 kg) (07/24 0443) Last BM Date: 03/02/14  Filed Weights   02/27/14 1842 03/04/14 0443  Weight: 252 lb (114.306 kg) 259 lb 0.7 oz (117.5 kg)    Intake/Output Summary (Last 24 hours) at 03/04/14 0952 Last data filed at 03/04/14 0310  Gross per 24 hour  Intake    960 ml  Output   3900 ml  Net  -2940 ml    Telemetry: Sinus rhythm.  Exam:  General: Morbidly obese, no distress.  Lungs: Decreased breath sounds with scattered rhonchi.  Cardiac: RRR, no gallop, 4-0/1 systolic murmur at the base.  Abdomen: Obese, nontender.  Extremities: 3+, chronic appearing edema.   Lab Results:  Basic Metabolic Panel:  Recent Labs Lab 03/02/14 0556 03/03/14 0536 03/04/14 0515  NA 140 139 139  K 3.4* 4.0 3.7  CL 99 98 97  CO2 28 30 33*  GLUCOSE 245* 225* 224*  BUN 23 29* 36*  CREATININE 1.08 1.13* 1.16*  CALCIUM 8.2* 8.4 8.3*  MG  --   --  1.7    Liver Function Tests:  Recent Labs Lab 02/27/14 1101  AST 34  ALT 26  ALKPHOS 174*  BILITOT 0.2*  PROT 6.2  ALBUMIN 2.5*    CBC:  Recent Labs Lab 02/28/14 0438 03/02/14 0556 03/04/14 0515  WBC 4.0 2.3* 5.6  HGB 10.3* 9.9* 10.9*  HCT 33.5* 31.5* 34.4*  MCV 93.8 92.1 90.8  PLT 186 174 274    Cardiac Enzymes:  Recent Labs Lab 02/27/14 2157 02/28/14 0438 02/28/14 0949  TROPONINI <0.30 <0.30 0.34*    TSH 2.8   Echocardiogram 03/01/2014: Study Conclusions  - Left ventricle: The cavity size was mildly dilated. Wall thickness was normal. Systolic function was normal.  The estimated ejection fraction was in the range of 55% to 60%. Doppler parameters are consistent with abnormal left ventricular relaxation (grade 1 diastolic dysfunction). - Aortic valve: Av is thckened, calcified with mildly restricted motion Peak and mean gradients through the valve aer 33 and 19 consistent with mild AS. - Mitral valve: There was mild regurgitation. - Left atrium: The atrium was mildly dilated. - Pulmonary arteries: PA peak pressure: 37 mm Hg (S).    Medications:   Scheduled Medications: . ALPRAZolam  0.5 mg Oral BID  . aspirin  81 mg Oral Daily  . buPROPion  150 mg Oral BID  . citalopram  20 mg Oral q morning - 10a  . docusate sodium  100 mg Oral BID  . enoxaparin (LOVENOX) injection  60 mg Subcutaneous Q24H  . fentaNYL  100 mcg Transdermal Q72H  . furosemide  80 mg Intravenous TID  . gabapentin  300 mg Oral TID  . guaiFENesin  600 mg Oral BID  . insulin aspart  0-20 Units Subcutaneous TID WC  . insulin aspart  0-5 Units Subcutaneous QHS  . ipratropium-albuterol  3 mL Nebulization QID  . levofloxacin  750 mg Oral q1800  . levothyroxine  50 mcg Oral QAC breakfast  . methylPREDNISolone (SOLU-MEDROL) injection  60 mg Intravenous Q6H  . metoprolol tartrate  12.5 mg Oral BID  . pantoprazole  40 mg Oral Daily  . polyethylene glycol  17 g Oral Daily  . sodium chloride  3 mL Intravenous Q12H    PRN Medications: sodium chloride, acetaminophen, acetaminophen, albuterol, alum & mag hydroxide-simeth, LORazepam, ondansetron (ZOFRAN) IV, ondansetron, sodium chloride   Assessment:   1. Acute diastolic heart failure, LVEF 55-60% with grade 1 diastolic dysfunction. Continues to diurese well on current dose of Lasix with stable renal function.  2. Mild aortic stenosis, unlikely of clinical significance at this time.  3. NSTEMI, likely type II event in the setting of volume overload and diastolic heart failure. For medical therapy without further ischemic testing at  this time. Prior cardiac catheterization demonstrated minimal coronary plaque 2008.  4. Acute renal insufficiency, improving.   Plan/Discussion:    Continue current dose of Lasix, expect that she will need diuresis at least through the weekend to begin to get her toward a more stable baseline weight. Renal function has tolerated the current diuretic regimen.    [name] [name], M.D., F.A.C.C.

## 2014-03-05 ENCOUNTER — Inpatient Hospital Stay (HOSPITAL_COMMUNITY): Payer: PRIVATE HEALTH INSURANCE

## 2014-03-05 LAB — BASIC METABOLIC PANEL
Anion gap: 11 (ref 5–15)
BUN: 39 mg/dL — ABNORMAL HIGH (ref 6–23)
CO2: 33 meq/L — AB (ref 19–32)
Calcium: 8.1 mg/dL — ABNORMAL LOW (ref 8.4–10.5)
Chloride: 96 mEq/L (ref 96–112)
Creatinine, Ser: 1.12 mg/dL — ABNORMAL HIGH (ref 0.50–1.10)
GFR calc Af Amer: 55 mL/min — ABNORMAL LOW (ref >90)
GFR calc non Af Amer: 48 mL/min — ABNORMAL LOW (ref >90)
GLUCOSE: 214 mg/dL — AB (ref 70–99)
POTASSIUM: 3.8 meq/L (ref 3.7–5.3)
Sodium: 140 mEq/L (ref 137–147)

## 2014-03-05 LAB — GLUCOSE, CAPILLARY
GLUCOSE-CAPILLARY: 226 mg/dL — AB (ref 70–99)
GLUCOSE-CAPILLARY: 382 mg/dL — AB (ref 70–99)
GLUCOSE-CAPILLARY: 79 mg/dL (ref 70–99)
GLUCOSE-CAPILLARY: 186 mg/dL — AB (ref 70–99)

## 2014-03-05 MED ORDER — LEVALBUTEROL HCL 0.63 MG/3ML IN NEBU
0.6300 mg | INHALATION_SOLUTION | RESPIRATORY_TRACT | Status: DC
  Administered 2014-03-05 – 2014-03-08 (×17): 0.63 mg via RESPIRATORY_TRACT
  Filled 2014-03-05 (×17): qty 3

## 2014-03-05 MED ORDER — HYDROMORPHONE HCL PF 1 MG/ML IJ SOLN
0.5000 mg | Freq: Once | INTRAMUSCULAR | Status: AC
  Administered 2014-03-05: 0.5 mg via INTRAVENOUS
  Filled 2014-03-05: qty 1

## 2014-03-05 MED ORDER — FUROSEMIDE 10 MG/ML IJ SOLN
60.0000 mg | Freq: Two times a day (BID) | INTRAMUSCULAR | Status: DC
  Administered 2014-03-05 – 2014-03-06 (×2): 60 mg via INTRAVENOUS
  Filled 2014-03-05 (×2): qty 6

## 2014-03-05 MED ORDER — LORAZEPAM 0.5 MG PO TABS
0.5000 mg | ORAL_TABLET | Freq: Four times a day (QID) | ORAL | Status: DC | PRN
  Administered 2014-03-05 – 2014-03-08 (×6): 0.5 mg via ORAL
  Filled 2014-03-05 (×6): qty 1

## 2014-03-05 MED ORDER — METHYLPREDNISOLONE SODIUM SUCC 125 MG IJ SOLR: 60.0000 mg | Freq: Two times a day (BID) | INTRAMUSCULAR | Status: DC

## 2014-03-05 MED ORDER — IPRATROPIUM BROMIDE 0.02 % IN SOLN
0.5000 mg | RESPIRATORY_TRACT | Status: DC
  Administered 2014-03-05 – 2014-03-08 (×17): 0.5 mg via RESPIRATORY_TRACT
  Filled 2014-03-05 (×17): qty 2.5

## 2014-03-05 MED ORDER — METHYLPREDNISOLONE SODIUM SUCC 125 MG IJ SOLR
80.0000 mg | Freq: Two times a day (BID) | INTRAMUSCULAR | Status: DC
  Administered 2014-03-05 – 2014-03-07 (×4): 80 mg via INTRAVENOUS
  Filled 2014-03-05 (×4): qty 2

## 2014-03-05 MED ORDER — NYSTATIN 100000 UNIT/ML MT SUSP
5.0000 mL | Freq: Three times a day (TID) | OROMUCOSAL | Status: AC
  Administered 2014-03-05 – 2014-03-07 (×9): 500000 [IU] via ORAL
  Filled 2014-03-05 (×9): qty 5

## 2014-03-05 NOTE — Progress Notes (Signed)
Patient complains of burning chest pain that is shooting into back. Vital signs stable. Heart rate 98-105. Dr. Caryn Section notified. Will give dose of dilaudid and mylanta as ordered by Dr. Caryn Section. EKG and CXR done.

## 2014-03-05 NOTE — Progress Notes (Signed)
Patients chest pain improved after dilaudid and mylanta. No complaints voiced at this time.

## 2014-03-05 NOTE — Progress Notes (Signed)
[facility] PROGRESS NOTE  [name] [name] OEV:[id number] DOB: 1941/08/01 DOA: 02/27/2014 PCP: [name], MD  Subjective: The patient is still slightly less short of breath at rest. She feels less swollen than yesterday. She denies chest pain. She complains of intermittent tremor of her hands and legs.  Assessment/Plan:  Acute respiratory failure with hypoxia secondary to healthcare associated pneumonia and acute diastolic heart failure. Etiology multifactorial, but most likely secondary to pneumonia, acute bronchitis, and acute diastolic heart failure. She is improving slowly. She is afebrile. Her white blood cell count is within normal limits. We will continue IV Lasix, Levaquin, oxygen, and dual nebulizers.   Pneumonia (healthcare associated pneumonia) The patient was started on vancomycin and Levaquin on admission. She is now only on Levaquin as the vancomycin was discontinued on 7/21. She is afebrile and her white blood cell count is within normal limits. Legionella and urinary strep antigen were negative. Influenza panel was negative. She is improving slowly.  Acute Bronchitis-  She was started on  Solumedrol 60 mg IV q 6 hrs, duoneb nebulizer q 4 hrs and.Mucinex 1200 mg po BID. Will wean Solu-Medrol to 80 g every 12 hours. The steroids and bronchodilators could be contributing to her tremor. Will change albuterol to Xopenex to see if it helps.  Acute diastolic heart failure. Her 2-D echo revealed grade 1 diastolic dysfunction and ejection fraction of 55-60%. She was started on IV Lasix at 80 mg twice a day, but it was increased to 3 times a day. She has had excellent diuresis. She is -12 L and her renal function appears to be holding. Her peripheral edema is subsiding. Cardiology has been assisting with management-appreciate his assistance.  Elevated troponin secondary to non-ST elevation myocardial infarction. This was likely due to demand ischemia. Patient has a history of  3 cardiac catheterizations which revealed nonobstructive CAD and normal LV function. We'll continue aspirin and prophylactic Lovenox. Apparently metoprolol was withheld due to low-normal blood pressures. Cardiology restarted metoprolol on 03/03/14.[name] [name]  Acute kidney failure, secondary to a hypoperfusion from heart failure. Her creatinine has improved from 1.3 on admission. Her urine output is adequate. Continue to monitor. Will adjust Lasix if it appears that she is becoming intravascularly depleted.  Hyperkalemia. Etiology unclear other than possible contribution from acute renal injury. She was treated successfully with Kayexalate. Her serum potassium has normalized. Her serum potassium started decreasing on IV Lasix, so low-dose  potassium chloride supplementation was restarted. We'll monitor daily.  Hyperglycemia, likely steroid induced. Hemoglobin A1c 5.8. Started sliding scale NovoLog on 7/23 and later Lantus. We'll increase Lantus to twice a day. We'll plan to decrease Solu-Medrol which will improve glycemic control.  Hypothyroidism. Continue Synthroid. TSH was within normal limits at 0.58.  Anemia, likely of chronic disease from lymphoma. Continue to monitor with no further investigation or workup at this time.  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  Tremor. The patient has had intermittent tremors but have worsened during the hospitalization. We will continue her gabapentin and add when necessary Ativan. Solu-Medrol has been titrated down. Will change albuterol to Xopenex.     Code Status: DNR Family Communication: Discussed with patient.  Disposition Plan: Discharge to home when clinically appropriate.   Consultants:  Cardiology  Procedures:  Echocardiogram 03/01/2014:  Study  Conclusions - Left ventricle: The cavity size was mildly  dilated. Wall thickness was normal. Systolic function was normal. The estimated ejection fraction was in the range of 55% to 60%. Doppler parameters are consistent with abnormal left ventricular relaxation (grade 1 diastolic dysfunction). - Aortic valve: Av is thckened, calcified with mildly restricted motion Peak and mean gradients through the valve aer 33 and 19 consistent with mild AS. - Mitral valve: There was mild regurgitation. - Left atrium: The atrium was mildly dilated. - Pulmonary arteries: PA peak pressure: 37 mm Hg (S).    Antibiotics:  Levaquin  HPI/Subjective: 73 y.o. female with paraplegia who is wheelchair-bound, a history of stage IV non-Hodgkin's lymphoma no longer being treated but also not on hospice, hypertension, nonobstructive coronary artery disease, TIA, history of PE in the past and chronic pain.  The patient presented for shortness of breath which she has had for a few weeks, but for the past 4 days prior to admission, she had developed a productive of cream-colored sputum. Her daughter stated that her mother had broken out into a sweat and was found in the ER to have a fever of 100.1. They also noted that she had been gaining weight despite barely eating and her ankles had become more swollen. Her daughter also stated that the patient was being treated for a urinary tract infection and according to her medication list she had received 7 days of IM Rocephin which ended the day before admission. The patient admitted to burning micturition prior to the hospitalization.   Objective: Filed Vitals:   03/05/14 1407  BP: 151/63  Pulse: 83  Temp: 97.8 F (36.6 C)  Resp: 18   oxygen saturation 95% on room air.  Intake/Output Summary (Last 24 hours) at 03/05/14 1447 Last data filed at 03/05/14 1406  Gross per 24 hour  Intake    960 ml  Output   3950 ml  Net  -2990 ml   Filed Weights   02/27/14 1842  03/04/14 0443 03/05/14 0353  Weight: 114.306 kg (252 lb) 117.5 kg (259 lb 0.7 oz) 114.669 kg (252 lb 12.8 oz)    Exam:  Physical Exam: General: Obese 73 year old woman sitting up in bed. No acute distress. Lungs: Rhonchus crackles bilaterally. Breathing is nonlabored. Heart: S1, S2, with a 3-7/9 systolic murmur. Abdomen: Positive bowel sounds, obese, nontender, nondistended. Extremities: 2 + pitting bilateral lower extremity edema, decreased from yesterday. Neurologic: She is alert and oriented x3. Cranial nerves II through XII are grossly intact.   Data Reviewed: Basic Metabolic Panel:  Recent Labs Lab 03/01/14 1443 03/02/14 0556 03/03/14 0536 03/04/14 0515 03/05/14 0551  NA 137 140 139 139 140  K 3.9 3.4* 4.0 3.7 3.8  CL 101 99 98 97 96  CO2 27 28 30  33* 33*  GLUCOSE 260* 245* 225* 224* 214*  BUN 25* 23 29* 36* 39*  CREATININE 1.30* 1.08 1.13* 1.16* 1.12*  CALCIUM 7.7* 8.2* 8.4 8.3* 8.1*  MG  --   --   --  1.7  --    Liver Function Tests:  Recent Labs Lab 02/27/14 1101  AST 34  ALT 26  ALKPHOS 174*  BILITOT 0.2*  PROT 6.2  ALBUMIN 2.5*   No results found for this basename: LIPASE, AMYLASE,  in the last 168 hours No results found for this basename: AMMONIA,  in the last 168 hours CBC:  Recent Labs Lab 02/27/14 1101 02/28/14 0438 03/02/14 0556 03/04/14 0515  WBC 6.0 4.0 2.3* 5.6  NEUTROABS 4.5  --   --   --  HGB 11.1* 10.3* 9.9* 10.9*  HCT 36.0 33.5* 31.5* 34.4*  MCV 94.2 93.8 92.1 90.8  PLT 179 186 174 274   Cardiac Enzymes:  Recent Labs Lab 02/27/14 1101 02/27/14 1426 02/27/14 2157 02/28/14 0438 02/28/14 0949  TROPONINI 0.47* 0.43* <0.30 <0.30 0.34*   BNP (last 3 results)  Recent Labs  02/27/14 1101  PROBNP 7765.0*   CBG:  Recent Labs Lab 03/04/14 1132 03/04/14 1617 03/04/14 2201 03/05/14 0737 03/05/14 1122  GLUCAP 246* 215* 237* 226* 382*    No results found for this or any previous visit (from the past 240 hour(s)).    Studies: Dg Chest Port 1 View  03/03/2014   CLINICAL DATA:  Pneumonia and CHF. Shortness of breath. Mild chest pressure.  EXAM: PORTABLE CHEST - 1 VIEW  COMPARISON:  02/26/2014  FINDINGS: Right jugular Port-A-Cath remains in place with tip overlying the lower SVC, unchanged. Cardiac silhouette remains mildly enlarged. The lungs are hypoinflated with increased elevation of the right hemidiaphragm. There is mild pulmonary vascular congestion. Mild interstitial edema on the prior study has improved. There is no evidence of confluent airspace opacity, pleural effusion, or pneumothorax. Multiple surgical clips are noted in the left upper abdomen.  IMPRESSION: Shallow inspiration with increased elevation the right hemidiaphragm. Mild pulmonary vascular congestion with improvement of mild interstitial edema.   Electronically Signed   By: Logan Bores   On: 03/03/2014 14:55    Scheduled Meds: . ALPRAZolam  0.5 mg Oral BID  . aspirin  81 mg Oral Daily  . buPROPion  150 mg Oral BID  . citalopram  20 mg Oral q morning - 10a  . docusate sodium  100 mg Oral BID  . enoxaparin (LOVENOX) injection  60 mg Subcutaneous Q24H  . fentaNYL  100 mcg Transdermal Q72H  . furosemide  80 mg Intravenous TID  . gabapentin  300 mg Oral TID  . guaiFENesin  600 mg Oral BID  . insulin aspart  0-20 Units Subcutaneous TID WC  . insulin aspart  0-5 Units Subcutaneous QHS  . ipratropium-albuterol  3 mL Nebulization QID  . levofloxacin  750 mg Oral q1800  . levothyroxine  50 mcg Oral QAC breakfast  . methylPREDNISolone (SOLU-MEDROL) injection  80 mg Intravenous Q12H  . metoprolol tartrate  12.5 mg Oral BID  . nystatin  5 mL Oral TID  . pantoprazole  40 mg Oral Daily  . polyethylene glycol  17 g Oral Daily  . potassium chloride  20 mEq Oral BID  . sodium chloride  3 mL Intravenous Q12H   Continuous Infusions:   Principal Problem:   Acute respiratory failure Active Problems:   Acute diastolic heart failure   HCAP  (healthcare-associated pneumonia)   Acute renal failure   Non-ST elevation MI (NSTEMI)   Essential hypertension, benign   Paraplegia following spinal cord injury   Hodgkin's lymphoma   Hyperkalemia   Hyperglycemia, drug-induced   Aortic stenosis, mild    Time spent: 25 minutes    [facility] Pager [phone]. If 7PM-7AM, please contact night-coverage at [email or web address], password [facility] 03/05/2014, 2:47 PM  LOS: 6 days

## 2014-03-06 DIAGNOSIS — R7309 Other abnormal glucose: Secondary | ICD-10-CM

## 2014-03-06 DIAGNOSIS — T50904A Poisoning by unspecified drugs, medicaments and biological substances, undetermined, initial encounter: Secondary | ICD-10-CM

## 2014-03-06 LAB — BASIC METABOLIC PANEL
ANION GAP: 10 (ref 5–15)
BUN: 41 mg/dL — ABNORMAL HIGH (ref 6–23)
CALCIUM: 8.1 mg/dL — AB (ref 8.4–10.5)
CO2: 35 mEq/L — ABNORMAL HIGH (ref 19–32)
Chloride: 95 mEq/L — ABNORMAL LOW (ref 96–112)
Creatinine, Ser: 1.24 mg/dL — ABNORMAL HIGH (ref 0.50–1.10)
GFR, EST AFRICAN AMERICAN: 49 mL/min — AB (ref >90)
GFR, EST NON AFRICAN AMERICAN: 42 mL/min — AB (ref >90)
Glucose, Bld: 157 mg/dL — ABNORMAL HIGH (ref 70–99)
POTASSIUM: 4.2 meq/L (ref 3.7–5.3)
SODIUM: 140 meq/L (ref 137–147)

## 2014-03-06 LAB — GLUCOSE, CAPILLARY
GLUCOSE-CAPILLARY: 186 mg/dL — AB (ref 70–99)
GLUCOSE-CAPILLARY: 167 mg/dL — AB (ref 70–99)
Glucose-Capillary: 318 mg/dL — ABNORMAL HIGH (ref 70–99)
Glucose-Capillary: 237 mg/dL — ABNORMAL HIGH (ref 70–99)

## 2014-03-06 MED ORDER — FUROSEMIDE 40 MG PO TABS
40.0000 mg | ORAL_TABLET | Freq: Every day | ORAL | Status: DC
  Administered 2014-03-07: 40 mg via ORAL
  Filled 2014-03-06: qty 1

## 2014-03-06 NOTE — Progress Notes (Signed)
[facility] PROGRESS NOTE  [name] GMW:[id number] DOB: February 09, 1941 DOA: 02/27/2014 PCP: [name], MD  Subjective: The patient is still slightly less short of breath at rest. She feels less swollen than yesterday. She denies chest pain.  Assessment/Plan:  Acute respiratory failure with hypoxia secondary to healthcare associated pneumonia and acute diastolic heart failure. She is improving clinically and radiographically. She is afebrile. Her white blood cell count is within normal limits. We will continue Levaquin, oxygen, and dual nebulizers. We'll discontinue IV Lasix as she is developing acute renal insufficiency and metabolic alkalosis. We'll continue oral Lasix at a lower dose.   Pneumonia (healthcare associated pneumonia) The patient was started on vancomycin and Levaquin on admission. She is now only on Levaquin as the vancomycin was discontinued on 7/21. She is afebrile and her white blood cell count is within normal limits. Legionella and urinary strep antigen were negative. Influenza panel was negative. Her followup chest x-ray on 7/25 revealed improvement.  Acute Bronchitis-  She was started on  Solumedrol 60 mg IV q 6 hrs, duoneb nebulizer q 4 hrs and.Mucinex 1200 mg po BID. Will wean Solu-Medrol to 80 mg IV 24 hours.  Acute diastolic heart failure. Her 2-D echo revealed grade 1 diastolic dysfunction and ejection fraction of 55-60%. She was started on IV Lasix at 80 mg twice a day, but it was increased to 3 times a day. She has had good diuresis at -16.6 L. She has significantly less peripheral edema. Will decrease her Lasix to 40 mg by mouth daily. Appreciate cardiology's assistance.  Elevated troponin secondary to non-ST elevation myocardial infarction. This was likely due to demand ischemia. Patient has a history of 3 cardiac catheterizations which revealed nonobstructive CAD and normal LV function. We'll continue aspirin and prophylactic Lovenox. Apparently  metoprolol was withheld due to low-normal blood pressures. Cardiology restarted metoprolol on 03/03/14.. Of note, the patient had an episode of chest pain on 7/25. Chest x-ray and EKG were ordered and revealed no new or acute findings. She was treated supportively and has no complaints of chest pain today.  Acute kidney failure, secondary to a hypoperfusion from heart failure. Her creatinine has improved from 1.3 on admission. Her urine output is adequate. Continue to monitor. Will adjust Lasix if it appears that she is becoming intravascularly depleted.  Hyperkalemia. Etiology unclear other than possible contribution from acute renal injury. She was treated successfully with Kayexalate. Her serum potassium has normalized. Her serum potassium is decreasing on IV Lasix, so will restart low-dose serum potassium.  Hyperglycemia, likely steroid induced. Hemoglobin A1c 5.8. Started sliding scale NovoLog on 7/23. We'll add Lantus today. We'll plan to decrease Solu-Medrol which will improve glycemic control.  Hypothyroidism. Continue Synthroid. TSH is within normal limits.  Anemia, likely of chronic disease from lymphoma. Continue to monitor with no further investigation or workup at this time.  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   Code Status: DNR Family Communication: Discussed with patient.  Disposition Plan: Discharge to home when clinically appropriate, possibly in the next 24-48 hours.   Consultants:  Cardiology  Procedures:  Echocardiogram 03/01/2014:  Study Conclusions - Left ventricle: The cavity size was mildly dilated. Wall thickness was normal. Systolic function was normal. The estimated ejection fraction was in the range of 55% to 60%. Doppler parameters are consistent with abnormal  left  ventricular relaxation (grade 1 diastolic dysfunction). - Aortic valve: Av is thckened, calcified with mildly restricted motion Peak and mean gradients through the valve aer 33 and 19 consistent with mild AS. - Mitral valve: There was mild regurgitation. - Left atrium: The atrium was mildly dilated. - Pulmonary arteries: PA peak pressure: 37 mm Hg (S).    Antibiotics:  Levaquin  HPI/Subjective: 73 y.o. female with paraplegia who is wheelchair-bound, a history of stage IV non-Hodgkin's lymphoma no longer being treated but also not on hospice, hypertension, nonobstructive coronary artery disease, TIA, history of PE in the past and chronic pain.  The patient presented for shortness of breath which she has had for a few weeks, but for the past 4 days prior to admission, she had developed a productive of cream-colored sputum. Her daughter stated that her mother had broken out into a sweat and was found in the ER to have a fever of 100.1. They also noted that she had been gaining weight despite barely eating and her ankles had become more swollen. Her daughter also stated that the patient was being treated for a urinary tract infection and according to her medication list she had received 7 days of IM Rocephin which ended the day before admission. The patient admitted to burning micturition prior to the hospitalization.   Objective: Filed Vitals:   03/06/14 1316  BP: 115/48  Pulse: 96  Temp: 98 F (36.7 C)  Resp: 18   oxygen saturation 92% on nasal cannula oxygen.  Intake/Output Summary (Last 24 hours) at 03/06/14 1332 Last data filed at 03/06/14 1316  Gross per 24 hour  Intake    960 ml  Output   6100 ml  Net  -5140 ml   Filed Weights   03/04/14 0443 03/05/14 0353 03/06/14 0504  Weight: 117.5 kg (259 lb 0.7 oz) 114.669 kg (252 lb 12.8 oz) 112.946 kg (249 lb)    Exam:  Physical Exam: General: Obese 73 year old woman sitting up in bed. No acute distress. Lungs:  Significantly fewer rhonchus wheezes/rhonchi. Breathing is nonlabored. Heart: S1, S2, with a 5-1/7 systolic murmur. Abdomen: Positive bowel sounds, obese, nontender, nondistended. Extremities: 1+ down from 3+ pitting bilateral lower extremity edema, decreased from yesterday. Neurologic: She is alert and oriented x3. Cranial nerves II through XII are grossly intact.   Data Reviewed: Basic Metabolic Panel:  Recent Labs Lab 03/02/14 0556 03/03/14 0536 03/04/14 0515 03/05/14 0551 03/06/14 0550  NA 140 139 139 140 140  K 3.4* 4.0 3.7 3.8 4.2  CL 99 98 97 96 95*  CO2 28 30 33* 33* 35*  GLUCOSE 245* 225* 224* 214* 157*  BUN 23 29* 36* 39* 41*  CREATININE 1.08 1.13* 1.16* 1.12* 1.24*  CALCIUM 8.2* 8.4 8.3* 8.1* 8.1*  MG  --   --  1.7  --   --    Liver Function Tests: No results found for this basename: AST, ALT, ALKPHOS, BILITOT, PROT, ALBUMIN,  in the last 168 hours No results found for this basename: LIPASE, AMYLASE,  in the last 168 hours No results found for this basename: AMMONIA,  in the last 168 hours CBC:  Recent Labs Lab 02/28/14 0438 03/02/14 0556 03/04/14 0515  WBC 4.0 2.3* 5.6  HGB 10.3* 9.9* 10.9*  HCT 33.5* 31.5* 34.4*  MCV 93.8 92.1 90.8  PLT 186 174 274   Cardiac Enzymes:  Recent Labs Lab 02/27/14 1426 02/27/14 2157 02/28/14 0438 02/28/14 0949  TROPONINI 0.43* <0.30 <0.30 0.34*   BNP (last  3 results)  Recent Labs  02/27/14 1101  PROBNP 7765.0*   CBG:  Recent Labs Lab 03/05/14 1122 03/05/14 1626 03/05/14 2012 03/06/14 0743 03/06/14 1135  GLUCAP 382* 79 186* 186* 318*    No results found for this or any previous visit (from the past 240 hour(s)).   Studies: Dg Chest Port 1 View  03/05/2014   CLINICAL DATA:  Chest pain  EXAM: PORTABLE CHEST - 1 VIEW  COMPARISON:  March 03, 2014 and January 30, 2014  FINDINGS: There is patchy atelectasis in both lung bases. Lungs elsewhere clear. Heart is upper normal in size with pulmonary vascularity. No  adenopathy. Port-A-Cath tip is at the cavoatrial junction. No pneumothorax.  IMPRESSION: Patchy bibasilar atelectatic change. Elsewhere lungs clear. Port-A-Cath tip at cavoatrial junction without pneumothorax.   Electronically Signed   By: Lowella Grip M.D.   On: 03/05/2014 18:36    Scheduled Meds: . ALPRAZolam  0.5 mg Oral BID  . aspirin  81 mg Oral Daily  . buPROPion  150 mg Oral BID  . citalopram  20 mg Oral q morning - 10a  . docusate sodium  100 mg Oral BID  . enoxaparin (LOVENOX) injection  60 mg Subcutaneous Q24H  . fentaNYL  100 mcg Transdermal Q72H  . [START ON 03/07/2014] furosemide  40 mg Oral Daily  . gabapentin  300 mg Oral TID  . guaiFENesin  600 mg Oral BID  . insulin aspart  0-20 Units Subcutaneous TID WC  . insulin aspart  0-5 Units Subcutaneous QHS  . ipratropium  0.5 mg Nebulization Q4H  . levalbuterol  0.63 mg Nebulization Q4H  . levofloxacin  750 mg Oral q1800  . levothyroxine  50 mcg Oral QAC breakfast  . methylPREDNISolone (SOLU-MEDROL) injection  80 mg Intravenous Q12H  . metoprolol tartrate  12.5 mg Oral BID  . nystatin  5 mL Oral TID  . pantoprazole  40 mg Oral Daily  . polyethylene glycol  17 g Oral Daily  . potassium chloride  20 mEq Oral BID  . sodium chloride  3 mL Intravenous Q12H   Continuous Infusions:   Principal Problem:   Acute respiratory failure Active Problems:   Acute diastolic heart failure   HCAP (healthcare-associated pneumonia)   Acute renal failure   Non-ST elevation MI (NSTEMI)   Essential hypertension, benign   Paraplegia following spinal cord injury   Hodgkin's lymphoma   Hyperkalemia   Hyperglycemia, drug-induced   Aortic stenosis, mild    Time spent: 25 minutes    [facility] Pager [phone]. If 7PM-7AM, please contact night-coverage at [email or web address], password [email or web address] 03/06/2014, 1:32 PM  LOS: 7 days

## 2014-03-06 NOTE — Progress Notes (Signed)
Pt educated about getting a new IV. Pt refuses new IV. States she hopes to be going home soon anyway. IV flushes and has good blood return.

## 2014-03-07 LAB — BASIC METABOLIC PANEL
Anion gap: 10 (ref 5–15)
BUN: 38 mg/dL — ABNORMAL HIGH (ref 6–23)
CO2: 34 meq/L — AB (ref 19–32)
Calcium: 8.1 mg/dL — ABNORMAL LOW (ref 8.4–10.5)
Chloride: 97 mEq/L (ref 96–112)
Creatinine, Ser: 1.13 mg/dL — ABNORMAL HIGH (ref 0.50–1.10)
GFR calc Af Amer: 55 mL/min — ABNORMAL LOW (ref >90)
GFR calc non Af Amer: 47 mL/min — ABNORMAL LOW (ref >90)
Glucose, Bld: 157 mg/dL — ABNORMAL HIGH (ref 70–99)
Potassium: 4.5 mEq/L (ref 3.7–5.3)
Sodium: 141 mEq/L (ref 137–147)

## 2014-03-07 LAB — CBC
HCT: 37.3 % (ref 36.0–46.0)
Hemoglobin: 12 g/dL (ref 12.0–15.0)
MCH: 29.3 pg (ref 26.0–34.0)
MCHC: 32.2 g/dL (ref 30.0–36.0)
MCV: 91.2 fL (ref 78.0–100.0)
Platelets: 243 10*3/uL (ref 150–400)
RBC: 4.09 MIL/uL (ref 3.87–5.11)
RDW: 15 % (ref 11.5–15.5)
WBC: 9.1 10*3/uL (ref 4.0–10.5)

## 2014-03-07 LAB — GLUCOSE, CAPILLARY
GLUCOSE-CAPILLARY: 336 mg/dL — AB (ref 70–99)
Glucose-Capillary: 156 mg/dL — ABNORMAL HIGH (ref 70–99)
Glucose-Capillary: 266 mg/dL — ABNORMAL HIGH (ref 70–99)
Glucose-Capillary: 234 mg/dL — ABNORMAL HIGH (ref 70–99)

## 2014-03-07 MED ORDER — INSULIN GLARGINE 100 UNIT/ML ~~LOC~~ SOLN
10.0000 [IU] | Freq: Two times a day (BID) | SUBCUTANEOUS | Status: DC
Start: 2014-03-07 — End: 2014-03-08
  Administered 2014-03-07 – 2014-03-08 (×3): 10 [IU] via SUBCUTANEOUS
  Filled 2014-03-07 (×5): qty 0.1

## 2014-03-07 MED ORDER — INSULIN ASPART 100 UNIT/ML ~~LOC~~ SOLN
0.0000 [IU] | Freq: Three times a day (TID) | SUBCUTANEOUS | Status: DC
  Administered 2014-03-07: 5 [IU] via SUBCUTANEOUS
  Administered 2014-03-07: 7 [IU] via SUBCUTANEOUS
  Administered 2014-03-08: 2 [IU] via SUBCUTANEOUS

## 2014-03-07 MED ORDER — PREDNISONE 20 MG PO TABS
60.0000 mg | ORAL_TABLET | Freq: Every day | ORAL | Status: DC
  Administered 2014-03-07: 60 mg via ORAL
  Filled 2014-03-07: qty 3

## 2014-03-07 MED ORDER — INSULIN ASPART 100 UNIT/ML ~~LOC~~ SOLN
0.0000 [IU] | Freq: Every day | SUBCUTANEOUS | Status: DC
  Administered 2014-03-07: 2 [IU] via SUBCUTANEOUS

## 2014-03-07 MED ORDER — PREDNISONE 20 MG PO TABS
60.0000 mg | ORAL_TABLET | Freq: Two times a day (BID) | ORAL | Status: DC
  Administered 2014-03-07 – 2014-03-08 (×2): 60 mg via ORAL
  Filled 2014-03-07 (×3): qty 3

## 2014-03-07 MED ORDER — POTASSIUM CHLORIDE CRYS ER 10 MEQ PO TBCR
10.0000 meq | EXTENDED_RELEASE_TABLET | Freq: Every day | ORAL | Status: AC
  Administered 2014-03-08: 10 meq via ORAL
  Filled 2014-03-07: qty 1

## 2014-03-07 NOTE — Progress Notes (Signed)
[facility] PROGRESS NOTE  [name] [name] DSK:[id number] DOB: 07/23/1941 DOA: 02/27/2014 PCP: [name], MD  Subjective: The patient complains of tremor, particularly after nebulizers. Her tremor has been chronic. She denies chest pain.  Assessment/Plan:  #1 Acute respiratory failure with hypoxia secondary to healthcare associated pneumonia and acute diastolic heart failure. She is improving clinically and radiographically. She is afebrile. Her white blood cell count is within normal limits. We will continue Levaquin, oxygen, and dual nebulizers. IV Lasix was discontinued yesterday afternoon when she developed mild acute renal insufficiency and metabolic alkalosis.Burnis Medin continue oral Lasix. Discussed patient with cardiologist, Dr. Domenic Polite who favors keeping her in the hospital another day to assess her diuresis on oral Lasix only.   Pneumonia (healthcare associated pneumonia) The patient was started on vancomycin and Levaquin on admission. She is now only on Levaquin as the vancomycin was discontinued on 7/21. She is afebrile and her white blood cell count is within normal limits. Legionella and urinary strep antigen were negative. Influenza panel was negative. Her followup chest x-ray on 7/25 revealed improvement.  Acute Bronchitis-  She was started on  Solumedrol 60 mg IV q 6 hrs, duoneb nebulizer q 4 hrs and.Mucinex 1200 mg po BID. Solu-Medrol was discontinued this morning and oral prednisone was started. The patient's bronchospasms/rhonchi or secondary to upper airway congestion from her inability to expectorate. We'll decrease the frequency of nebulizers.  Acute diastolic heart failure. Her 2-D echo revealed grade 1 diastolic dysfunction and ejection fraction of 55-60%. She was started on IV Lasix at 80 mg twice a day, but it was increased to 3 times a day. She has had good diuresis at almost -8 L. She has significantly less peripheral edema. Lasix has been changed to by mouth.  (See discussion #1).  Elevated troponin secondary to non-ST elevation myocardial infarction. This was likely due to demand ischemia. Patient has a history of 3 cardiac catheterizations which revealed nonobstructive CAD and normal LV function. We'll continue aspirin and prophylactic Lovenox. Apparently metoprolol was withheld due to low-normal blood pressures. Cardiology restarted metoprolol on 03/03/14.. Of note, the patient had an episode of chest pain on 7/25. Chest x-ray and EKG were ordered and revealed no new or acute findings. She was treated supportively and has no complaints of chest pain today.  Acute kidney failure, secondary to a hypoperfusion from heart failure. Her creatinine has improved from 1.3 on admission. Her urine output is adequate. Continue to monitor. Will adjust Lasix if it appears that she is becoming intravascularly depleted.  Hyperkalemia. Etiology unclear other than possible contribution from acute renal injury. She was treated successfully with Kayexalate. Her serum potassium has normalized. Will monitor data day to see if she requires ongoing potassium chloride supplementation on oral Lasix.  Hyperglycemia, likely steroid induced. Hemoglobin A1c 5.8. Started sliding scale NovoLog on 7/23. Lantus was added subsequently. Solu-Medrol was decreased which will help her blood sugars.  Hypothyroidism. Continue Synthroid. TSH was within normal limits.  Anemia, likely of chronic disease from lymphoma. Continue to monitor with no further investigation or workup at this time.  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   Code Status: DNR Family Communication: Discussed with patient.  Disposition Plan: Discharge to home when clinically appropriate, possibly in the next 24-48  hours.   Consultants:  Cardiology  Procedures:  Echocardiogram 03/01/2014:  Study Conclusions - Left ventricle: The cavity size was mildly dilated. Wall thickness was normal. Systolic function was normal. The estimated ejection fraction was in the range of 55% to 60%. Doppler parameters are consistent with abnormal left ventricular relaxation (grade 1 diastolic dysfunction). - Aortic valve: Av is thckened, calcified with mildly restricted motion Peak and mean gradients through the valve aer 33 and 19 consistent with mild AS. - Mitral valve: There was mild regurgitation. - Left atrium: The atrium was mildly dilated. - Pulmonary arteries: PA peak pressure: 37 mm Hg (S).    Antibiotics:  Levaquin  HPI/Subjective: 73 y.o. female with paraplegia who is wheelchair-bound, a history of stage IV non-Hodgkin's lymphoma no longer being treated but also not on hospice, hypertension, nonobstructive coronary artery disease, TIA, history of PE in the past and chronic pain.  The patient presented for shortness of breath which she has had for a few weeks, but for the past 4 days prior to admission, she had developed a productive of cream-colored sputum. Her daughter stated that her mother had broken out into a sweat and was found in the ER to have a fever of 100.1. They also noted that she had been gaining weight despite barely eating and her ankles had become more swollen. Her daughter also stated that the patient was being treated for a urinary tract infection and according to her medication list she had received 7 days of IM Rocephin which ended the day before admission. The patient admitted to burning micturition prior to the hospitalization.   Objective: Filed Vitals:   03/07/14 0400  BP: 124/45  Pulse: 79  Temp: 98 F (36.7 C)  Resp: 18   oxygen saturation 92% on nasal cannula oxygen.  Intake/Output Summary (Last 24 hours) at 03/07/14 1147 Last data filed at 03/07/14 1038  Gross per  24 hour  Intake    800 ml  Output   3450 ml  Net  -2650 ml   Filed Weights   03/05/14 0353 03/06/14 0504 03/07/14 0400  Weight: 114.669 kg (252 lb 12.8 oz) 112.946 kg (249 lb) 111.7 kg (246 lb 4.1 oz)    Exam:  Physical Exam: General: Obese 73 year old woman sitting up in bed. No acute distress. Lungs: Few scattered rhonchus wheezes that partially cleared with coughing. Breathing is nonlabored. Heart: S1, S2, with a 2-4/5 systolic murmur. Abdomen: Positive bowel sounds, obese, nontender, nondistended. Extremities: 1+ down from 3+ pitting bilateral lower extremity edema, decreased from yesterday. Neurologic: She is alert and oriented x3. Cranial nerves II through XII are grossly intact.   Data Reviewed: Basic Metabolic Panel:  Recent Labs Lab 03/03/14 0536 03/04/14 0515 03/05/14 0551 03/06/14 0550 03/07/14 0635  NA 139 139 140 140 141  K 4.0 3.7 3.8 4.2 4.5  CL 98 97 96 95* 97  CO2 30 33* 33* 35* 34*  GLUCOSE 225* 224* 214* 157* 157*  BUN 29* 36* 39* 41* 38*  CREATININE 1.13* 1.16* 1.12* 1.24* 1.13*  CALCIUM 8.4 8.3* 8.1* 8.1* 8.1*  MG  --  1.7  --   --   --    Liver Function Tests: No results found for this basename: AST, ALT, ALKPHOS, BILITOT, PROT, ALBUMIN,  in the last 168 hours No results found for this basename: LIPASE, AMYLASE,  in the last 168 hours No results found for this basename: AMMONIA,  in the last 168 hours CBC:  Recent Labs Lab 03/02/14 0556 03/04/14 0515 03/07/14 0635  WBC 2.3* 5.6 9.1  HGB 9.9*  10.9* 12.0  HCT 31.5* 34.4* 37.3  MCV 92.1 90.8 91.2  PLT 174 274 243   Cardiac Enzymes: No results found for this basename: CKTOTAL, CKMB, CKMBINDEX, TROPONINI,  in the last 168 hours BNP (last 3 results)  Recent Labs  02/27/14 1101  PROBNP 7765.0*   CBG:  Recent Labs Lab 03/06/14 1135 03/06/14 1634 03/06/14 2009 03/07/14 0743 03/07/14 1119  GLUCAP 318* 237* 167* 156* 336*    No results found for this or any previous visit (from  the past 240 hour(s)).   Studies: Dg Chest Port 1 View  03/05/2014   CLINICAL DATA:  Chest pain  EXAM: PORTABLE CHEST - 1 VIEW  COMPARISON:  March 03, 2014 and January 30, 2014  FINDINGS: There is patchy atelectasis in both lung bases. Lungs elsewhere clear. Heart is upper normal in size with pulmonary vascularity. No adenopathy. Port-A-Cath tip is at the cavoatrial junction. No pneumothorax.  IMPRESSION: Patchy bibasilar atelectatic change. Elsewhere lungs clear. Port-A-Cath tip at cavoatrial junction without pneumothorax.   Electronically Signed   By: Lowella Grip M.D.   On: 03/05/2014 18:36    Scheduled Meds: . ALPRAZolam  0.5 mg Oral BID  . aspirin  81 mg Oral Daily  . buPROPion  150 mg Oral BID  . citalopram  20 mg Oral q morning - 10a  . docusate sodium  100 mg Oral BID  . enoxaparin (LOVENOX) injection  60 mg Subcutaneous Q24H  . fentaNYL  100 mcg Transdermal Q72H  . furosemide  40 mg Oral Daily  . gabapentin  300 mg Oral TID  . guaiFENesin  600 mg Oral BID  . insulin aspart  0-5 Units Subcutaneous QHS  . insulin aspart  0-9 Units Subcutaneous TID WC  . ipratropium  0.5 mg Nebulization Q4H  . levalbuterol  0.63 mg Nebulization Q4H  . levofloxacin  750 mg Oral q1800  . levothyroxine  50 mcg Oral QAC breakfast  . metoprolol tartrate  12.5 mg Oral BID  . nystatin  5 mL Oral TID  . pantoprazole  40 mg Oral Daily  . polyethylene glycol  17 g Oral Daily  . potassium chloride  20 mEq Oral BID  . predniSONE  60 mg Oral BID WC  . sodium chloride  3 mL Intravenous Q12H   Continuous Infusions:   Principal Problem:   Acute respiratory failure Active Problems:   Acute diastolic heart failure   HCAP (healthcare-associated pneumonia)   Acute renal failure   Non-ST elevation MI (NSTEMI)   Essential hypertension, benign   Paraplegia following spinal cord injury   Hodgkin's lymphoma   Hyperkalemia   Hyperglycemia, drug-induced   Aortic stenosis, mild    Time spent: 25  minutes    [facility] Pager [phone]. If 7PM-7AM, please contact night-coverage at [email or web address], password [facility] 03/07/2014, 11:47 AM  LOS: 8 days

## 2014-03-07 NOTE — Progress Notes (Signed)
Patients sats on RA remain 89% after being off O2 for one hour.

## 2014-03-07 NOTE — Progress Notes (Signed)
Patients O2 sats on RA are 91%.  Dropped to 89 % just briefly before returning to 91%.

## 2014-03-07 NOTE — Progress Notes (Signed)
Patient seen and examined. Agree with assessment by Ms. Bonnell Public PA-C. Discussed with patient, son at bedside, and Dr. Caryn Section. She has had a significant diuresis during stay and is feeling better in general. Agree with change to oral Lasix. Would aim for discharge in AM to ensure adequate diuresis on oral medication and allow for adjustment if needed. We will arrange followup with Dr. Harl Bowie.  Satira Sark, M.D., F.A.C.C.

## 2014-03-07 NOTE — Progress Notes (Signed)
Subjective:  Complains of sore throat and headache from Oxygen. Still coughing.   Objective:  Vital Signs in the last 24 hours: Temp:  [98 F (36.7 C)-98.2 F (36.8 C)] 98 F (36.7 C) (07/27 0400) Pulse Rate:  [79-96] 79 (07/27 0400) Resp:  [18] 18 (07/27 0400) BP: (115-128)/(45-58) 124/45 mmHg (07/27 0400) SpO2:  [89 %-95 %] 95 % (07/27 0400) Weight:  [246 lb 4.1 oz (111.7 kg)] 246 lb 4.1 oz (111.7 kg) (07/27 0400)  Intake/Output from previous day: 07/26 0701 - 07/27 0700 In: 1050 [P.O.:1050] Out: 2600 [Urine:2600] Intake/Output from this shift: Total I/O In: -  Out: 450 [Urine:450]  Physical Exam: NECK: Without JVD, HJR, or bruit LUNGS: Decreased breath sounds with scattered crackles and rhonchi HEART: Regular rate and rhythm, no murmur, gallop, rub, bruit, thrill, or heave EXTREMITIES:edema improving, but still has degree of anasarca  Lab Results:  Recent Labs  03/07/14 0635  WBC 9.1  HGB 12.0  PLT 243    Recent Labs  03/06/14 0550 03/07/14 0635  NA 140 141  K 4.2 4.5  CL 95* 97  CO2 35* 34*  GLUCOSE 157* 157*  BUN 41* 38*  CREATININE 1.24* 1.13*   No results found for this basename: TROPONINI, CK, MB,  in the last 72 hours Hepatic Function Panel No results found for this basename: PROT, ALBUMIN, AST, ALT, ALKPHOS, BILITOT, BILIDIR, IBILI,  in the last 72 hours No results found for this basename: CHOL,  in the last 72 hours No results found for this basename: PROTIME,  in the last 72 hours    Cardiac Studies:Imaging: Echocardiogram 03/01/2014: Study Conclusions  - Left ventricle: The cavity size was mildly dilated. Wall thickness was normal. Systolic function was normal. The estimated ejection fraction was in the range of 55% to 60%. Doppler parameters are consistent with abnormal left ventricular relaxation (grade 1 diastolic dysfunction). - Aortic valve: Av is thckened, calcified with mildly restricted motion Peak and mean gradients through  the valve aer 33 and 19 consistent with mild AS. - Mitral valve: There was mild regurgitation. - Left atrium: The atrium was mildly dilated. - Pulmonary arteries: PA peak pressure: 37 mm Hg (S).   Assessment/Plan:  Acute pulmonary edema with anasarca secondary to diastolic heart failure:  EF 55-60%. Diuresed over 17,000cc since admission. Weights unreliable. Diuretics decreased and changed to 40 mg po daily over the weekend because of woresening renal function. She hasn't received this dose yet. Suspect she will need Lasix 40 mg BID at discharge given her size. Will schedule f/u with Dr. Harl Bowie after discharge.   NSTEMI: most likely secondary to volume overload. 3 prior caths last 2008 nonobstructive CAD. No EKG changes.  Pneumonia and Bronchitis: treated Non-Hodgkin's lymphoma  History of lung cancer treated with chemotherapy  History of TIAs previously on Coumadin  History of pulmonary embolus   Hypertension  Hold meds since bp low while diuresing    History of chest pain with 3 cardiac catheterizations revealing nonobstructive CAD and normal LV function last one in 2008.  Acute on Chronic renal insufficiency better today  Paraplegic    LOS: 8 days    [name] [name] 03/07/2014, 7:55 AM

## 2014-03-08 LAB — GLUCOSE, CAPILLARY
GLUCOSE-CAPILLARY: 105 mg/dL — AB (ref 70–99)
Glucose-Capillary: 197 mg/dL — ABNORMAL HIGH (ref 70–99)

## 2014-03-08 LAB — BASIC METABOLIC PANEL
ANION GAP: 11 (ref 5–15)
BUN: 35 mg/dL — AB (ref 6–23)
CHLORIDE: 98 meq/L (ref 96–112)
CO2: 31 mEq/L (ref 19–32)
CREATININE: 1.04 mg/dL (ref 0.50–1.10)
Calcium: 8.2 mg/dL — ABNORMAL LOW (ref 8.4–10.5)
GFR calc Af Amer: 61 mL/min — ABNORMAL LOW (ref >90)
GFR calc non Af Amer: 52 mL/min — ABNORMAL LOW (ref >90)
Glucose, Bld: 148 mg/dL — ABNORMAL HIGH (ref 70–99)
Potassium: 4 mEq/L (ref 3.7–5.3)
Sodium: 140 mEq/L (ref 137–147)

## 2014-03-08 MED ORDER — FUROSEMIDE 20 MG PO TABS: 60.0000 mg | ORAL_TABLET | Freq: Every day | ORAL | Status: DC

## 2014-03-08 MED ORDER — IPRATROPIUM-ALBUTEROL 0.5-2.5 (3) MG/3ML IN SOLN: RESPIRATORY_TRACT | Status: DC

## 2014-03-08 MED ORDER — OXYCODONE HCL 5 MG PO TABS
10.0000 mg | ORAL_TABLET | Freq: Once | ORAL | Status: AC
  Administered 2014-03-08: 10 mg via ORAL
  Filled 2014-03-08: qty 2

## 2014-03-08 MED ORDER — LEVOFLOXACIN 750 MG PO TABS: 750.0000 mg | ORAL_TABLET | Freq: Every day | ORAL | Status: DC

## 2014-03-08 MED ORDER — GUAIFENESIN ER 600 MG PO TB12: 600.0000 mg | ORAL_TABLET | Freq: Two times a day (BID) | ORAL | Status: AC

## 2014-03-08 MED ORDER — FUROSEMIDE 20 MG PO TABS: 60.0000 mg | ORAL_TABLET | Freq: Every day | ORAL | Status: AC

## 2014-03-08 MED ORDER — LEVOFLOXACIN 750 MG PO TABS: 750.0000 mg | ORAL_TABLET | Freq: Every day | ORAL | Status: AC

## 2014-03-08 MED ORDER — FUROSEMIDE 40 MG PO TABS
60.0000 mg | ORAL_TABLET | Freq: Every day | ORAL | Status: DC
  Administered 2014-03-08: 60 mg via ORAL
  Filled 2014-03-08 (×2): qty 1

## 2014-03-08 MED ORDER — METOPROLOL SUCCINATE ER 25 MG PO TB24: 25.0000 mg | ORAL_TABLET | Freq: Every day | ORAL | Status: AC

## 2014-03-08 MED ORDER — POTASSIUM CHLORIDE CRYS ER 20 MEQ PO TBCR: 10.0000 meq | EXTENDED_RELEASE_TABLET | Freq: Every day | ORAL | Status: AC

## 2014-03-08 MED ORDER — PREDNISONE 10 MG PO TABS: ORAL_TABLET | ORAL | Status: AC

## 2014-03-08 MED ORDER — METOPROLOL SUCCINATE ER 25 MG PO TB24
25.0000 mg | ORAL_TABLET | Freq: Every day | ORAL | Status: DC
  Administered 2014-03-08: 25 mg via ORAL
  Filled 2014-03-08: qty 1

## 2014-03-08 MED ORDER — IPRATROPIUM-ALBUTEROL 0.5-2.5 (3) MG/3ML IN SOLN: RESPIRATORY_TRACT | Status: AC

## 2014-03-08 MED ORDER — ASPIRIN 81 MG PO CHEW: 81.0000 mg | CHEWABLE_TABLET | Freq: Every day | ORAL | Status: AC

## 2014-03-08 NOTE — Discharge Summary (Signed)
Physician Discharge Summary  [name] [name] LOV:[id number] DOB: [birth year] DOA: 02/27/2014  PCP: [name], MD  Admit date: 02/27/2014 Discharge date: 03/08/2014  Time spent: greater than 30 minutes  Recommendations for Outpatient Follow-up:  1. Assess fluid status and renal function.  2. Of note, the patient has chronic upper airway crackles from difficulty clearing secretions.  Discharge Diagnoses:  1.  Acute respiratory failure with hypoxia secondary to HCAP and diastolic dysfunction. ---02 saturation 87% on room air at rest on 03/07/14: home 02 was prescribed 2. Acute diastolic heart failure. ----diuresed a negative 17.5 liters ----weight 246 lbs at discharge 3.HCAP (healthcare-associated pneumonia) 4. Acute bronchitis with chronic upper airway crackles from difficulty clearing secretions. 5.CAD with Non-ST elevation MI (NSTEMI) 6. Mild aortic stenosis 7. Acute renal failure 8.Essential hypertension, benign 9.Hyperkalemia 10. Hyperglycemia, drug-induced 11 Paraplegia following spinal cord injury 12.Hodgkin's lymphoma   Discharge Condition: Improved  Diet recommendation: Heart Healthy  Filed Weights   03/06/14 0504 03/07/14 0400 03/08/14 0500  Weight: 112.946 kg (249 lb) 111.7 kg (246 lb 4.1 oz) 111.7 kg (246 lb 4.1 oz)    History of present illness:   HPI: [name] [name] is a 73 y.o. female with paraplegia who is wheelchair-bound, with a history of stage IV non-Hodgkin's lymphoma no longer being treated but also not on hospice, hypertension, nonobstructive coronary artery disease, TIA, history of PE in the past and chronic pain.  The patient presented for shortness of breath. She had a cough for a few weeks. She had subjective fevers. Her family also noted that she had been gaining weight despite barely eating and her ankles were becoming more swollen. The daughter also stated that the patient was being treated for a urinary tract infection and according to her  medication list had received 7 days of IM Rocephin which ended the day before admission. In the ED, she was noted to be febrile and have pulmonary edema and possible underlying pneumonia versus a bronchitis;  acute kidney failure and hyperkalemia.       Hospital Course:    #1 Acute respiratory failure with hypoxia secondary to healthcare associated pneumonia and acute diastolic heart failure.  The patient was started on IV Levaquin, IV Vancomycin, oxygen, IV lasix, and supportive treatment. Cardiology was consulted and assisted with management of diastolic heart failure. She improved clinically and radiographically. She became afebrile and her white blood cell count normalized. She diuresed over 17 liters of fluid.Her 02 saturation waxed and waned but was measured at 87% on room air at rest on 03/07/14, so home oxygen was prescribed. She was discharged on lasix and a few more days of Levaquin.  Pneumonia (healthcare associated pneumonia)  The patient was started on vancomycin and Levaquin on admission. Vancomycin was discontinued after a few days. Legionella and urinary strep antigen were negative. Influenza panel was negative. Her followup chest x-ray on 7/25 revealed improvement. She was discharged on a few more days of Levaquin. Acute Bronchitis-  She was started on Solumedrol 60 mg IV q 6 hrs, duoneb nebulizer q 4 hrs and.Mucinex 1200 mg po BID. Solu-Medrol was discontinued and oral prednisone was started. Much of the patient's bronchospasms/rhonchi were secondary to upper airway congestion from her inability to expectorate. She was encouraged to sit up more and cough and use the incentive spirometry. She was discharged on Duoneb and a prednisone taper.  Acute diastolic heart failure.  Her 2-D echo revealed grade 1 diastolic dysfunction and an ejection fraction of 55-60%. It also revealed  mild aortic stenosis. She was started on IV Lasix at 80 mg twice a day, but it was increased to 3 times a day  by cardiology. Lasix was eventually titrated down. She had excellent diuresis of -17.5 liters and achieved significantly less leg edema. Her chest ray showed improvement. She was discharged on daily lasix. Elevated troponin secondary to non-ST elevation myocardial infarction.  Her troponin I was elevated likely due to demand ischemia. Patient has a history of 3 cardiac catheterizations which revealed nonobstructive CAD and normal LV function. Cardiology assisted with management. She was treated medically and discharged on aspirin, Imdur, and Toprol XL.   Acute kidney failure, secondary to a hypoperfusion from heart failure.  Her creatinine was elevated on admission, but improved with diuresis. It trended upward again as she was approaching intravascularly depletion.Lasix dosing was decreased. Her creatinine improved to 1.04 at the time of discharge. Hyperkalemia on admission .  Etiology was unclear other than possible contribution from acute renal injury. She was treated successfully with Kayexalate. Her serum potassium has normalized. Later she required ongoing potassium chloride supplementation on  Lasix.  Hyperglycemia, likely steroid induced.  Hemoglobin A1c was 5.8. Started sliding scale NovoLog and. Lantus were given.  Hypothyroidism.  She was continued on Synthroid. TSH was within normal limits.  Anemia, likely of chronic disease from lymphoma.  Her H/H remained more or less stable.  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    Procedures: Echocardiogram 03/01/2014:  Study Conclusions - Left ventricle: The cavity size was mildly dilated. Wall thickness was normal. Systolic function was normal. The estimated ejection fraction was in the range of 55% to 60%. Doppler parameters are consistent with abnormal left  ventricular relaxation (grade 1 diastolic dysfunction). - Aortic valve: Av is thckened, calcified with mildly restricted motion Peak and mean gradients through the valve aer 33 and 19 consistent with mild AS. - Mitral valve: There was mild regurgitation. - Left atrium: The atrium was mildly dilated. - Pulmonary arteries: PA peak pressure: 37 mm Hg (S).   Consultations:  Cardiology  Discharge Exam: Filed Vitals:   03/08/14 1000  BP: 143/58  Pulse: 88  Temp:   Resp:   Temp Afebrile. 02 sats 95% on 2 liters 02.  Physical Exam: General: Obese 73 year old woman sitting up in bed. No acute distress. Lungs: Few scattered upper airway rhonchus wheezes that partially cleared with coughing. Breathing is nonlabored. Heart: S1, S2, with a 9-9/8 systolic murmur. Abdomen: Positive bowel sounds, obese, nontender, nondistended. Extremities: 1+ down from 3+ pitting bilateral lower extremity edema, decreased from yesterday. Neurologic: She is alert and oriented x3. Cranial nerves II through XII are grossly intact.   Discharge Instructions You were cared for by a hospitalist during your hospital stay. If you have any questions about your discharge medications or the care you received while you were in the hospital after you are discharged, you can call the unit and asked to speak with the hospitalist on call if the hospitalist that took care of you is not available. Once you are discharged, your primary care physician will handle any further medical issues. Please note that NO REFILLS for any discharge medications will be authorized once you are discharged, as it is imperative that you return to your primary care physician (or establish a relationship with a primary care physician if you do not  have one) for your aftercare needs so that they can reassess your need for medications and monitor your lab values.  Discharge Instructions   Diet - low sodium heart healthy    Complete by:  As directed       Discharge instructions    Complete by:  As directed   Limited your sugar intake while on prednisone.     Increase activity slowly    Complete by:  As directed             Medication List    STOP taking these medications       metoprolol tartrate 25 MG tablet  Commonly known as:  LOPRESSOR      TAKE these medications       albuterol-ipratropium 18-103 MCG/ACT inhaler  Commonly known as:  COMBIVENT  Inhale 1 puff into the lungs every 6 (six) hours as needed for wheezing or shortness of breath.     ipratropium-albuterol 0.5-2.5 (3) MG/3ML Soln  Commonly known as:  DUONEB  Use 3 times daily and then every 2 hours as needed.     ALPRAZolam 0.5 MG tablet  Commonly known as:  XANAX  Take 0.5 mg by mouth 2 (two) times daily. For anxiety     aspirin 81 MG chewable tablet  Chew 1 tablet (81 mg total) by mouth daily.     buPROPion 150 MG 12 hr tablet  Commonly known as:  WELLBUTRIN SR  Take 150 mg by mouth 2 (two) times daily.     calcium-vitamin D 500-200 MG-UNIT per tablet  Commonly known as:  OSCAL WITH D  Take 1 tablet by mouth 2 (two) times daily.     cholecalciferol 1000 UNITS tablet  Commonly known as:  VITAMIN D  Take 1,000 Units by mouth daily.     citalopram 20 MG tablet  Commonly known as:  CELEXA  Take 20 mg by mouth every morning.     docusate sodium 100 MG capsule  Commonly known as:  COLACE  Take 100 mg by mouth 2 (two) times daily.     enoxaparin 40 MG/0.4ML injection  Commonly known as:  LOVENOX  Inject 0.4 mLs (40 mg total) into the skin daily.     fentaNYL 100 MCG/HR  Commonly known as:  DURAGESIC - dosed mcg/hr  Place 1 patch onto the skin every 3 (three) days.     ferrous sulfate 325 (65 FE) MG tablet  Take 325 mg by mouth daily with breakfast.     furosemide 20 MG tablet  Commonly known as:  LASIX  Take 3 tablets (60 mg total) by mouth daily. Dosed increased to treat swelling.     gabapentin 300 MG capsule  Commonly known as:  NEURONTIN   Take 1 capsule by mouth 3 (three) times daily.     guaiFENesin 600 MG 12 hr tablet  Commonly known as:  MUCINEX  Take 1 tablet (600 mg total) by mouth 2 (two) times daily.     isosorbide mononitrate 30 MG 24 hr tablet  Commonly known as:  IMDUR  Take 30 mg by mouth every morning.     levofloxacin 750 MG tablet  Commonly known as:  LEVAQUIN  Take 1 tablet (750 mg total) by mouth daily at 6 PM. Antibiotic to take for 3 more days.     levothyroxine 50 MCG tablet  Commonly known as:  SYNTHROID, LEVOTHROID  Take 50 mcg by mouth daily before breakfast.     LORazepam 0.5  MG tablet  Commonly known as:  ATIVAN  Take 0.5 mg by mouth 3 (three) times daily as needed for anxiety.     metoprolol succinate 25 MG 24 hr tablet  Commonly known as:  TOPROL-XL  Take 1 tablet (25 mg total) by mouth daily. New medication for your heart and blood pressure     multivitamins ther. w/minerals Tabs tablet  Take 1 tablet by mouth daily.     ondansetron 4 MG tablet  Commonly known as:  ZOFRAN  Take 4 mg by mouth every 4 (four) hours as needed for nausea or vomiting.     oxyCODONE-acetaminophen 5-325 MG per tablet  Commonly known as:  PERCOCET/ROXICET  Take 1 tablet by mouth every 6 (six) hours as needed for severe pain.     pantoprazole 40 MG tablet  Commonly known as:  PROTONIX  Take 40 mg by mouth daily.     potassium chloride SA 20 MEQ tablet  Commonly known as:  K-DUR,KLOR-CON  Take 0.5 tablets (10 mEq total) by mouth daily.     predniSONE 10 MG tablet  Commonly known as:  DELTASONE  Starting tomorrow on 03/09/14 take 6 tablets daily for one day; then 5 tablets next day; then 4 tablets next day; then 3 tablets next day; then 2 tablets next day; then 1 tablet the next day; then stop.     vitamin B-12 1000 MCG tablet  Commonly known as:  CYANOCOBALAMIN  Take 1,000 mcg by mouth every morning.     vitamin C 500 MG tablet  Commonly known as:  ASCORBIC ACID  Take 500 mg by mouth daily. For 2  weeks.     zinc gluconate 50 MG tablet  Take 50 mg by mouth daily.       Allergies  Allergen Reactions  . Bee Venom Hives  . Codeine Palpitations    Per patient, "Feels like I'm having a heart attack."       Follow-up Information   Follow up with [facility].   Contact information:   [address] [place] [address] [phone]       Follow up with Arnoldo Lenis, MD. (Cardiologist's office will acll with the f/up appt.)    Specialty:  Cardiology   Contact information:   [address] [place] [address] [phone]       Schedule an appointment as soon as possible for a visit with TAPPER,DAVID B, MD.   Specialty:  Family Medicine   Contact information:   [address] [place] [address] [phone]        The results of significant diagnostics from this hospitalization (including imaging, microbiology, ancillary and laboratory) are listed below for reference.    Significant Diagnostic Studies: Dg Chest Port 1 View  03/05/2014   CLINICAL DATA:  Chest pain  EXAM: PORTABLE CHEST - 1 VIEW  COMPARISON:  March 03, 2014 and January 30, 2014  FINDINGS: There is patchy atelectasis in both lung bases. Lungs elsewhere clear. Heart is upper normal in size with pulmonary vascularity. No adenopathy. Port-A-Cath tip is at the cavoatrial junction. No pneumothorax.  IMPRESSION: Patchy bibasilar atelectatic change. Elsewhere lungs clear. Port-A-Cath tip at cavoatrial junction without pneumothorax.   Electronically Signed   By: Lowella Grip M.D.   On: 03/05/2014 18:36   Dg Chest Port 1 View  03/03/2014   CLINICAL DATA:  Pneumonia and CHF. Shortness of breath. Mild chest pressure.  EXAM:  PORTABLE CHEST - 1 VIEW  COMPARISON:  02/26/2014  FINDINGS: Right jugular Port-A-Cath remains in place with tip overlying the lower SVC, unchanged. Cardiac silhouette remains mildly enlarged. The lungs are hypoinflated with increased elevation of the  right hemidiaphragm. There is mild pulmonary vascular congestion. Mild interstitial edema on the prior study has improved. There is no evidence of confluent airspace opacity, pleural effusion, or pneumothorax. Multiple surgical clips are noted in the left upper abdomen.  IMPRESSION: Shallow inspiration with increased elevation the right hemidiaphragm. Mild pulmonary vascular congestion with improvement of mild interstitial edema.   Electronically Signed   By: Logan Bores   On: 03/03/2014 14:55   Dg Chest Portable 1 View  02/27/2014   CLINICAL DATA:  Shortness of breath.  EXAM: PORTABLE CHEST - 1 VIEW  COMPARISON:  Portable mobile chest x-ray 02/20/2014. Portable chest 02/15/2014, 01/30/2014 [facility].  FINDINGS: Suboptimal inspiration due to body habitus accounts for crowded bronchovascular markings, especially in the bases, and accentuates the cardiac silhouette. Taking this into account, cardiac silhouette moderately enlarged but stable. Dense mitral annular calcification. Interval development of mild diffuse interstitial pulmonary edema. No confluent airspace consolidation. Right jugular Port-A-Cath tip projects over the mid to lower SVC.  IMPRESSION: Suboptimal inspiration. Mild CHF, with moderate cardiomegaly and mild diffuse interstitial pulmonary edema.   Electronically Signed   By: Evangeline Dakin M.D.   On: 02/27/2014 12:14    Microbiology: No results found for this or any previous visit (from the past 240 hour(s)).   Labs: Basic Metabolic Panel:  Recent Labs Lab 03/04/14 0515 03/05/14 0551 03/06/14 0550 03/07/14 0635 03/08/14 0619  NA 139 140 140 141 140  K 3.7 3.8 4.2 4.5 4.0  CL 97 96 95* 97 98  CO2 33* 33* 35* 34* 31  GLUCOSE 224* 214* 157* 157* 148*  BUN 36* 39* 41* 38* 35*  CREATININE 1.16* 1.12* 1.24* 1.13* 1.04  CALCIUM 8.3* 8.1* 8.1* 8.1* 8.2*  MG 1.7  --   --   --   --    Liver Function Tests: No results found for this basename: AST, ALT, ALKPHOS,  BILITOT, PROT, ALBUMIN,  in the last 168 hours No results found for this basename: LIPASE, AMYLASE,  in the last 168 hours No results found for this basename: AMMONIA,  in the last 168 hours CBC:  Recent Labs Lab 03/02/14 0556 03/04/14 0515 03/07/14 0635  WBC 2.3* 5.6 9.1  HGB 9.9* 10.9* 12.0  HCT 31.5* 34.4* 37.3  MCV 92.1 90.8 91.2  PLT 174 274 243   Cardiac Enzymes: No results found for this basename: CKTOTAL, CKMB, CKMBINDEX, TROPONINI,  in the last 168 hours BNP: BNP (last 3 results)  Recent Labs  02/27/14 1101  PROBNP 7765.0*   CBG:  Recent Labs Lab 03/07/14 1119 03/07/14 1600 03/07/14 2039 03/08/14 0716 03/08/14 1133  GLUCAP 336* 266* 234* 197* 105*       Signed:  Bertis Hustead  [facility] 03/08/2014, 1:29 PM

## 2014-03-08 NOTE — Progress Notes (Addendum)
AVS reviewed with patient and patient's daughter.  Verbalized understanding of discharge instructions, physician follow-up, medications, heart failure, and home health.  [facility] to deliver oxygen for home use.  Patient and patient's daughter wanted to be discharge home and have [facility] deliver the oxygen to her home.  CSM notified [facility] that patient wanted O2 delivered to home.  Patient's daughter stated she will call [facility] when patient gets home to notify that they can deliver O2.  Patient's IV removed.  Site WNL.  Pt transported by EMS to patient's home.  Patient stable at time of discharge.

## 2014-03-08 NOTE — Progress Notes (Signed)
Primary cardiologist: Dr. Carlyle Dolly (visit pending)  Subjective:   Clinically improved. No chest pain or shortness of breath at rest.   Objective:   Temp:  [97.8 F (36.6 C)-98.3 F (36.8 C)] 97.8 F (36.6 C) (07/28 0439) Pulse Rate:  [73-97] 73 (07/28 0439) Resp:  [18-20] 20 (07/28 0439) BP: (116-157)/(53-56) 116/53 mmHg (07/28 0439) SpO2:  [87 %-97 %] 97 % (07/28 0714) FiO2 (%):  [21 %] 21 % (07/28 0714) Weight:  [246 lb 4.1 oz (111.7 kg)] 246 lb 4.1 oz (111.7 kg) (07/28 0500) Last BM Date: 03/06/14  Filed Weights   03/06/14 0504 03/07/14 0400 03/08/14 0500  Weight: 249 lb (112.946 kg) 246 lb 4.1 oz (111.7 kg) 246 lb 4.1 oz (111.7 kg)    Intake/Output Summary (Last 24 hours) at 03/08/14 0834 Last data filed at 03/07/14 1935  Gross per 24 hour  Intake    600 ml  Output    650 ml  Net    -50 ml    Telemetry: Sinus rhythm.  Exam:  General: Morbidly obese, no distress.  Lungs: Decreased breath sounds with scattered rhonchi.  Cardiac: RRR, no gallop, 6-5/7 systolic murmur at the base.  Abdomen: Obese, nontender.  Extremities: 1+, chronic appearing edema.   Lab Results:  Basic Metabolic Panel:  Recent Labs Lab 03/04/14 0515  03/06/14 0550 03/07/14 0635 03/08/14 0619  NA 139  < > 140 141 140  K 3.7  < > 4.2 4.5 4.0  CL 97  < > 95* 97 98  CO2 33*  < > 35* 34* 31  GLUCOSE 224*  < > 157* 157* 148*  BUN 36*  < > 41* 38* 35*  CREATININE 1.16*  < > 1.24* 1.13* 1.04  CALCIUM 8.3*  < > 8.1* 8.1* 8.2*  MG 1.7  --   --   --   --   < > = values in this interval not displayed.   CBC:  Recent Labs Lab 03/02/14 0556 03/04/14 0515 03/07/14 0635  WBC 2.3* 5.6 9.1  HGB 9.9* 10.9* 12.0  HCT 31.5* 34.4* 37.3  MCV 92.1 90.8 91.2  PLT 174 274 243    TSH 2.8   Echocardiogram 03/01/2014: Study Conclusions  - Left ventricle: The cavity size was mildly dilated. Wall thickness was normal. Systolic function was normal. The estimated ejection  fraction was in the range of 55% to 60%. Doppler parameters are consistent with abnormal left ventricular relaxation (grade 1 diastolic dysfunction). - Aortic valve: Av is thckened, calcified with mildly restricted motion Peak and mean gradients through the valve aer 33 and 19 consistent with mild AS. - Mitral valve: There was mild regurgitation. - Left atrium: The atrium was mildly dilated. - Pulmonary arteries: PA peak pressure: 37 mm Hg (S).    Medications:   Scheduled Medications: . ALPRAZolam  0.5 mg Oral BID  . aspirin  81 mg Oral Daily  . buPROPion  150 mg Oral BID  . citalopram  20 mg Oral q morning - 10a  . docusate sodium  100 mg Oral BID  . enoxaparin (LOVENOX) injection  60 mg Subcutaneous Q24H  . fentaNYL  100 mcg Transdermal Q72H  . furosemide  40 mg Oral Daily  . gabapentin  300 mg Oral TID  . guaiFENesin  600 mg Oral BID  . insulin aspart  0-5 Units Subcutaneous QHS  . insulin aspart  0-9 Units Subcutaneous TID WC  . insulin glargine  10 Units Subcutaneous BID  .  ipratropium  0.5 mg Nebulization Q4H  . levalbuterol  0.63 mg Nebulization Q4H  . levofloxacin  750 mg Oral q1800  . levothyroxine  50 mcg Oral QAC breakfast  . metoprolol tartrate  12.5 mg Oral BID  . pantoprazole  40 mg Oral Daily  . polyethylene glycol  17 g Oral Daily  . potassium chloride  10 mEq Oral Daily  . predniSONE  60 mg Oral BID WC  . sodium chloride  3 mL Intravenous Q12H    PRN Medications: sodium chloride, acetaminophen, acetaminophen, albuterol, alum & mag hydroxide-simeth, LORazepam, ondansetron (ZOFRAN) IV, ondansetron, sodium chloride   Assessment:   1. Acute diastolic heart failure, LVEF 55-60% with grade 1 diastolic dysfunction. Has diuresed well, over 17 L, with significant weight loss and clinical improvement. Now on oral Lasix.  2. Mild aortic stenosis, unlikely of clinical significance at this time.  3. NSTEMI, likely type II event in the setting of volume overload  and diastolic heart failure. For medical therapy without further ischemic testing at this time. Prior cardiac catheterization demonstrated minimal coronary plaque 2008.  4. Acute renal insufficiency, resolved.   Plan/Discussion:    Patient stable for discharge from a cardiac perspective. Outpatient regimen to include aspirin, Lasix at 60 mg once daily, Toprol-XL 25 mg once daily, KCl 10 mEq daily. We will arrange followup with Dr. Harl Bowie or Ms. [place] NP in the next 7-10 days.  Satira Sark, M.D., F.A.C.C.

## 2014-03-08 NOTE — Clinical Social Work Note (Signed)
Pt requesting transport home via [facility]. Pt would qualify per RN. CSW verified address with pt and that family would be present upon arrival. No other needs reported.  Benay Pike, [place]

## 2014-03-08 NOTE — Progress Notes (Signed)
Late Entry:  Patients O2 sat while at rest on RA yesterday morning 87% per [place] with RT.

## 2014-03-09 NOTE — Progress Notes (Signed)
UR chart review completed.  

## 2014-03-14 ENCOUNTER — Encounter: Payer: PRIVATE HEALTH INSURANCE | Admitting: Cardiology

## 2014-03-16 ENCOUNTER — Encounter: Payer: PRIVATE HEALTH INSURANCE | Admitting: Cardiology

## 2014-03-18 ENCOUNTER — Encounter: Payer: PRIVATE HEALTH INSURANCE | Admitting: Cardiology

## 2014-03-31 ENCOUNTER — Ambulatory Visit: Payer: PRIVATE HEALTH INSURANCE | Admitting: Cardiology

## 2014-03-31 NOTE — Progress Notes (Unsigned)
Clinical Summary [name] [name] is a 73 y.o.female former patient of Dr Dannielle Burn, this is our first visit together. She is seen for the following medical problems.   1. Atypical chest pain  - long historyof chest pain, prior false positive Cardiolite with  non-obstructive CAD by cath in 2008. There is brief mention of other caths but I do not see these reports.  - pain thought previously to be GI related or potentially fibromyalgia.   2. TIA  - on coumadin   3. Cellulitis  - recent admission at [facility] 08/2013 with pain and swelling in legs.  - treated with diuretics and antibiotics. Diureses nearly 8 liters, her home lasix was changed to torsemide at discharge.   4. 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 Non Hodgkin's lymphoma   . Other chronic pain   . Esophageal reflux   . Unspecified essential hypertension   . Personal history of pulmonary embolism   . Coronary atherosclerosis of native coronary artery     CATHETERIZATION X 3  . Dysthymic disorder   . Morbid obesity   . Fibromyalgia   . GERD (gastroesophageal reflux disease)   . Anxiety   . Myocardial infarction   . TIA (transient ischemic attack)   . Complication of anesthesia   . PONV (postoperative nausea and vomiting)   . Acute diastolic heart failure 2//[id number]  . Non-ST elevation MI (NSTEMI) 7//2015    Demand ischemia  . HCAP (healthcare-associated pneumonia) 03/03/2014  . Aortic stenosis, mild 7//2015    per echo. EF 55-060%     Allergies  Allergen Reactions  . Bee Venom Hives  . Codeine Palpitations    Per patient, "Feels like I'm having a heart attack."     Current Outpatient Prescriptions  Medication Sig Dispense Refill  . albuterol-ipratropium (COMBIVENT) 18-103 MCG/ACT inhaler Inhale 1 puff into the lungs every 6 (six) hours as needed for wheezing or shortness of breath.      . ALPRAZolam (XANAX) 0.5 MG tablet Take 0.5 mg by mouth 2 (two)  times daily. For anxiety      . aspirin 81 MG chewable tablet Chew 1 tablet (81 mg total) by mouth daily.      Marland Kitchen buPROPion (WELLBUTRIN SR) 150 MG 12 hr tablet Take 150 mg by mouth 2 (two) times daily.      . calcium-vitamin D (OSCAL WITH D) 500-200 MG-UNIT per tablet Take 1 tablet by mouth 2 (two) times daily.      . cholecalciferol (VITAMIN D) 1000 UNITS tablet Take 1,000 Units by mouth daily.      . citalopram (CELEXA) 20 MG tablet Take 20 mg by mouth every morning.       . docusate sodium (COLACE) 100 MG capsule Take 100 mg by mouth 2 (two) times daily.      Marland Kitchen enoxaparin (LOVENOX) 40 MG/0.4ML injection Inject 0.4 mLs (40 mg total) into the skin daily.  12 Syringe  0  . fentaNYL (DURAGESIC - DOSED MCG/HR) 100 MCG/HR Place 1 patch onto the skin every 3 (three) days.      . ferrous sulfate 325 (65 FE) MG tablet Take 325 mg by mouth daily with breakfast.      . furosemide (LASIX) 20 MG tablet Take 3 tablets (60 mg total) by mouth daily. Dosed increased to treat swelling.  90 tablet  3  . gabapentin (NEURONTIN) 300 MG capsule Take 1 capsule by  mouth 3 (three) times daily.      Marland Kitchen guaiFENesin (MUCINEX) 600 MG 12 hr tablet Take 1 tablet (600 mg total) by mouth 2 (two) times daily.      Marland Kitchen ipratropium-albuterol (DUONEB) 0.5-2.5 (3) MG/3ML SOLN Use 3 times daily and then every 2 hours as needed.  360 mL  6  . isosorbide mononitrate (IMDUR) 30 MG 24 hr tablet Take 30 mg by mouth every morning.      Marland Kitchen levofloxacin (LEVAQUIN) 750 MG tablet Take 1 tablet (750 mg total) by mouth daily at 6 PM. Antibiotic to take for 3 more days.  3 tablet  0  . levothyroxine (SYNTHROID, LEVOTHROID) 50 MCG tablet Take 50 mcg by mouth daily before breakfast.      . LORazepam (ATIVAN) 0.5 MG tablet Take 0.5 mg by mouth 3 (three) times daily as needed for anxiety.      . metoprolol succinate (TOPROL-XL) 25 MG 24 hr tablet Take 1 tablet (25 mg total) by mouth daily. New medication for your heart and blood pressure  30 tablet  3  .  Multiple Vitamins-Minerals (MULTIVITAMINS THER. W/MINERALS) TABS tablet Take 1 tablet by mouth daily.      . ondansetron (ZOFRAN) 4 MG tablet Take 4 mg by mouth every 4 (four) hours as needed for nausea or vomiting.      Marland Kitchen oxyCODONE-acetaminophen (PERCOCET/ROXICET) 5-325 MG per tablet Take 1 tablet by mouth every 6 (six) hours as needed for severe pain.      . pantoprazole (PROTONIX) 40 MG tablet Take 40 mg by mouth daily.      . potassium chloride SA (K-DUR,KLOR-CON) 20 MEQ tablet Take 0.5 tablets (10 mEq total) by mouth daily.      . predniSONE (DELTASONE) 10 MG tablet Starting tomorrow on 03/09/14 take 6 tablets daily for one day; then 5 tablets next day; then 4 tablets next day; then 3 tablets next day; then 2 tablets next day; then 1 tablet the next day; then stop.  21 tablet  0  . vitamin B-12 (CYANOCOBALAMIN) 1000 MCG tablet Take 1,000 mcg by mouth every morning.       . vitamin C (ASCORBIC ACID) 500 MG tablet Take 500 mg by mouth daily. For 2 weeks.      Marland Kitchen zinc gluconate 50 MG tablet Take 50 mg by mouth daily.       No current facility-administered medications for this visit.     Past Surgical History  Procedure Laterality Date  . Back surgery      X 2 FOR DISK PROBLEMS. three total back surgery  . Abdominal hysterectomy    . Gastric bypass      HISTORY OF  . Carpal tunnel release      bilateral  . Bladder suspension    . Cholecystectomy    . Colonoscopy, esophagogastroduodenoscopy (egd) and esophageal dilation N/A 01/23/2013    IOE:[id number] hemorrhoids otherwise normal colonoscopy/Mild changes of esophagitis at GE junction(focal erythema).Small gastric pouch with patent gastrojejunostomy.Normal mucosa of proximal small bowel  . Givens capsule study N/A 01/24/2013    Procedure: GIVENS CAPSULE STUDY;  Surgeon: Rogene Houston, MD;  Location: [facility];  Service: Endoscopy;  Laterality: N/A;  . Hip arthroplasty Right 08/29/2013    Procedure: HEMI ARTHROPLASTY right hip;  Surgeon:  Mauri Pole, MD;  Location: [facility];  Service: Orthopedics;  Laterality: Right;     Allergies  Allergen Reactions  . Bee Venom Hives  . Codeine Palpitations  Per patient, "Feels like I'm having a heart attack."      Family History  Problem Relation Age of Onset  . Coronary artery disease      FAMILY HISTORY  . Diabetes type II Mother   . Heart disease Mother   . Lymphoma Father   . Colon cancer Sister     late 19s     Social History [name] [name] reports that she has never smoked. She has never used smokeless tobacco. [name] [name] reports that she does not drink alcohol.   Review of Systems CONSTITUTIONAL: No weight loss, fever, chills, weakness or fatigue.  HEENT: Eyes: No visual loss, blurred vision, double vision or yellow sclerae.No hearing loss, sneezing, congestion, runny nose or sore throat.  SKIN: No rash or itching.  CARDIOVASCULAR:  RESPIRATORY: No shortness of breath, cough or sputum.  GASTROINTESTINAL: No anorexia, nausea, vomiting or diarrhea. No abdominal pain or blood.  GENITOURINARY: No burning on urination, no polyuria NEUROLOGICAL: No headache, dizziness, syncope, paralysis, ataxia, numbness or tingling in the extremities. No change in bowel or bladder control.  MUSCULOSKELETAL: No muscle, back pain, joint pain or stiffness.  LYMPHATICS: No enlarged nodes. No history of splenectomy.  PSYCHIATRIC: No history of depression or anxiety.  ENDOCRINOLOGIC: No reports of sweating, cold or heat intolerance. No polyuria or polydipsia.  Marland Kitchen   Physical Examination There were no vitals filed for this visit. There were no vitals filed for this visit.  Gen: resting comfortably, no acute distress HEENT: no scleral icterus, pupils equal round and reactive, no palptable cervical adenopathy,  CV Resp: Clear to auscultation bilaterally GI: abdomen is soft, non-tender, non-distended, normal bowel sounds, no hepatosplenomegaly MSK: extremities are warm, no edema.    Skin: warm, no rash Neuro:  no focal deficits Psych: appropriate affect   Diagnostic Studies  10/2006 cath  RESULTS:  HEMODYNAMICS: LV 186/22, AO 165/115.  CORONARIES: The left main was normal. The LAD had luminal  irregularities. First diagonal was moderate and normal. The circumflex  in the AV groove was normal. First obtuse marginal was moderate and  normal. Second obtuse marginal was large and normal. Posterolateral  was large and normal. The right coronary artery was dominant. There  was proximal 25% stenosis. The PDA was of moderate size and normal.  The posterolateral was small x2 and normal.  LEFT VENTRICULOGRAM: The left ventriculogram was obtained in the RAO  projection. The EF was 65%.  CONCLUSION: Minimal coronary plaque. Normal left ventricular function.  There did appear to be left ventricular outflow gradient. However,  there was significant ventricular ectopy. This needs to be put into  clinical context, as there does not appear to be significant valvular  abnormality or other outflow obstruction per Dr. Arlina Robes note.     Assessment and Plan        Arnoldo Lenis, M.D., F.A.C.C.

## 2014-05-20 ENCOUNTER — Other Ambulatory Visit: Payer: Self-pay | Admitting: Gastroenterology

## 2014-07-02 IMAGING — CR DG CHEST 1V PORT
1 series · 1 of 1 positions shown · non-contrast
Comparison: 07/16/2013, 06/28/2013 and chest CT 05/10/2013

CLINICAL DATA: 72-year-old hypoxia.

EXAM:
PORTABLE CHEST - 1 VIEW

[AP]
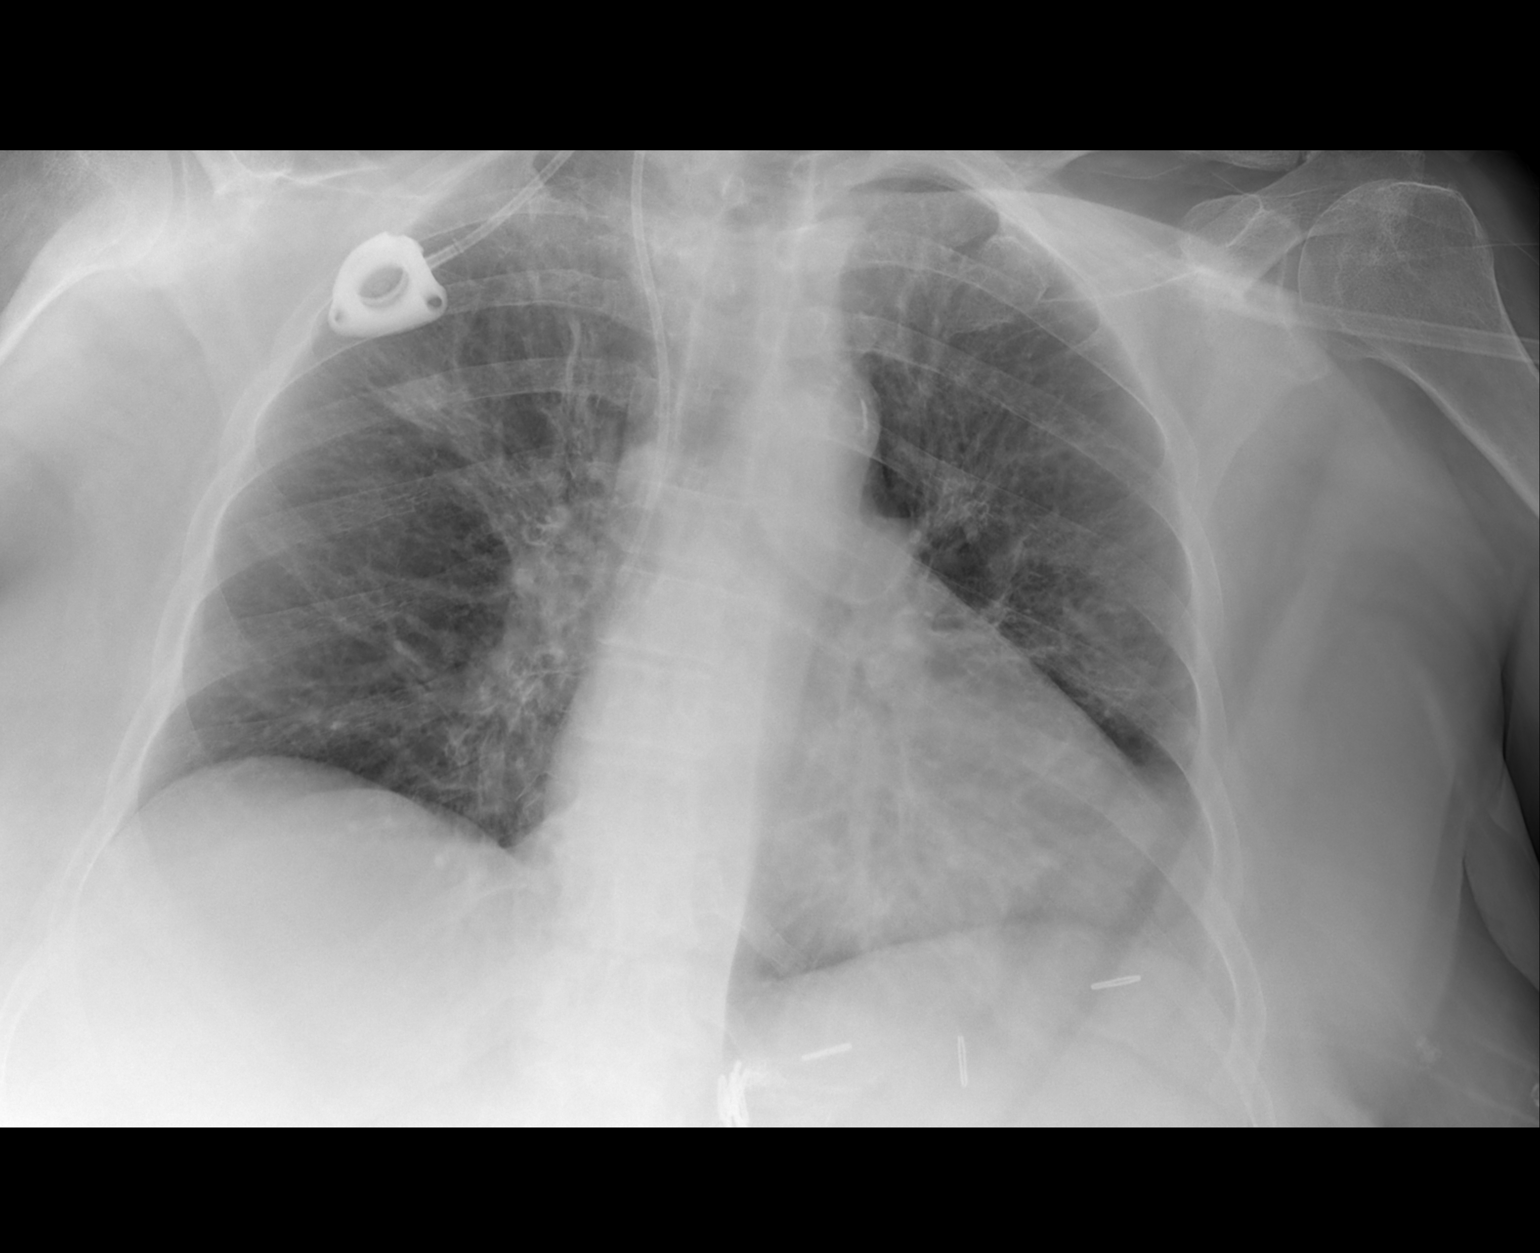

[1 of 1 positions shown; findings below may reference images not displayed]

FINDINGS: Port-A-Cath tip in the SVC region. Again noted are patchy densities
in the right upper lung. These densities have not significantly
changed since 06/28/2013 and may be related to known lung lesions.
Again noted are coarse lung markings and cannot exclude small lung
lesions based on previous CT findings. Heart size is within normal
limits.
IMPRESSION: Patchy lung densities, particularly in the right upper lung.
Findings could be related to known lung lesions. Difficult to
exclude acute on chronic disease. Lungs may be better characterized
with a chest CT.

## 2014-07-02 IMAGING — CR DG PORTABLE PELVIS
1 series · 2 of 2 positions shown · non-contrast
Comparison: 08/28/2013

CLINICAL DATA: Postop hip surgery.

EXAM:
PORTABLE PELVIS 1-2 VIEWS

[Series 1: AP · U · 2 of 2 slices shown]
[im 1/2]
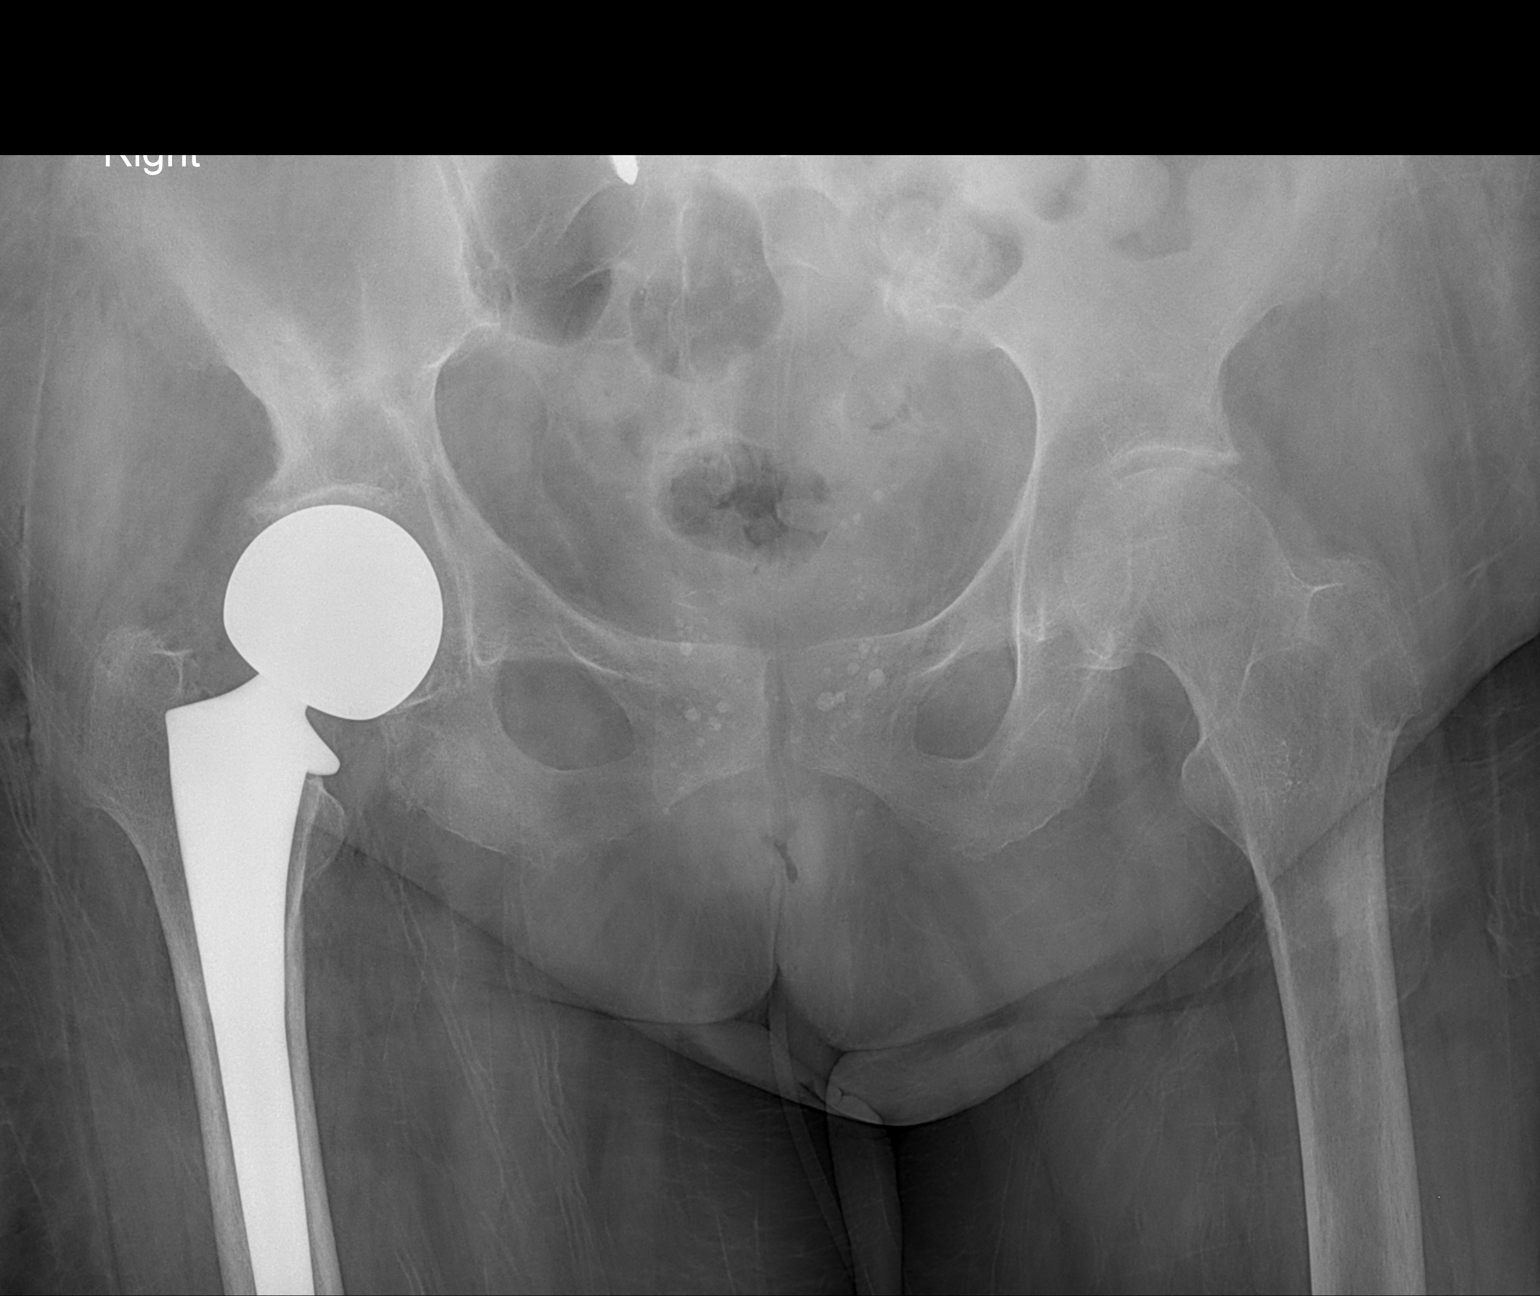
[im 2/2]
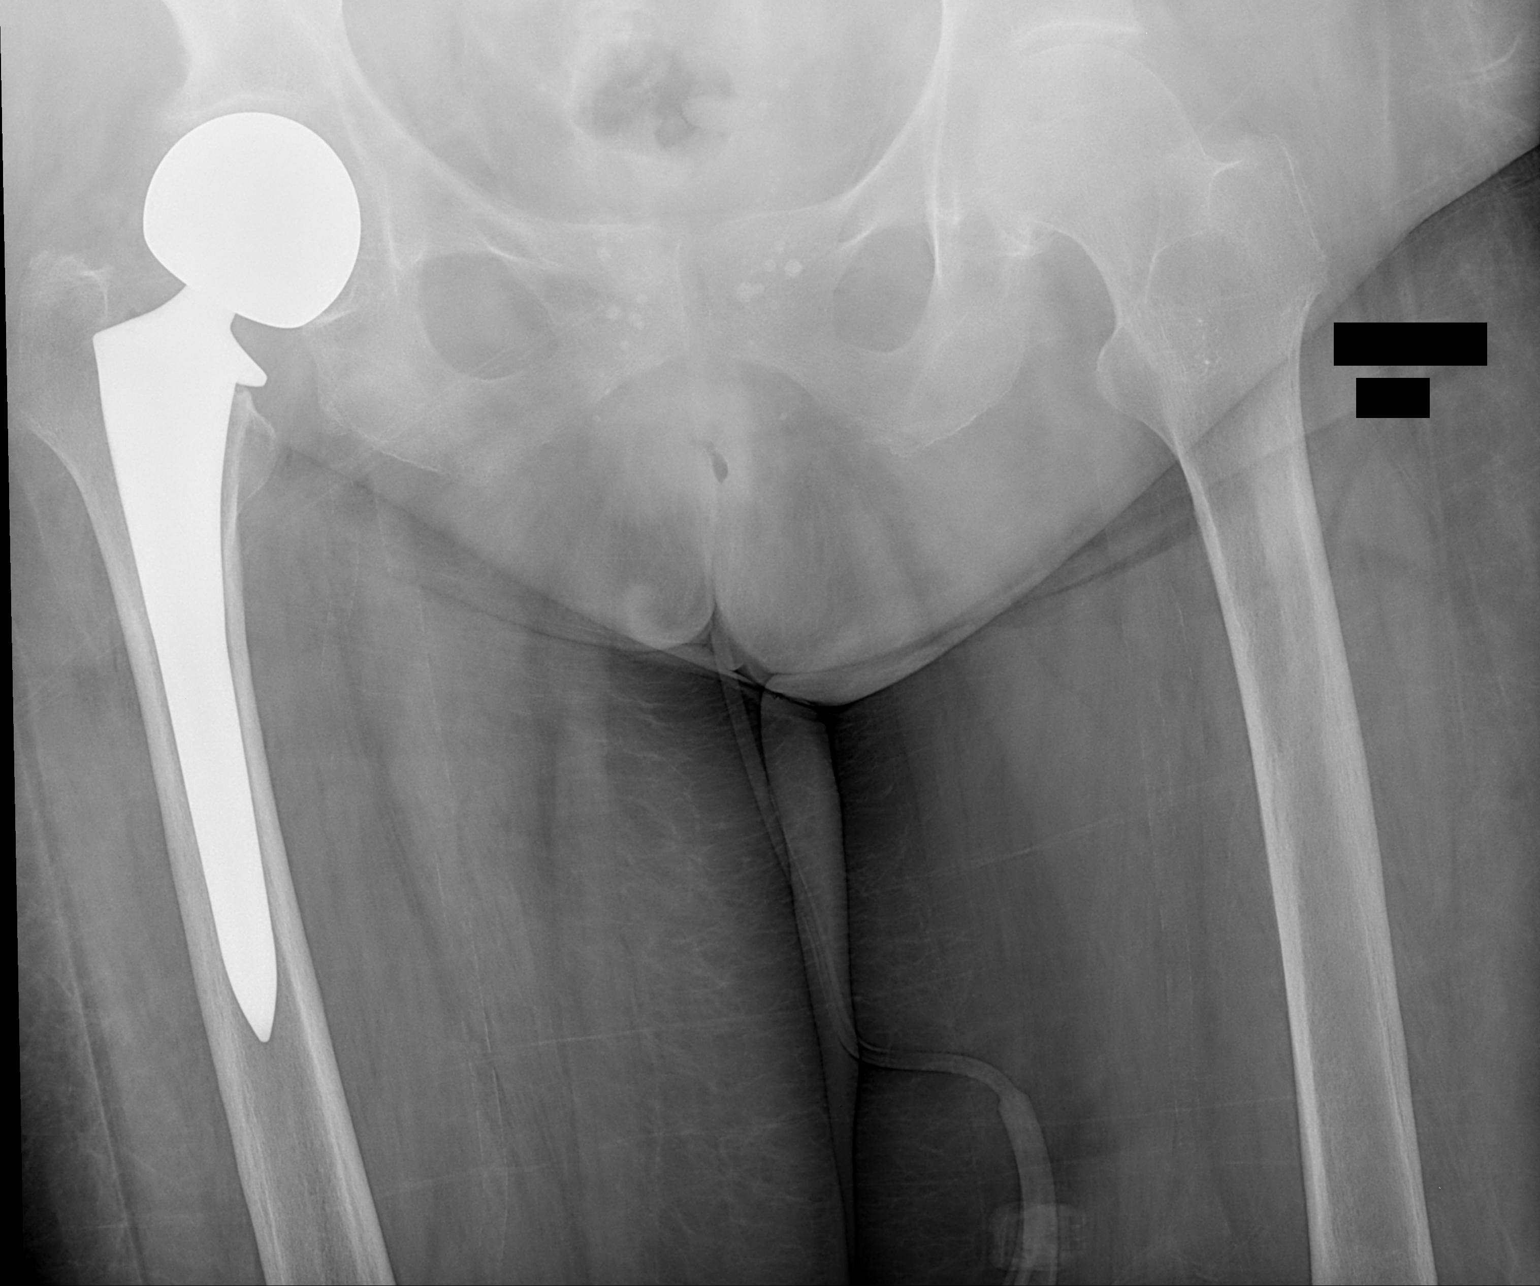

[2 of 2 positions shown; findings below may reference images not displayed]

FINDINGS: A right hip arthroplasty has been performed. The hip arthroplasty
appears to be grossly located on these frontal images. Pelvic bony
ring is intact. Postsurgical changes at the lumbosacral junction. No
gross abnormality to the left hip.
IMPRESSION: Right hip arthroplasty without complicating features.

## 2014-07-02 IMAGING — US US ABDOMEN COMPLETE
1 series · 13 of 25 positions shown · non-contrast
Comparison: None.

CLINICAL DATA: Diffuse left-sided abdominal discomfort, history of
cholecystectomy and hysterectomy. Also history of diabetes in
lymphoma

EXAM:
ULTRASOUND ABDOMEN COMPLETE

[Series 1: us abdomen complete · 0.21mm/px · 13 of 52 slices shown]
[im 1/52]
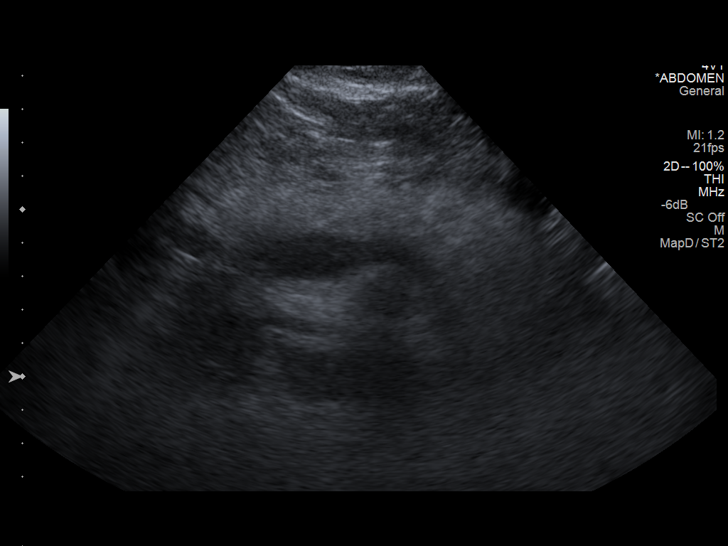
[im 5/52]
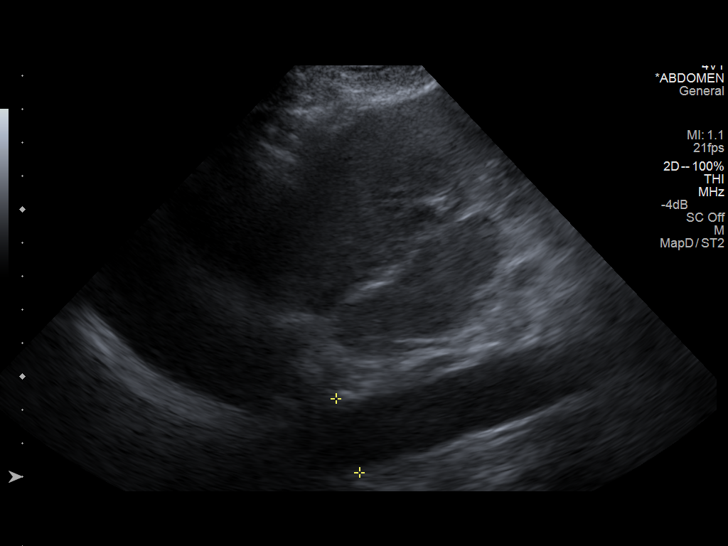
[im 9/52]
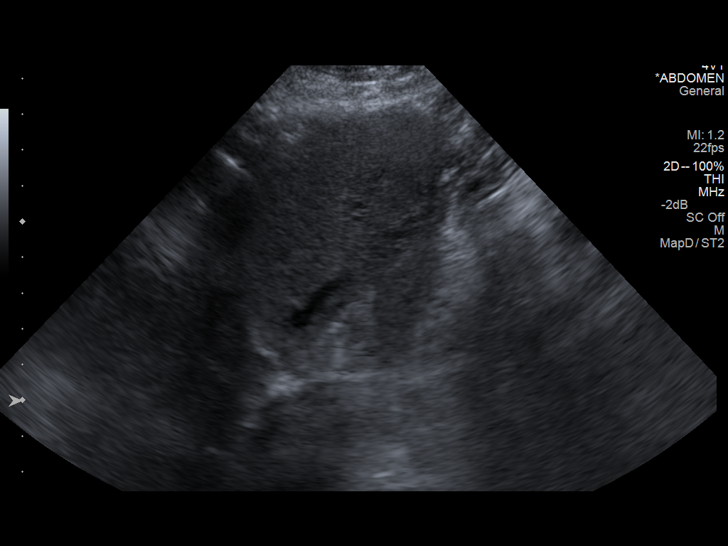
[im 13/52]
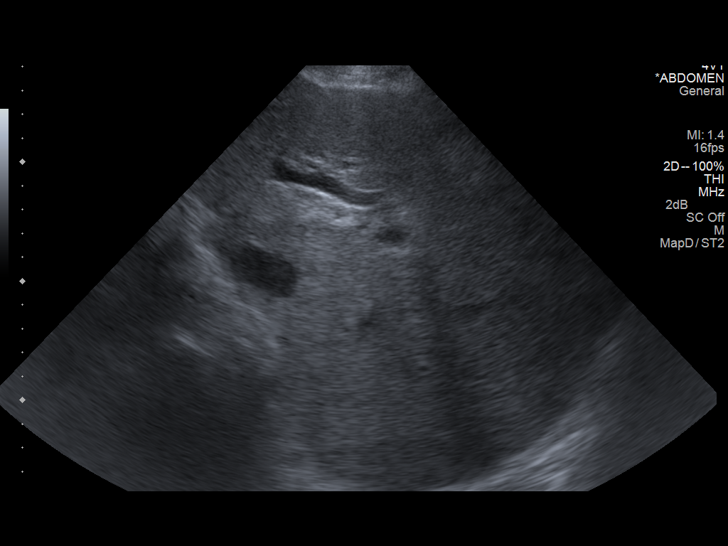
[im 18/52]
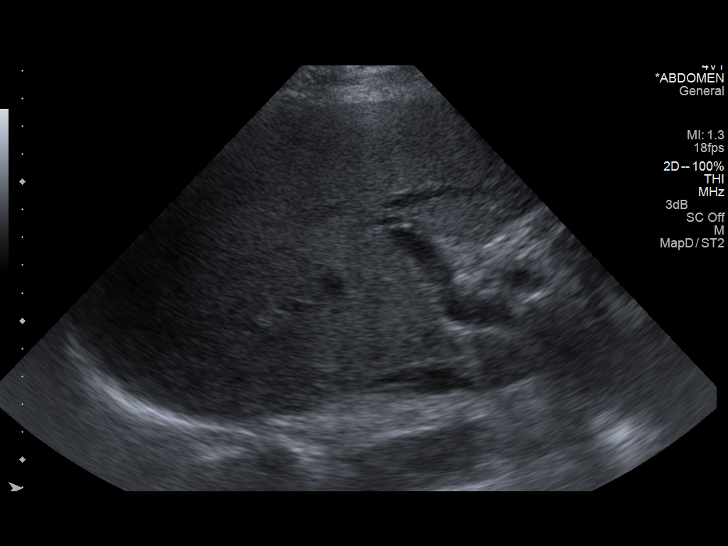
[im 22/52]
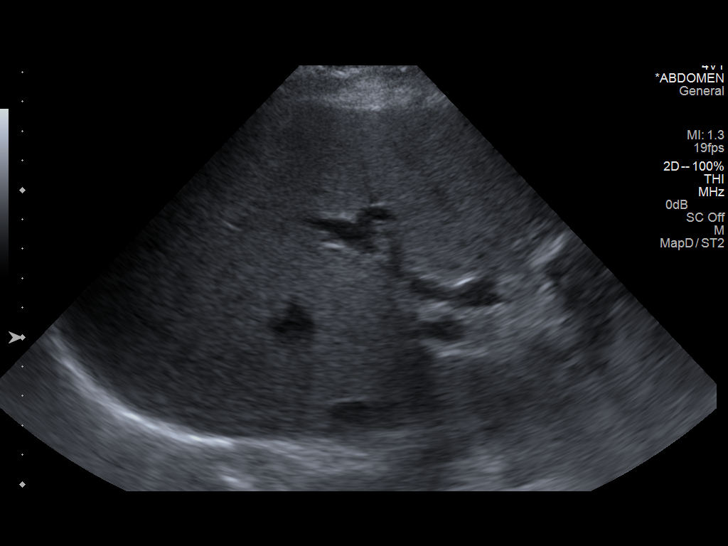
[im 26/52]
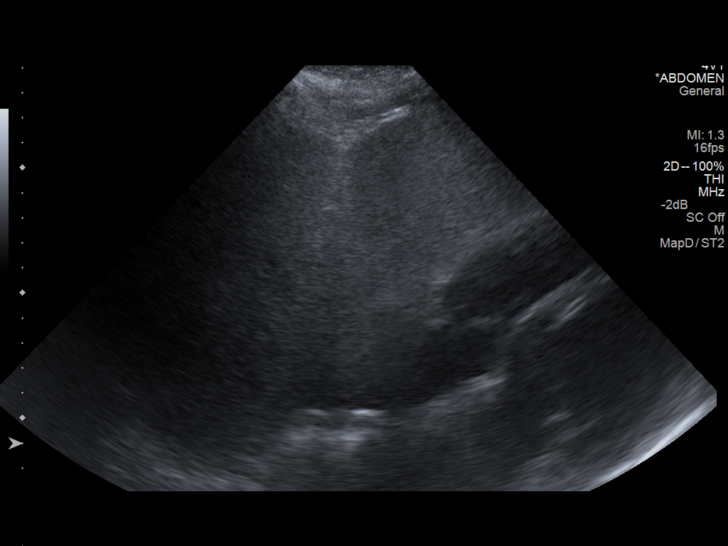
[im 30/52]
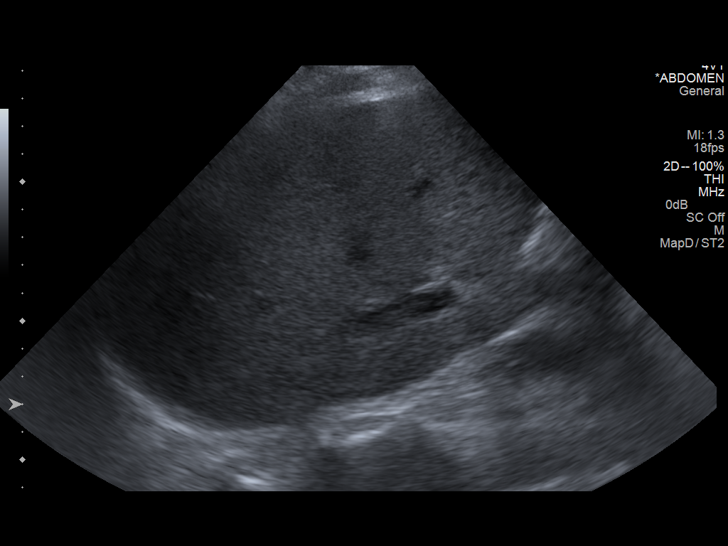
[im 35/52]
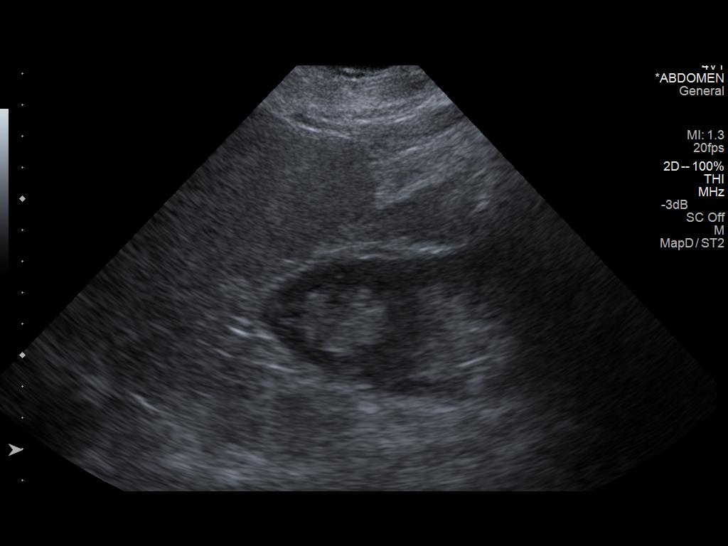
[im 39/52]
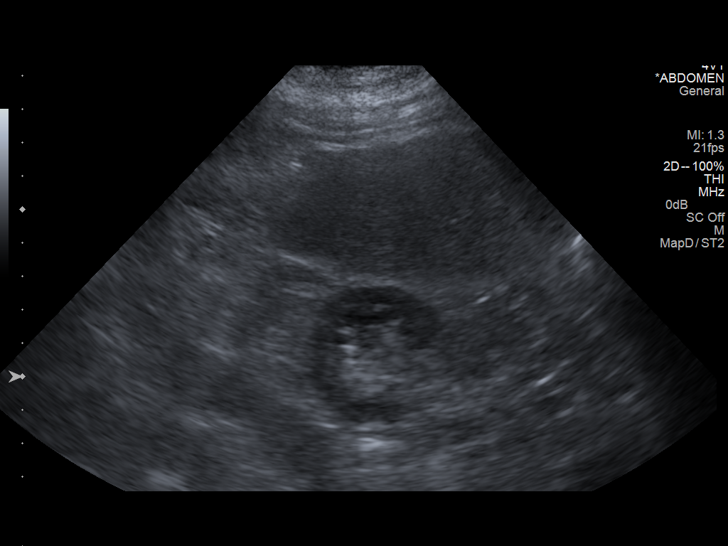
[im 43/52]
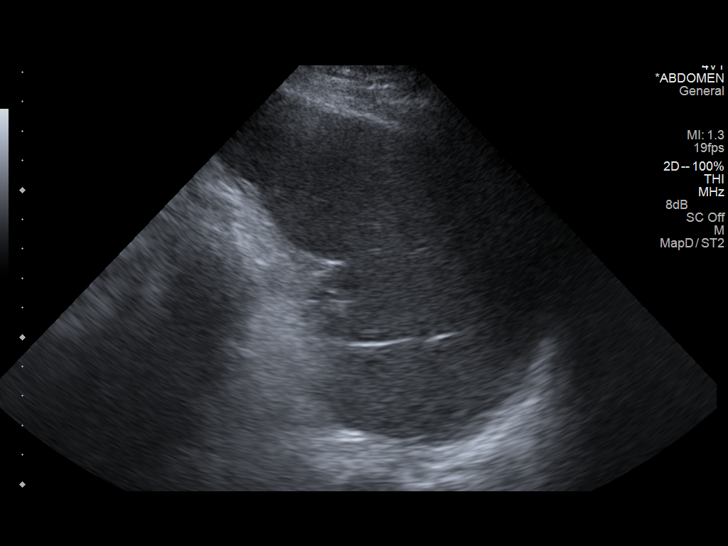
[im 47/52]
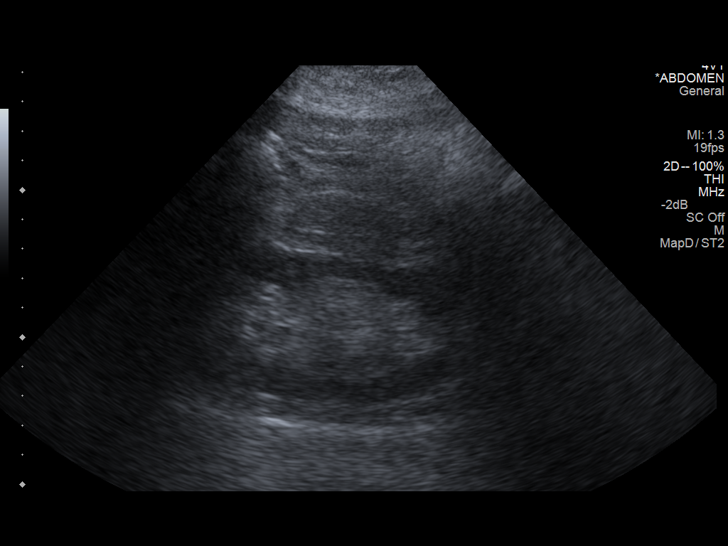
[im 52/52]
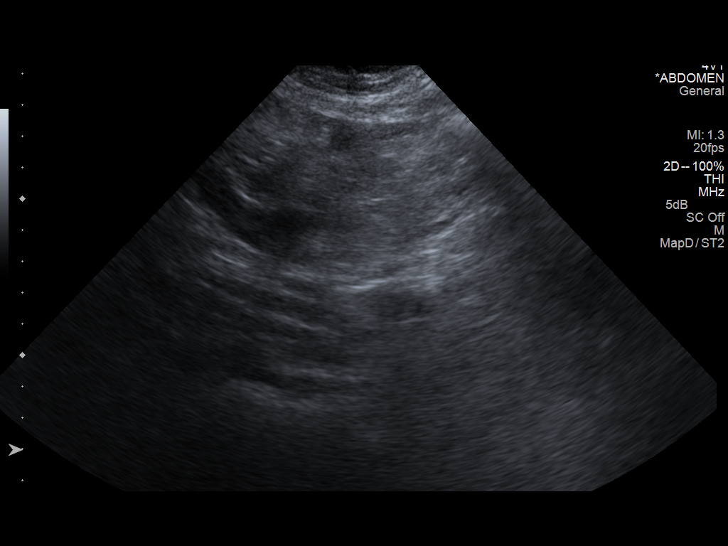

[13 of 25 positions shown; findings below may reference images not displayed]

FINDINGS: Gallbladder:

The gallbladder is surgically absent. There are no abnormal findings
in the gallbladder fossa.

Common bile duct:

Diameter: 8.9 mm which is not unusual in the post cholecystectomy
state.

Liver:

The echotexture of the liver is mildly increased. There is no focal
mass. No intrahepatic ductal dilation is demonstrated. Portal venous
flow remains normal in direction toward the liver.

IVC:

No abnormality visualized.

Pancreas:

Visualized portion unremarkable.

Spleen:

The spleen exhibits normal echotexture and measures 8.4 cm in
length.

Right Kidney:

Length: 10.4 cm. There is no hydronephrosis. Subjective mild
thinning of the cortex is present. There is no focal mass

Left Kidney:

Length: 10.7 cm. There is no evidence of hydronephrosis or focal
mass. The cortical echotexture appears normal but there is mild
cortical thinning.

Abdominal aorta:

No aneurysm visualized.

Other findings:

No ascites is demonstrated.
IMPRESSION: 1. The gallbladder is surgically absent. No acute abnormality of the
liver or pancreas or spleen is demonstrated.
2. The kidneys exhibit no acute abnormalities. There is mild
cortical atrophy diffusely.
3. No ascites is demonstrated.

## 2014-08-25 ENCOUNTER — Encounter (HOSPITAL_COMMUNITY): Payer: Self-pay | Admitting: Internal Medicine

## 2015-01-04 IMAGING — CR DG CHEST 1V PORT
1 series · 1 of 1 positions shown · non-contrast
Comparison: 02/26/2014

CLINICAL DATA: Pneumonia and CHF. Shortness of breath. Mild chest
pressure.

EXAM:
PORTABLE CHEST - 1 VIEW

[portable]
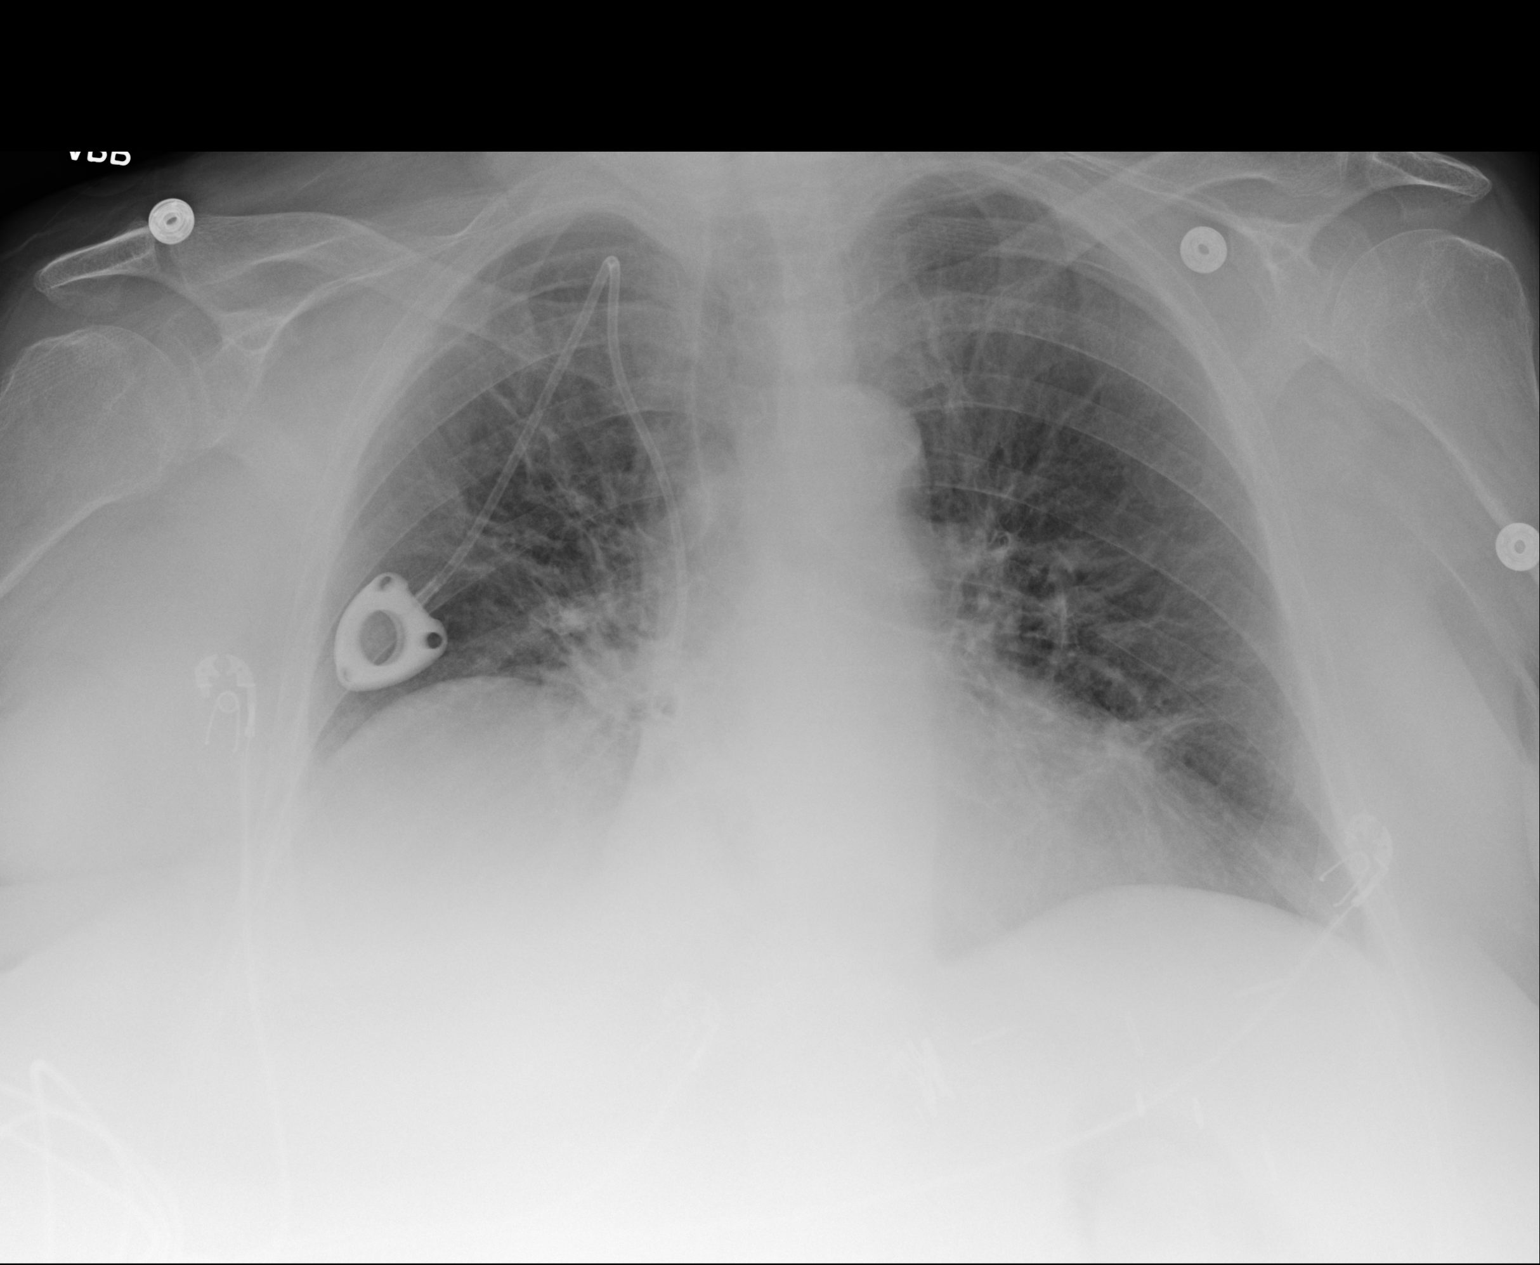

[1 of 1 positions shown; findings below may reference images not displayed]

FINDINGS: Right jugular Port-A-Cath remains in place with tip overlying the
lower SVC, unchanged. Cardiac silhouette remains mildly enlarged.
The lungs are hypoinflated with increased elevation of the right
hemidiaphragm. There is mild pulmonary vascular congestion. Mild
interstitial edema on the prior study has improved. There is no
evidence of confluent airspace opacity, pleural effusion, or
pneumothorax. Multiple surgical clips are noted in the left upper
abdomen.
IMPRESSION: Shallow inspiration with increased elevation the right
hemidiaphragm. Mild pulmonary vascular congestion with improvement
of mild interstitial edema.

## 2015-03-13 DEATH — deceased
